# Patient Record
Sex: Female | Born: 1960
Health system: Southern US, Community
[De-identification: ages and names within clinical notes are randomized; demographics above are authoritative.]

## PROBLEM LIST (undated history)

## (undated) DIAGNOSIS — Z9889 Other specified postprocedural states: Secondary | ICD-10-CM

## (undated) DIAGNOSIS — I341 Nonrheumatic mitral (valve) prolapse: Secondary | ICD-10-CM

## (undated) DIAGNOSIS — R112 Nausea with vomiting, unspecified: Secondary | ICD-10-CM

## (undated) DIAGNOSIS — E739 Lactose intolerance, unspecified: Secondary | ICD-10-CM

## (undated) DIAGNOSIS — J309 Allergic rhinitis, unspecified: Secondary | ICD-10-CM

## (undated) DIAGNOSIS — M869 Osteomyelitis, unspecified: Secondary | ICD-10-CM

## (undated) DIAGNOSIS — E7800 Pure hypercholesterolemia, unspecified: Secondary | ICD-10-CM

## (undated) DIAGNOSIS — K219 Gastro-esophageal reflux disease without esophagitis: Secondary | ICD-10-CM

## (undated) DIAGNOSIS — K5900 Constipation, unspecified: Secondary | ICD-10-CM

## (undated) DIAGNOSIS — Z789 Other specified health status: Secondary | ICD-10-CM

## (undated) DIAGNOSIS — R1310 Dysphagia, unspecified: Secondary | ICD-10-CM

## (undated) DIAGNOSIS — D219 Benign neoplasm of connective and other soft tissue, unspecified: Secondary | ICD-10-CM

## (undated) DIAGNOSIS — R079 Chest pain, unspecified: Secondary | ICD-10-CM

## (undated) DIAGNOSIS — T4145XA Adverse effect of unspecified anesthetic, initial encounter: Secondary | ICD-10-CM

## (undated) DIAGNOSIS — R0602 Shortness of breath: Secondary | ICD-10-CM

## (undated) DIAGNOSIS — M7989 Other specified soft tissue disorders: Secondary | ICD-10-CM

## (undated) DIAGNOSIS — T8859XA Other complications of anesthesia, initial encounter: Secondary | ICD-10-CM

## (undated) DIAGNOSIS — M519 Unspecified thoracic, thoracolumbar and lumbosacral intervertebral disc disorder: Secondary | ICD-10-CM

## (undated) DIAGNOSIS — E559 Vitamin D deficiency, unspecified: Secondary | ICD-10-CM

## (undated) DIAGNOSIS — R011 Cardiac murmur, unspecified: Secondary | ICD-10-CM

## (undated) DIAGNOSIS — Z91018 Allergy to other foods: Secondary | ICD-10-CM

## (undated) DIAGNOSIS — G40909 Epilepsy, unspecified, not intractable, without status epilepticus: Secondary | ICD-10-CM

## (undated) DIAGNOSIS — G4700 Insomnia, unspecified: Secondary | ICD-10-CM

## (undated) DIAGNOSIS — D649 Anemia, unspecified: Secondary | ICD-10-CM

## (undated) HISTORY — PX: COLONOSCOPY: SHX174

## (undated) HISTORY — DX: Epilepsy, unspecified, not intractable, without status epilepticus: G40.909

## (undated) HISTORY — DX: Unspecified thoracic, thoracolumbar and lumbosacral intervertebral disc disorder: M51.9

## (undated) HISTORY — PX: DILATION AND CURETTAGE OF UTERUS: SHX78

## (undated) HISTORY — DX: Other specified soft tissue disorders: M79.89

## (undated) HISTORY — DX: Dysphagia, unspecified: R13.10

## (undated) HISTORY — DX: Lactose intolerance, unspecified: E73.9

## (undated) HISTORY — PX: ABDOMINAL HYSTERECTOMY: SHX81

## (undated) HISTORY — DX: Allergy to other foods: Z91.018

## (undated) HISTORY — DX: Constipation, unspecified: K59.00

## (undated) HISTORY — DX: Insomnia, unspecified: G47.00

## (undated) HISTORY — DX: Pure hypercholesterolemia, unspecified: E78.00

## (undated) HISTORY — DX: Chest pain, unspecified: R07.9

## (undated) HISTORY — DX: Anemia, unspecified: D64.9

## (undated) HISTORY — DX: Shortness of breath: R06.02

## (undated) HISTORY — DX: Vitamin D deficiency, unspecified: E55.9

## (undated) HISTORY — PX: HEMORRHOID SURGERY: SHX153

---
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
## 1998-04-11 ENCOUNTER — Ambulatory Visit (HOSPITAL_COMMUNITY): Admission: RE | Admit: 1998-04-11 | Discharge: 1998-04-11 | Payer: Self-pay | Admitting: Gastroenterology

## 1998-05-02 ENCOUNTER — Ambulatory Visit (HOSPITAL_COMMUNITY): Admission: RE | Admit: 1998-05-02 | Discharge: 1998-05-02 | Payer: Self-pay | Admitting: Gastroenterology

## 1998-06-14 ENCOUNTER — Ambulatory Visit (HOSPITAL_COMMUNITY): Admission: RE | Admit: 1998-06-14 | Discharge: 1998-06-15 | Payer: Self-pay | Admitting: General Surgery

## 1998-08-02 ENCOUNTER — Inpatient Hospital Stay (HOSPITAL_COMMUNITY): Admission: EM | Admit: 1998-08-02 | Discharge: 1998-08-04 | Payer: Self-pay | Admitting: Emergency Medicine

## 1998-08-04 ENCOUNTER — Encounter: Payer: Self-pay | Admitting: Emergency Medicine

## 1999-09-06 ENCOUNTER — Other Ambulatory Visit: Admission: RE | Admit: 1999-09-06 | Discharge: 1999-09-06 | Payer: Self-pay | Admitting: *Deleted

## 1999-11-23 ENCOUNTER — Ambulatory Visit (HOSPITAL_COMMUNITY): Admission: RE | Admit: 1999-11-23 | Discharge: 1999-11-23 | Payer: Self-pay | Admitting: *Deleted

## 2000-03-31 ENCOUNTER — Encounter: Payer: Self-pay | Admitting: Neurosurgery

## 2000-03-31 ENCOUNTER — Ambulatory Visit (HOSPITAL_COMMUNITY): Admission: RE | Admit: 2000-03-31 | Discharge: 2000-03-31 | Payer: Self-pay | Admitting: Neurosurgery

## 2000-04-09 ENCOUNTER — Encounter: Admission: RE | Admit: 2000-04-09 | Discharge: 2000-05-30 | Payer: Self-pay | Admitting: Neurosurgery

## 2002-12-16 ENCOUNTER — Encounter: Payer: Self-pay | Admitting: Gastroenterology

## 2002-12-16 ENCOUNTER — Ambulatory Visit (HOSPITAL_COMMUNITY): Admission: RE | Admit: 2002-12-16 | Discharge: 2002-12-16 | Payer: Self-pay | Admitting: Gastroenterology

## 2002-12-18 ENCOUNTER — Ambulatory Visit (HOSPITAL_COMMUNITY): Admission: RE | Admit: 2002-12-18 | Discharge: 2002-12-18 | Payer: Self-pay | Admitting: Gastroenterology

## 2002-12-18 ENCOUNTER — Encounter: Payer: Self-pay | Admitting: Gastroenterology

## 2003-02-24 ENCOUNTER — Ambulatory Visit (HOSPITAL_COMMUNITY): Admission: RE | Admit: 2003-02-24 | Discharge: 2003-02-24 | Payer: Self-pay | Admitting: Family Medicine

## 2003-02-24 ENCOUNTER — Encounter: Payer: Self-pay | Admitting: Family Medicine

## 2003-09-08 ENCOUNTER — Encounter: Admission: RE | Admit: 2003-09-08 | Discharge: 2003-09-08 | Payer: Self-pay | Admitting: General Surgery

## 2004-10-20 ENCOUNTER — Encounter: Admission: RE | Admit: 2004-10-20 | Discharge: 2004-10-20 | Payer: Self-pay | Admitting: General Surgery

## 2005-11-07 ENCOUNTER — Encounter: Admission: RE | Admit: 2005-11-07 | Discharge: 2005-11-07 | Payer: Self-pay | Admitting: General Surgery

## 2005-11-29 ENCOUNTER — Encounter: Admission: RE | Admit: 2005-11-29 | Discharge: 2005-11-29 | Payer: Self-pay | Admitting: General Surgery

## 2005-12-05 ENCOUNTER — Emergency Department (HOSPITAL_COMMUNITY): Admission: EM | Admit: 2005-12-05 | Discharge: 2005-12-05 | Payer: Self-pay | Admitting: Family Medicine

## 2006-02-13 ENCOUNTER — Encounter: Payer: Self-pay | Admitting: Emergency Medicine

## 2006-05-28 ENCOUNTER — Emergency Department (HOSPITAL_COMMUNITY): Admission: AD | Admit: 2006-05-28 | Discharge: 2006-05-28 | Payer: Self-pay | Admitting: Family Medicine

## 2006-12-04 ENCOUNTER — Encounter: Admission: RE | Admit: 2006-12-04 | Discharge: 2006-12-04 | Payer: Self-pay | Admitting: General Surgery

## 2008-01-08 ENCOUNTER — Encounter: Admission: RE | Admit: 2008-01-08 | Discharge: 2008-01-08 | Payer: Self-pay | Admitting: General Surgery

## 2008-03-05 ENCOUNTER — Encounter: Admission: RE | Admit: 2008-03-05 | Discharge: 2008-03-05 | Payer: Self-pay | Admitting: Obstetrics & Gynecology

## 2009-03-15 ENCOUNTER — Encounter: Admission: RE | Admit: 2009-03-15 | Discharge: 2009-03-15 | Payer: Self-pay | Admitting: Obstetrics & Gynecology

## 2010-11-05 ENCOUNTER — Encounter: Payer: Self-pay | Admitting: Obstetrics & Gynecology

## 2011-07-06 ENCOUNTER — Ambulatory Visit: Payer: Self-pay | Admitting: Internal Medicine

## 2011-08-10 ENCOUNTER — Other Ambulatory Visit (HOSPITAL_COMMUNITY): Payer: Self-pay | Admitting: Sports Medicine

## 2011-08-10 DIAGNOSIS — M79671 Pain in right foot: Secondary | ICD-10-CM

## 2011-08-17 ENCOUNTER — Ambulatory Visit (HOSPITAL_COMMUNITY)
Admission: RE | Admit: 2011-08-17 | Discharge: 2011-08-17 | Disposition: A | Discharge status: Home / Self Care | Payer: 59 | Source: Ambulatory Visit | Attending: Sports Medicine | Admitting: Sports Medicine

## 2011-08-17 DIAGNOSIS — M79609 Pain in unspecified limb: Secondary | ICD-10-CM | POA: Insufficient documentation

## 2011-08-17 DIAGNOSIS — M7989 Other specified soft tissue disorders: Secondary | ICD-10-CM | POA: Insufficient documentation

## 2011-08-17 DIAGNOSIS — M79671 Pain in right foot: Secondary | ICD-10-CM

## 2011-09-18 ENCOUNTER — Other Ambulatory Visit (HOSPITAL_COMMUNITY): Payer: Self-pay | Admitting: Orthopedic Surgery

## 2011-09-18 DIAGNOSIS — M25579 Pain in unspecified ankle and joints of unspecified foot: Secondary | ICD-10-CM

## 2011-09-20 ENCOUNTER — Other Ambulatory Visit (HOSPITAL_COMMUNITY): Payer: 59

## 2011-09-24 ENCOUNTER — Ambulatory Visit (HOSPITAL_COMMUNITY)
Admission: RE | Admit: 2011-09-24 | Discharge: 2011-09-24 | Disposition: A | Discharge status: Home / Self Care | Payer: 59 | Source: Ambulatory Visit | Attending: Orthopedic Surgery | Admitting: Orthopedic Surgery

## 2011-09-24 DIAGNOSIS — M25476 Effusion, unspecified foot: Secondary | ICD-10-CM | POA: Insufficient documentation

## 2011-09-24 DIAGNOSIS — M25473 Effusion, unspecified ankle: Secondary | ICD-10-CM | POA: Insufficient documentation

## 2011-09-24 DIAGNOSIS — M25579 Pain in unspecified ankle and joints of unspecified foot: Secondary | ICD-10-CM | POA: Insufficient documentation

## 2011-10-30 ENCOUNTER — Other Ambulatory Visit: Payer: Self-pay | Admitting: Physician Assistant

## 2011-10-30 ENCOUNTER — Encounter (HOSPITAL_BASED_OUTPATIENT_CLINIC_OR_DEPARTMENT_OTHER): Payer: Self-pay | Admitting: *Deleted

## 2011-10-30 NOTE — Progress Notes (Signed)
Used to work or-now telemetry No labs needed

## 2011-10-31 ENCOUNTER — Encounter (HOSPITAL_BASED_OUTPATIENT_CLINIC_OR_DEPARTMENT_OTHER): Payer: Self-pay | Admitting: Anesthesiology

## 2011-10-31 ENCOUNTER — Encounter (HOSPITAL_BASED_OUTPATIENT_CLINIC_OR_DEPARTMENT_OTHER): Payer: Self-pay | Admitting: *Deleted

## 2011-10-31 ENCOUNTER — Ambulatory Visit (HOSPITAL_BASED_OUTPATIENT_CLINIC_OR_DEPARTMENT_OTHER)
Admission: RE | Admit: 2011-10-31 | Discharge: 2011-10-31 | Disposition: A | Discharge status: Home / Self Care | Payer: 59 | Source: Ambulatory Visit | Attending: Orthopedic Surgery | Admitting: Orthopedic Surgery

## 2011-10-31 ENCOUNTER — Encounter (HOSPITAL_BASED_OUTPATIENT_CLINIC_OR_DEPARTMENT_OTHER)
Admission: RE | Disposition: A | Discharge status: Home / Self Care | Payer: Self-pay | Source: Ambulatory Visit | Attending: Orthopedic Surgery

## 2011-10-31 ENCOUNTER — Ambulatory Visit (HOSPITAL_BASED_OUTPATIENT_CLINIC_OR_DEPARTMENT_OTHER): Payer: 59 | Admitting: Anesthesiology

## 2011-10-31 DIAGNOSIS — M25571 Pain in right ankle and joints of right foot: Secondary | ICD-10-CM

## 2011-10-31 DIAGNOSIS — M25879 Other specified joint disorders, unspecified ankle and foot: Secondary | ICD-10-CM | POA: Insufficient documentation

## 2011-10-31 HISTORY — PX: ANKLE ARTHROSCOPY: SHX545

## 2011-10-31 HISTORY — DX: Other specified health status: Z78.9

## 2011-10-31 HISTORY — DX: Other complications of anesthesia, initial encounter: T88.59XA

## 2011-10-31 HISTORY — DX: Nausea with vomiting, unspecified: Z98.890

## 2011-10-31 HISTORY — DX: Nausea with vomiting, unspecified: R11.2

## 2011-10-31 HISTORY — DX: Adverse effect of unspecified anesthetic, initial encounter: T41.45XA

## 2011-10-31 LAB — POCT HEMOGLOBIN-HEMACUE: Hemoglobin: 11.9 g/dL — ABNORMAL LOW (ref 12.0–15.0)

## 2011-10-31 SURGERY — ARTHROSCOPY, ANKLE
Anesthesia: General | Site: Ankle | Laterality: Right | Wound class: Clean

## 2011-10-31 MED ORDER — CEFAZOLIN SODIUM 1-5 GM-% IV SOLN
1.0000 g | INTRAVENOUS | Status: AC
  Administered 2011-10-31: 1 g via INTRAVENOUS

## 2011-10-31 MED ORDER — LIDOCAINE HCL 1.5 % IJ SOLN
INTRAMUSCULAR | Status: DC | PRN
  Administered 2011-10-31: 25 mL via INTRADERMAL

## 2011-10-31 MED ORDER — ONDANSETRON HCL 4 MG/2ML IJ SOLN
INTRAMUSCULAR | Status: DC | PRN
  Administered 2011-10-31: 4 mg via INTRAVENOUS

## 2011-10-31 MED ORDER — ASPIRIN EC 325 MG PO TBEC: 325.0000 mg | DELAYED_RELEASE_TABLET | Freq: Two times a day (BID) | ORAL | Status: AC

## 2011-10-31 MED ORDER — ROPIVACAINE HCL 5 MG/ML IJ SOLN
INTRAMUSCULAR | Status: DC | PRN
  Administered 2011-10-31: 25 mL via EPIDURAL

## 2011-10-31 MED ORDER — MIDAZOLAM HCL 2 MG/2ML IJ SOLN
0.5000 mg | INTRAMUSCULAR | Status: DC | PRN
  Administered 2011-10-31: 2 mg via INTRAVENOUS

## 2011-10-31 MED ORDER — PROPOFOL 10 MG/ML IV EMUL
INTRAVENOUS | Status: DC | PRN
  Administered 2011-10-31: 200 mg via INTRAVENOUS

## 2011-10-31 MED ORDER — MIDAZOLAM HCL 5 MG/5ML IJ SOLN
INTRAMUSCULAR | Status: DC | PRN
  Administered 2011-10-31: 2 mg via INTRAVENOUS

## 2011-10-31 MED ORDER — METOCLOPRAMIDE HCL 5 MG/ML IJ SOLN: 10.0000 mg | Freq: Once | INTRAMUSCULAR | Status: DC | PRN

## 2011-10-31 MED ORDER — FENTANYL CITRATE 0.05 MG/ML IJ SOLN
INTRAMUSCULAR | Status: DC | PRN
  Administered 2011-10-31: 50 ug via INTRAVENOUS

## 2011-10-31 MED ORDER — DROPERIDOL 2.5 MG/ML IJ SOLN
INTRAMUSCULAR | Status: DC | PRN
  Administered 2011-10-31: 0.625 mg via INTRAVENOUS

## 2011-10-31 MED ORDER — SODIUM CHLORIDE 0.9 % IV SOLN: INTRAVENOUS | Status: DC

## 2011-10-31 MED ORDER — FENTANYL CITRATE 0.05 MG/ML IJ SOLN: 25.0000 ug | INTRAMUSCULAR | Status: DC | PRN

## 2011-10-31 MED ORDER — DEXAMETHASONE SODIUM PHOSPHATE 4 MG/ML IJ SOLN
INTRAMUSCULAR | Status: DC | PRN
  Administered 2011-10-31: 10 mg via INTRAVENOUS

## 2011-10-31 MED ORDER — MORPHINE SULFATE 2 MG/ML IJ SOLN: 0.0500 mg/kg | INTRAMUSCULAR | Status: DC | PRN

## 2011-10-31 MED ORDER — CHLORHEXIDINE GLUCONATE 4 % EX LIQD: 60.0000 mL | Freq: Once | CUTANEOUS | Status: DC

## 2011-10-31 MED ORDER — SODIUM CHLORIDE 0.9 % IR SOLN
Status: DC | PRN
  Administered 2011-10-31: 3000 mL

## 2011-10-31 MED ORDER — LIDOCAINE HCL (CARDIAC) 20 MG/ML IV SOLN
INTRAVENOUS | Status: DC | PRN
  Administered 2011-10-31: 40 mg via INTRAVENOUS

## 2011-10-31 MED ORDER — HYDROMORPHONE HCL 2 MG PO TABS: 2.0000 mg | ORAL_TABLET | ORAL | Status: AC | PRN

## 2011-10-31 MED ORDER — FAMOTIDINE 20 MG PO TABS
20.0000 mg | ORAL_TABLET | Freq: Once | ORAL | Status: AC
  Administered 2011-10-31: 20 mg via ORAL

## 2011-10-31 MED ORDER — LACTATED RINGERS IV SOLN
INTRAVENOUS | Status: DC
  Administered 2011-10-31 (×2): via INTRAVENOUS

## 2011-10-31 MED ORDER — VITAMIN C 500 MG PO TABS: 500.0000 mg | ORAL_TABLET | Freq: Every day | ORAL | Status: AC

## 2011-10-31 MED ORDER — METHOCARBAMOL 500 MG PO TABS: 500.0000 mg | ORAL_TABLET | Freq: Three times a day (TID) | ORAL | Status: AC

## 2011-10-31 MED ORDER — FENTANYL CITRATE 0.05 MG/ML IJ SOLN
50.0000 ug | INTRAMUSCULAR | Status: DC | PRN
  Administered 2011-10-31: 100 ug via INTRAVENOUS

## 2011-10-31 MED ORDER — LIDOCAINE HCL 1 % IJ SOLN
INTRAMUSCULAR | Status: DC | PRN
  Administered 2011-10-31: 2 mL via INTRADERMAL

## 2011-10-31 SURGICAL SUPPLY — 63 items
BANDAGE ACE 4 STERILE (GAUZE/BANDAGES/DRESSINGS) ×2 IMPLANT
BANDAGE ELASTIC 4 VELCRO ST LF (GAUZE/BANDAGES/DRESSINGS) IMPLANT
BANDAGE ELASTIC 6 VELCRO ST LF (GAUZE/BANDAGES/DRESSINGS) ×2 IMPLANT
BANDAGE GAUZE ELAST BULKY 4 IN (GAUZE/BANDAGES/DRESSINGS) IMPLANT
BLADE CUDA 2.0 (BLADE) IMPLANT
BLADE CUDA SHAVER 3.5 (BLADE) IMPLANT
BLADE SURG 15 STRL LF DISP TIS (BLADE) IMPLANT
BLADE SURG 15 STRL SS (BLADE)
BRUSH SCRUB EZ PLAIN DRY (MISCELLANEOUS) ×2 IMPLANT
BUR 3.5 LG SPHERICAL (BURR) IMPLANT
BUR CUDA 2.9 (BURR) IMPLANT
BUR GATOR 2.9 (BURR) IMPLANT
BUR OVAL 4.0 (BURR) IMPLANT
BUR SPHERICAL 2.9 (BURR) IMPLANT
BURR 3.5 LG SPHERICAL (BURR)
CANISTER OMNI JUG 16 LITER (MISCELLANEOUS) IMPLANT
CANISTER SUCTION 2500CC (MISCELLANEOUS) ×2 IMPLANT
CLOTH BEACON ORANGE TIMEOUT ST (SAFETY) ×2 IMPLANT
CUFF TOURNIQUET SINGLE 34IN LL (TOURNIQUET CUFF) IMPLANT
DRAPE ARTHROSCOPY W/POUCH 114 (DRAPES) ×2 IMPLANT
DRAPE OEC MINIVIEW 54X84 (DRAPES) IMPLANT
DRAPE SURG 17X23 STRL (DRAPES) ×2 IMPLANT
DRSG XEROFORM 1X8 (GAUZE/BANDAGES/DRESSINGS) ×2 IMPLANT
DURA STEPPER LG (CAST SUPPLIES) IMPLANT
DURA STEPPER MED (CAST SUPPLIES) ×2 IMPLANT
DURA STEPPER SML (CAST SUPPLIES) IMPLANT
ELECT REM PT RETURN 9FT ADLT (ELECTROSURGICAL) ×2
ELECTRODE REM PT RTRN 9FT ADLT (ELECTROSURGICAL) ×1 IMPLANT
GAUZE XEROFORM 1X8 LF (GAUZE/BANDAGES/DRESSINGS) ×2 IMPLANT
GLOVE BIO SURGEON STRL SZ 6.5 (GLOVE) ×2 IMPLANT
GLOVE BIO SURGEON STRL SZ7.5 (GLOVE) ×2 IMPLANT
GLOVE BIO SURGEON STRL SZ8 (GLOVE) ×2 IMPLANT
GLOVE BIOGEL PI IND STRL 8 (GLOVE) ×2 IMPLANT
GLOVE BIOGEL PI INDICATOR 8 (GLOVE) ×2
GLOVE INDICATOR 7.0 STRL GRN (GLOVE) ×2 IMPLANT
GLOVE SURG SS PI 8.0 STRL IVOR (GLOVE) ×2 IMPLANT
GOWN BRE IMP PREV XXLGXLNG (GOWN DISPOSABLE) ×2 IMPLANT
GOWN PREVENTION PLUS XLARGE (GOWN DISPOSABLE) ×4 IMPLANT
NS IRRIG 1000ML POUR BTL (IV SOLUTION) IMPLANT
PACK ARTHROSCOPY DSU (CUSTOM PROCEDURE TRAY) ×2 IMPLANT
PACK BASIN DAY SURGERY FS (CUSTOM PROCEDURE TRAY) ×2 IMPLANT
PADDING CAST ABS 4INX4YD NS (CAST SUPPLIES) ×1
PADDING CAST ABS COTTON 4X4 ST (CAST SUPPLIES) ×1 IMPLANT
PADDING WEBRIL 4 STERILE (GAUZE/BANDAGES/DRESSINGS) ×2 IMPLANT
PADDING WEBRIL 6 STERILE (GAUZE/BANDAGES/DRESSINGS) ×2 IMPLANT
PENCIL BUTTON HOLSTER BLD 10FT (ELECTRODE) IMPLANT
SPONGE GAUZE 4X4 12PLY (GAUZE/BANDAGES/DRESSINGS) ×2 IMPLANT
STOCKINETTE 6  STRL (DRAPES) ×1
STOCKINETTE 6 STRL (DRAPES) ×1 IMPLANT
STRAP ANKLE FOOT DISTRACTOR (ORTHOPEDIC SUPPLIES) IMPLANT
SUCTION FRAZIER TIP 10 FR DISP (SUCTIONS) IMPLANT
SUT ETHILON 4 0 PS 2 18 (SUTURE) ×2 IMPLANT
SUT VIC AB 3-0 PS1 18 (SUTURE)
SUT VIC AB 3-0 PS1 18XBRD (SUTURE) IMPLANT
SYR BULB 3OZ (MISCELLANEOUS) IMPLANT
TOWEL OR 17X24 6PK STRL BLUE (TOWEL DISPOSABLE) ×2 IMPLANT
TOWEL OR NON WOVEN STRL DISP B (DISPOSABLE) ×2 IMPLANT
TUBE CONNECTING 20X1/4 (TUBING) ×4 IMPLANT
TUBING ARTHROSCOPY IRRIG 16FT (MISCELLANEOUS) ×2 IMPLANT
UNDERPAD 30X30 INCONTINENT (UNDERPADS AND DIAPERS) ×2 IMPLANT
WAND 1.5 MICROBLATOR (SURGICAL WAND) IMPLANT
WAND SHORT BEVEL W/CORD (SURGICAL WAND) ×2 IMPLANT
WATER STERILE IRR 1000ML POUR (IV SOLUTION) ×2 IMPLANT

## 2011-10-31 NOTE — Brief Op Note (Signed)
10/31/2011  2:39 PM  PATIENT:  Jillian Andrade  51 y.o. female  PRE-OPERATIVE DIAGNOSIS:  RIGHT ANTERIOR LATERAL ANKLE IMPINGEMENT  POST-OPERATIVE DIAGNOSIS:  RIGHT ANTERIOR LATERAL ANKLEIMPINGEMENT  PROCEDURE:  Procedure(s): ANKLE ARTHROSCOPY  SURGEON:  Surgeon(s): Sherri Rad, MD  PHYSICIAN ASSISTANT: Rexene Edison, PAC  ASSISTANTS: Above   ANESTHESIA:   general  EBL:  Total I/O In: 500 [I.V.:500] Out: -   BLOOD ADMINISTERED:none  DRAINS: none   LOCAL MEDICATIONS USED:  NONE  SPECIMEN:  No Specimen  DISPOSITION OF SPECIMEN:  N/A  COUNTS:  YES  TOURNIQUET:  * Missing tourniquet times found for documented tourniquets in log:  [id number] *  DICTATION: .Other Dictation: Dictation Number [id number]  PLAN OF CARE: Discharge to home after PACU  PATIENT DISPOSITION:  PACU - hemodynamically stable.   Delay start of Pharmacological VTE agent (>24hrs) due to surgical blood loss or risk of bleeding:  {YES/NO/NOT APPLICABLE:20182

## 2011-10-31 NOTE — Anesthesia Postprocedure Evaluation (Signed)
  Anesthesia Post-op Note  Patient: Jillian Andrade  Procedure(s) Performed:  ANKLE ARTHROSCOPY - RIGHT ANKLE ARTHROSCOPY WITH EXTENSIVE DEBRIDEMENT  Patient Location: PACU  Anesthesia Type: GA combined with regional for post-op pain  Level of Consciousness: awake, alert  and oriented  Airway and Oxygen Therapy: Patient Spontanous Breathing  Post-op Pain: none  Post-op Assessment: Post-op Vital signs reviewed, Patient's Cardiovascular Status Stable, Respiratory Function Stable, Patent Airway, No signs of Nausea or vomiting and Pain level controlled  Post-op Vital Signs: Reviewed and stable  Complications: No apparent anesthesia complications

## 2011-10-31 NOTE — H&P (Signed)
  H&P documentation: Placed to be scanned history and physical exam in chart.  -History and Physical Reviewed  -Patient has been re-examined  -No change in the plan of care  Jillian Andrade A  

## 2011-10-31 NOTE — Anesthesia Preprocedure Evaluation (Signed)
Anesthesia Evaluation  Patient identified by MRN, date of birth, ID band Patient awake    Reviewed: Allergy & Precautions, H&P , NPO status , Patient's Chart, lab work & pertinent test results, reviewed documented beta blocker date and time   History of Anesthesia Complications (+) PONV  Airway Mallampati: II TM Distance: >3 FB Neck ROM: full    Dental   Pulmonary neg pulmonary ROS,          Cardiovascular neg cardio ROS     Neuro/Psych Negative Neurological ROS  Negative Psych ROS   GI/Hepatic negative GI ROS, Neg liver ROS,   Endo/Other  Negative Endocrine ROS  Renal/GU negative Renal ROS  Genitourinary negative   Musculoskeletal   Abdominal   Peds  Hematology negative hematology ROS (+)   Anesthesia Other Findings See surgeon's H&P   Reproductive/Obstetrics negative OB ROS                           Anesthesia Physical Anesthesia Plan  ASA: I  Anesthesia Plan: General   Post-op Pain Management: MAC Combined w/ Regional for Post-op pain   Induction: Intravenous  Airway Management Planned: LMA  Additional Equipment:   Intra-op Plan:   Post-operative Plan: Extubation in OR  Informed Consent: I have reviewed the patients History and Physical, chart, labs and discussed the procedure including the risks, benefits and alternatives for the proposed anesthesia with the patient or authorized representative who has indicated his/her understanding and acceptance.     Plan Discussed with: CRNA and Surgeon  Anesthesia Plan Comments:         Anesthesia Quick Evaluation

## 2011-10-31 NOTE — Progress Notes (Signed)
Assisted Dr. Frederick with right, ultrasound guided, popliteal/saphenous block. Side rails up, monitors on throughout procedure. See vital signs in flow sheet. Tolerated Procedure well. 

## 2011-10-31 NOTE — Transfer of Care (Signed)
Immediate Anesthesia Transfer of Care Note  Patient: Jillian Andrade  Procedure(s) Performed:  ANKLE ARTHROSCOPY - RIGHT ANKLE ARTHROSCOPY WITH EXTENSIVE DEBRIDEMENT  Patient Location: PACU  Anesthesia Type: GA combined with regional for post-op pain  Level of Consciousness: sedated  Airway & Oxygen Therapy: Patient Spontanous Breathing and Patient connected to face mask oxygen  Post-op Assessment: Report given to PACU RN and Post -op Vital signs reviewed and stable  Post vital signs: Reviewed and stable Filed Vitals:   10/31/11 1300  BP: 111/55  Pulse: 68  Temp:   Resp: 13    Complications: No apparent anesthesia complications

## 2011-10-31 NOTE — Anesthesia Procedure Notes (Addendum)
Anesthesia Regional Block:  Popliteal block  Pre-Anesthetic Checklist: ,, timeout performed, Correct Patient, Correct Site, Correct Laterality, Correct Procedure, Correct Position, site marked, Risks and benefits discussed,  Surgical consent,  Pre-op evaluation,  At surgeon's request and post-op pain management  Laterality: Right  Prep: chloraprep       Needles:   Needle Type: Other   (Arrow Echogenic)   Needle Length: 9cm  Needle Gauge: 21    Additional Needles:  Procedures: ultrasound guided Popliteal block Narrative:  Start time: 10/31/2011 12:49 PM End time: 10/31/2011 12:57 PM Injection made incrementally with aspirations every 5 mL.  Performed by: Personally  Anesthesiologist: Aldona Lento, MD  Additional Notes: Ultrasound guidance used to: id relevant anatomy, confirm needle position, local anesthetic spread, avoidance of vascular puncture. Picture saved. No complications. Block performed personally by Janetta Hora. Gelene Mink, MD  .    Popliteal block Procedure Name: LMA Insertion Date/Time: 10/31/2011 2:00 PM Performed by: Jearld Shines Pre-anesthesia Checklist: Patient identified, Emergency Drugs available, Suction available and Patient being monitored Patient Re-evaluated:Patient Re-evaluated prior to inductionOxygen Delivery Method: Circle System Utilized Preoxygenation: Pre-oxygenation with 100% oxygen Intubation Type: IV induction Ventilation: Mask ventilation without difficulty LMA: LMA with gastric port inserted LMA Size: 4.0 Number of attempts: 1 Airway Equipment and Method: bite block Placement Confirmation: positive ETCO2 and breath sounds checked- equal and bilateral Tube secured with: Tape Dental Injury: Teeth and Oropharynx as per pre-operative assessment

## 2011-11-01 ENCOUNTER — Encounter (HOSPITAL_BASED_OUTPATIENT_CLINIC_OR_DEPARTMENT_OTHER): Payer: Self-pay | Admitting: Orthopedic Surgery

## 2011-11-01 NOTE — Op Note (Signed)
NAME:  Jillian Andrade, Jillian Andrade                  ACCOUNT NO.:  MEDICAL RECORD NO.:  [id number]  LOCATION:                                 FACILITY:  PHYSICIAN:  Leonides Grills, M.D.     DATE OF BIRTH:  1961-07-19  DATE OF PROCEDURE:  10/31/2011 DATE OF DISCHARGE:                              OPERATIVE REPORT   PREOPERATIVE DIAGNOSIS:  Right anterior and anterolateral ankle impingement.  POSTOPERATIVE DIAGNOSIS:  Right anterior and anterolateral ankle impingement.  OPERATION:  Right ankle arthroscopy with extensive debridement.  ANESTHESIA:  General.  SURGEON:  Leonides Grills, M.D.  ASSISTANT:  Richardean Canal, P.A.  ESTIMATED BLOOD LOSS:  Minimal.  TOURNIQUET TIME:  None.  COMPLICATIONS:  None.  DISPOSITION:  Stable to PR.  First assistant was used to aid in positioning, manipulation of the ankle while arthroscopy was being performed, closure and dressing the application.  INDICATION:  This is a 51 year old female who has had persistent anterior and anterolateral ankle pain that was interfering with her life despite conservative management.  She is followed by physical exam and MRI to have pain over the accessory tib-fib ligament.  It was thought that she had an ankle impingement.  She was consented for the above procedure.  All risks, infection, nerve or vessel injury, persistent pain, worse pain, prolonged recovery, stiffness, arthritis, wound healing problems, DVT, PE were all explained.  Questions were encouraged and answered.  OPERATION:  The patient was brought to the operating room, placed in supine position.  After adequate general anesthesia was administered as well as Ancef 1 g IV piggyback.  Bumps placed on the right ipsilateral hip slightly internally rotating the right lower extremity.  All bony prominences were well padded.  Right lower extremity was prepped and draped in sterile manner.  Anatomical landmarks included anterior tibialis tendon, peroneus tertius  and superficial peroneal nerve could not be seen.  The spinal needle was then placed just medial to the anterior tibialis tendon.  A 20 mL of normal saline was instilled in the ankle.  A nick-and-spread technique was then utilized to create the anteromedial portal, medial to the anterior tibialis tendon.  Blunt-tip trocar with cannula followed by camera was then placed in the ankle. Under direct visualization, anterolateral portal was created with a spinal needle followed by nick-and-spread technique lateral to the peroneus tertius tendon.  There was a large amount of synovitis in the anterolateral aspect of the ankle.  There was a robust accessory tib-fib ligament with wear on its under surface and synovitis surrounding it. With dorsiflexion of the ankle, this impinged to the ligament as well. Then with this radiofrequency bevel and extensive debridement was performed laterally to the ligament excision, but also synovitis in the area that extended into the anterolateral gutter as well.  There were no obvious osteochondral lesions.  Pictures were obtained throughout the procedure.  Camera was then placed anterolaterally visualizing anteromedially.  Again, there was some synovitis anteromedially that extended into the anteromedial gutter.  This was also debrided with a radiofrequency bevel as well.  There were again no obvious osteochondral lesions.  The ankle was ranged, there was no impingement.  Pictures were  obtained throughout the procedure.  Camera was removed.  Wound was closed with 4-0 nylon stitch.  Sterile dressing was applied.  CAM walker boot was applied.  The patient was stable to PR.     Leonides Grills, M.D.     PB/MEDQ  D:  10/31/2011  T:  11/01/2011  Job:  [id number]

## 2011-11-16 ENCOUNTER — Ambulatory Visit: Payer: 59 | Attending: Orthopedic Surgery | Admitting: Physical Therapy

## 2011-11-16 DIAGNOSIS — IMO0001 Reserved for inherently not codable concepts without codable children: Secondary | ICD-10-CM | POA: Insufficient documentation

## 2011-11-16 DIAGNOSIS — R262 Difficulty in walking, not elsewhere classified: Secondary | ICD-10-CM | POA: Insufficient documentation

## 2011-11-16 DIAGNOSIS — M25579 Pain in unspecified ankle and joints of unspecified foot: Secondary | ICD-10-CM | POA: Insufficient documentation

## 2011-11-20 ENCOUNTER — Ambulatory Visit: Payer: 59 | Admitting: Physical Therapy

## 2011-11-21 ENCOUNTER — Ambulatory Visit: Payer: 59 | Admitting: Physical Therapy

## 2011-11-26 ENCOUNTER — Ambulatory Visit: Payer: 59 | Admitting: Physical Therapy

## 2011-11-29 ENCOUNTER — Ambulatory Visit: Payer: 59 | Admitting: Physical Therapy

## 2011-12-03 ENCOUNTER — Ambulatory Visit: Payer: 59 | Admitting: Physical Therapy

## 2011-12-07 ENCOUNTER — Encounter: Payer: 59 | Admitting: Physical Therapy

## 2011-12-10 ENCOUNTER — Encounter: Payer: 59 | Admitting: Physical Therapy

## 2011-12-14 ENCOUNTER — Encounter: Payer: 59 | Admitting: Physical Therapy

## 2012-01-23 ENCOUNTER — Other Ambulatory Visit: Payer: Self-pay | Admitting: Obstetrics & Gynecology

## 2012-01-23 DIAGNOSIS — Z1231 Encounter for screening mammogram for malignant neoplasm of breast: Secondary | ICD-10-CM

## 2012-02-04 ENCOUNTER — Ambulatory Visit: Payer: 59

## 2012-02-11 ENCOUNTER — Ambulatory Visit
Admission: RE | Admit: 2012-02-11 | Discharge: 2012-02-11 | Disposition: A | Discharge status: Home / Self Care | Payer: 59 | Source: Ambulatory Visit | Attending: Obstetrics & Gynecology | Admitting: Obstetrics & Gynecology

## 2012-02-11 DIAGNOSIS — Z1231 Encounter for screening mammogram for malignant neoplasm of breast: Secondary | ICD-10-CM

## 2012-08-20 ENCOUNTER — Emergency Department (HOSPITAL_COMMUNITY)
Admission: EM | Admit: 2012-08-20 | Discharge: 2012-08-20 | Disposition: A | Discharge status: Home / Self Care | Payer: 59 | Source: Home / Self Care

## 2012-08-20 ENCOUNTER — Encounter (HOSPITAL_COMMUNITY): Payer: Self-pay

## 2012-08-20 DIAGNOSIS — J069 Acute upper respiratory infection, unspecified: Secondary | ICD-10-CM

## 2012-08-20 MED ORDER — GUAIFENESIN-CODEINE 100-10 MG/5ML PO SYRP: ORAL_SOLUTION | ORAL | Status: DC

## 2012-08-20 NOTE — ED Provider Notes (Signed)
Medical screening examination/treatment/procedure(s) were performed by non-physician practitioner and as supervising physician I was immediately available for consultation/collaboration.  Raynald Blend, MD 08/20/12 1310

## 2012-08-20 NOTE — ED Notes (Signed)
C/o cough (productive-yellow in color), chest sore from coughing, chills and body aches, started approx 1 week

## 2012-08-20 NOTE — ED Provider Notes (Signed)
History     CSN: [id number]  Arrival date & time 08/20/12  0949   None     Chief Complaint  Patient presents with  . URI    (Consider location/radiation/quality/duration/timing/severity/associated sxs/prior treatment) HPI Comments: 51 year old female who presents with URI symptoms. She complains of cough, PND, nasal stuffiness, sore throat, tightness in the chest and sometimes shortness of breath. Denies earache or GI symptoms.   Past Medical History  Diagnosis Date  . Complication of anesthesia     n/v  . PONV (postoperative nausea and vomiting)   . No pertinent past medical history     Past Surgical History  Procedure Date  . Abdominal hysterectomy   . Dilation and curettage of uterus   . Hemorrhoid surgery   . Colonoscopy   . Ankle arthroscopy 10/31/2011    Procedure: ANKLE ARTHROSCOPY;  Surgeon: Sherri Rad, MD;  Location: [facility];  Service: Orthopedics;  Laterality: Right;  RIGHT ANKLE ARTHROSCOPY WITH EXTENSIVE DEBRIDEMENT    No family history on file.  History  Substance Use Topics  . Smoking status: Never Smoker   . Smokeless tobacco: Not on file  . Alcohol Use: No    OB History    Grav Para Term Preterm Abortions TAB SAB Ect Mult Living                  Review of Systems  Constitutional: Negative for fever, chills, activity change, appetite change and fatigue.  HENT: Positive for congestion, sore throat, rhinorrhea, voice change and postnasal drip. Negative for ear pain, facial swelling, trouble swallowing, neck pain, neck stiffness and ear discharge.   Eyes: Negative.   Respiratory: Negative.   Cardiovascular: Negative.   Gastrointestinal: Negative.   Musculoskeletal: Negative.   Skin: Negative for pallor and rash.  Neurological: Negative.     Allergies  Oxycodone and Wygesic  Home Medications   Current Outpatient Rx  Name  Route  Sig  Dispense  Refill  . IBUPROFEN 200 MG PO TABS   Oral   Take 200 mg by mouth every  6 (six) hours as needed.         Marland Kitchen VITAMIN D (CHOLECALCIFEROL) PO   Oral   Take by mouth daily.         . GUAIFENESIN-CODEINE 100-10 MG/5ML PO SYRP      5 to 10 ml po q 4 hours prn cough   120 mL   0   . VITAMIN C 500 MG PO TABS   Oral   Take 1 tablet (500 mg total) by mouth daily.   90 tablet   0     BP 142/89  Pulse 65  Temp 99 F (37.2 C) (Oral)  Resp 16  SpO2 97%  Physical Exam  Constitutional: She is oriented to person, place, and time. She appears well-developed and well-nourished. No distress.  HENT:  Right Ear: External ear normal.  Left Ear: External ear normal.  Mouth/Throat: Oropharynx is clear and moist. No oropharyngeal exudate.  Eyes: Conjunctivae normal and EOM are normal.  Neck: Normal range of motion. Neck supple.  Cardiovascular: Normal rate and regular rhythm.   Pulmonary/Chest: Effort normal and breath sounds normal. No respiratory distress. She has no wheezes. She has no rales.  Musculoskeletal: Normal range of motion. She exhibits no edema.  Lymphadenopathy:    She has no cervical adenopathy.  Neurological: She is alert and oriented to person, place, and time.  Skin: Skin is warm and dry.  No rash noted.  Psychiatric: She has a normal mood and affect.    ED Course  Procedures (including critical care time)  Labs Reviewed - No data to display No results found.   1. URI (upper respiratory infection)       MDM  Continue the NyQuil as directed when necessary Cheratussin 1-2 teaspoons every 4 hours when necessary cough Drink plenty of fluids stay well hydrated  Vitamin C, chicken soup.         Hayden Rasmussen, NP 08/20/12 1304

## 2013-08-12 ENCOUNTER — Other Ambulatory Visit: Payer: Self-pay

## 2013-08-12 DIAGNOSIS — Z1231 Encounter for screening mammogram for malignant neoplasm of breast: Secondary | ICD-10-CM

## 2013-09-18 ENCOUNTER — Ambulatory Visit
Admission: RE | Admit: 2013-09-18 | Discharge: 2013-09-18 | Disposition: A | Discharge status: Home / Self Care | Payer: 59 | Source: Ambulatory Visit

## 2013-09-18 DIAGNOSIS — Z1231 Encounter for screening mammogram for malignant neoplasm of breast: Secondary | ICD-10-CM

## 2013-12-22 ENCOUNTER — Telehealth: Payer: Self-pay | Admitting: Interventional Cardiology

## 2013-12-22 NOTE — Telephone Encounter (Signed)
returned call to pt dentist.adv her that I was unable to  locate pt in [facility].she sts that pt told her that she saw Dr.Smith a long time ago.no info in epic.cindy verbalized understanding and will f/u with pt

## 2013-12-22 NOTE — Telephone Encounter (Signed)
New message    Office calling what condition does patient have that would interfere with her dental work.  Does patient need to have pre-med ?

## 2014-02-08 ENCOUNTER — Encounter (HOSPITAL_COMMUNITY): Payer: Self-pay | Admitting: Pharmacy Technician

## 2014-02-18 ENCOUNTER — Encounter (HOSPITAL_COMMUNITY): Payer: Self-pay | Admitting: *Deleted

## 2014-03-01 ENCOUNTER — Other Ambulatory Visit: Payer: Self-pay | Admitting: Gastroenterology

## 2014-03-02 ENCOUNTER — Encounter (HOSPITAL_COMMUNITY): Payer: 59 | Admitting: Anesthesiology

## 2014-03-02 ENCOUNTER — Ambulatory Visit (HOSPITAL_COMMUNITY)
Admission: RE | Admit: 2014-03-02 | Discharge: 2014-03-02 | Disposition: A | Discharge status: Home / Self Care | Payer: 59 | Source: Ambulatory Visit | Attending: Gastroenterology | Admitting: Gastroenterology

## 2014-03-02 ENCOUNTER — Encounter (HOSPITAL_COMMUNITY): Payer: Self-pay

## 2014-03-02 ENCOUNTER — Ambulatory Visit (HOSPITAL_COMMUNITY): Payer: 59 | Admitting: Anesthesiology

## 2014-03-02 ENCOUNTER — Encounter (HOSPITAL_COMMUNITY)
Admission: RE | Disposition: A | Discharge status: Home / Self Care | Payer: Self-pay | Source: Ambulatory Visit | Attending: Gastroenterology

## 2014-03-02 DIAGNOSIS — Z91038 Other insect allergy status: Secondary | ICD-10-CM | POA: Insufficient documentation

## 2014-03-02 DIAGNOSIS — Z1211 Encounter for screening for malignant neoplasm of colon: Secondary | ICD-10-CM | POA: Insufficient documentation

## 2014-03-02 DIAGNOSIS — Z91018 Allergy to other foods: Secondary | ICD-10-CM | POA: Insufficient documentation

## 2014-03-02 DIAGNOSIS — Z91012 Allergy to eggs: Secondary | ICD-10-CM | POA: Insufficient documentation

## 2014-03-02 DIAGNOSIS — Z885 Allergy status to narcotic agent status: Secondary | ICD-10-CM | POA: Insufficient documentation

## 2014-03-02 HISTORY — DX: Benign neoplasm of connective and other soft tissue, unspecified: D21.9

## 2014-03-02 HISTORY — PX: COLONOSCOPY: SHX5424

## 2014-03-02 HISTORY — DX: Nonrheumatic mitral (valve) prolapse: I34.1

## 2014-03-02 SURGERY — COLONOSCOPY
Anesthesia: Monitor Anesthesia Care

## 2014-03-02 MED ORDER — KETAMINE HCL 10 MG/ML IJ SOLN
INTRAMUSCULAR | Status: DC | PRN
  Administered 2014-03-02: 25 mg via INTRAVENOUS

## 2014-03-02 MED ORDER — MIDAZOLAM HCL 2 MG/2ML IJ SOLN
INTRAMUSCULAR | Status: AC
  Filled 2014-03-02: qty 2

## 2014-03-02 MED ORDER — LACTATED RINGERS IV SOLN
INTRAVENOUS | Status: DC
  Administered 2014-03-02: 08:00:00 via INTRAVENOUS

## 2014-03-02 MED ORDER — KETAMINE HCL 10 MG/ML IJ SOLN
INTRAMUSCULAR | Status: AC
  Filled 2014-03-02: qty 1

## 2014-03-02 MED ORDER — SODIUM CHLORIDE 0.9 % IV SOLN: INTRAVENOUS | Status: DC

## 2014-03-02 MED ORDER — PROPOFOL 10 MG/ML IV BOLUS
INTRAVENOUS | Status: AC
  Filled 2014-03-02: qty 20

## 2014-03-02 MED ORDER — FENTANYL CITRATE 0.05 MG/ML IJ SOLN
INTRAMUSCULAR | Status: AC
  Filled 2014-03-02: qty 2

## 2014-03-02 MED ORDER — MIDAZOLAM HCL 5 MG/5ML IJ SOLN
INTRAMUSCULAR | Status: DC | PRN
  Administered 2014-03-02 (×2): 1 mg via INTRAVENOUS

## 2014-03-02 MED ORDER — ONDANSETRON HCL 4 MG/2ML IJ SOLN
INTRAMUSCULAR | Status: DC | PRN
Start: 2014-03-02 — End: 2014-03-02
  Administered 2014-03-02: 4 mg via INTRAVENOUS

## 2014-03-02 MED ORDER — PROPOFOL INFUSION 10 MG/ML OPTIME
INTRAVENOUS | Status: DC | PRN
  Administered 2014-03-02: 140 ug/kg/min via INTRAVENOUS

## 2014-03-02 MED ORDER — ONDANSETRON HCL 4 MG/2ML IJ SOLN
INTRAMUSCULAR | Status: AC
  Filled 2014-03-02: qty 2

## 2014-03-02 MED ORDER — FENTANYL CITRATE 0.05 MG/ML IJ SOLN
INTRAMUSCULAR | Status: DC | PRN
  Administered 2014-03-02 (×2): 50 ug via INTRAVENOUS

## 2014-03-02 NOTE — Op Note (Signed)
[facility] [place], [id number]   COLONOSCOPY PROCEDURE REPORT  PATIENT: Jillian Andrade, Jillian Andrade  MR#: [id number] BIRTHDATE: 1961/01/04 , 52  yrs. old GENDER: Female ENDOSCOPIST: Ronald Lobo, MD REFERRED BY:   Willey Blade, M.D. PROCEDURE DATE:  03/02/2014 PROCEDURE:     colonoscopy with biopsy ASA CLASS: INDICATIONS:  standard risk screening in a 53 year old female. Approximately 18 years ago, and attempted colonoscopy for evaluation of rectal bleeding was unsuccessful; the exam was incomplete due to to colonic anatomy  MEDICATIONS:   MAC, per anesthesia, including propofol, ketamine, fentanyl, and Versed  DESCRIPTION OF PROCEDURE: the patient came as an outpatient to the [facility] long endoscopy unit and provided written consent. Time out was performed and she received MAC anesthesia without any clinical instability.  The Pentax ultraslim colonoscope was used for this procedure and was advanced without significant difficulty to the terminal ileum, using brief external abdominal compression to control looping.  The terminal ileum looked normal.  The colon had a fairly good prep. There was minimal particulate material present, but quite a bit of residual "pea soup" liquid stool as well as some stool film coating the mucosa. However, by a combination of irrigation and suction, it is felt that all areas of the colon were well seen.  In the proximal ascending colon, there was a roughly 1.5 cm patch of slightly verrucous mucosa, possibly representing an early flat polyp, so biopsies were obtained from that area, largely excising the abnormal tissue.  The colon was otherwise normal. No other polyps were seen, nor did I encounter any masses, colitis, vascular abnormalities, or diverticulosis.  Retroflexion in the rectum and reinspection of the rectum were normal.  The patient tolerated the procedure  well     COMPLICATIONS: None  ENDOSCOPIC IMPRESSION:  1. Possible flat polyp in proximal ascending colon, biopsied as described above  2. Otherwise normal screening colonoscopy in a "standard risk" individual without worrisome symptoms  RECOMMENDATIONS:  1. Await pathology on polyp. If adenomatous in character, consider followup colonoscopy in 3-5 years, taking into account that not all tissue of this flat lesion was necessarily excised. If the polyp tissue is not adenomatous in character, the patient can go 10 years until her next exam   _______________________________ eSigned:  Ronald Lobo, MD 03/02/2014 9:14 AM     PATIENT NAME:  Jillian Andrade, Jillian Andrade MR#: [id number]

## 2014-03-02 NOTE — H&P (Signed)
Jillian Andrade is an 53 y.o. female.   Chief Complaint: Need for colon cancer screening HPI: This is a "standard risk" individual who presents for colon cancer screening at age 10. About 20 years ago, an attempt at colonoscopy was unsuccessful due to an angulation of the colon which prevented passage of the scope. She has no worrisome lower GI tract symptoms at this time  Past Medical History  Diagnosis Date  . Complication of anesthesia     n/v  . PONV (postoperative nausea and vomiting)   . No pertinent past medical history   . MVP (mitral valve prolapse) mild, no cardiologist  . Fibroids in ovaries     pain in left side    Past Surgical History  Procedure Laterality Date  . Abdominal hysterectomy    . Dilation and curettage of uterus    . Hemorrhoid surgery    . Colonoscopy    . Ankle arthroscopy  10/31/2011    Procedure: ANKLE ARTHROSCOPY;  Surgeon: Jillian Rhein, MD;  Location: [place];  Service: Orthopedics;  Laterality: Right;  RIGHT ANKLE ARTHROSCOPY WITH EXTENSIVE DEBRIDEMENT    History reviewed. No pertinent family history. Social History:  reports that she has never smoked. She has never used smokeless tobacco. She reports that she does not drink alcohol or use illicit drugs.  Allergies:  Allergies  Allergen Reactions  . Bee Venom Anaphylaxis  . Eggs Or Egg-Derived Products Nausea And Vomiting  . Orange Juice [Orange Oil] Hives and Nausea And Vomiting  . Oxycodone Hives  . Wygesic [Propoxyphene N-Acetaminophen] Nausea And Vomiting    Medications Prior to Admission  Medication Sig Dispense Refill  . ibuprofen (ADVIL,MOTRIN) 200 MG tablet Take 800 mg by mouth every 6 (six) hours as needed for mild pain.        No results found for this or any previous visit (from the past 48 hour(s)). No results found.  ROS negative  Blood pressure 119/68, pulse 64, temperature 98.7 F (37.1 C), temperature source Oral, resp. rate 14, height 5\' 5"  (1.651  m), weight 77.111 kg (170 lb), SpO2 100.00%. Physical Exam a moderately overweight but healthy-appearing African American female who does not come across as being anxious or depressed. She is anicteric and without pallor. Chest is clear. Heart is without murmurs or arrhythmias. Abdomen is slightly adipose, but nontender. Oropharynx is benign  Assessment/Plan Medically appropriate for colonoscopy.  Plan: Proceed to colonoscopy under propofol sedation, using the ultraslim scope which will hopefully allow [place] to navigate her colon all the way to the cecum. If not, consider barium enema bowel prep.  Jillian Andrade 03/02/2014, 8:00 AM

## 2014-03-02 NOTE — Transfer of Care (Signed)
Immediate Anesthesia Transfer of Care Note  Patient: Jillian Andrade  Procedure(s) Performed: Procedure(s) with comments: COLONOSCOPY (N/A) - ultra slim scope  Patient Location: PACU  Anesthesia Type:MAC  Level of Consciousness: awake  Airway & Oxygen Therapy: Patient Spontanous Breathing and Patient connected to face mask oxygen  Post-op Assessment: Report given to PACU RN and Post -op Vital signs reviewed and stable  Post vital signs: Reviewed and stable  Complications: No apparent anesthesia complications

## 2014-03-02 NOTE — Anesthesia Preprocedure Evaluation (Signed)
Anesthesia Evaluation  Patient identified by MRN, date of birth, ID band Patient awake    Reviewed: Allergy & Precautions, H&P , NPO status , Patient's Chart, lab work & pertinent test results  Airway Mallampati: II TM Distance: >3 FB Neck ROM: Full    Dental no notable dental hx.    Pulmonary neg pulmonary ROS,  breath sounds clear to auscultation  Pulmonary exam normal       Cardiovascular negative cardio ROS  Rhythm:Regular Rate:Normal     Neuro/Psych negative neurological ROS  negative psych ROS   GI/Hepatic negative GI ROS, Neg liver ROS,   Endo/Other  negative endocrine ROS  Renal/GU negative Renal ROS  negative genitourinary   Musculoskeletal negative musculoskeletal ROS (+)   Abdominal   Peds negative pediatric ROS (+)  Hematology negative hematology ROS (+)   Anesthesia Other Findings   Reproductive/Obstetrics negative OB ROS                           Anesthesia Physical Anesthesia Plan  ASA: I  Anesthesia Plan: MAC   Post-op Pain Management:    Induction: Intravenous  Airway Management Planned: Nasal Cannula  Additional Equipment:   Intra-op Plan:   Post-operative Plan:   Informed Consent: I have reviewed the patients History and Physical, chart, labs and discussed the procedure including the risks, benefits and alternatives for the proposed anesthesia with the patient or authorized representative who has indicated his/her understanding and acceptance.     Plan Discussed with: CRNA and Surgeon  Anesthesia Plan Comments:         Anesthesia Quick Evaluation

## 2014-03-02 NOTE — Anesthesia Postprocedure Evaluation (Signed)
  Anesthesia Post-op Note  Patient: Jillian Andrade  Procedure(s) Performed: Procedure(s) (LRB): COLONOSCOPY (N/A)  Patient Location: PACU  Anesthesia Type: MAC  Level of Consciousness: awake and alert   Airway and Oxygen Therapy: Patient Spontanous Breathing  Post-op Pain: mild  Post-op Assessment: Post-op Vital signs reviewed, Patient's Cardiovascular Status Stable, Respiratory Function Stable, Patent Airway and No signs of Nausea or vomiting  Last Vitals:  Filed Vitals:   03/02/14 0902  BP: 158/104  Pulse: 50  Temp:   Resp: 15    Post-op Vital Signs: stable   Complications: No apparent anesthesia complications

## 2014-03-02 NOTE — Addendum Note (Signed)
Addendum created 03/02/14 1201 by Sharlette Dense, CRNA   Modules edited: Charges VN

## 2014-03-03 ENCOUNTER — Encounter (HOSPITAL_COMMUNITY): Payer: Self-pay | Admitting: Gastroenterology

## 2014-11-09 ENCOUNTER — Other Ambulatory Visit: Payer: Self-pay

## 2014-11-09 DIAGNOSIS — Z1231 Encounter for screening mammogram for malignant neoplasm of breast: Secondary | ICD-10-CM

## 2014-11-16 ENCOUNTER — Ambulatory Visit: Payer: 59

## 2014-11-29 ENCOUNTER — Ambulatory Visit: Payer: 59

## 2014-12-21 ENCOUNTER — Ambulatory Visit
Admission: RE | Admit: 2014-12-21 | Discharge: 2014-12-21 | Disposition: A | Discharge status: Home / Self Care | Payer: 59 | Source: Ambulatory Visit

## 2014-12-21 DIAGNOSIS — Z1231 Encounter for screening mammogram for malignant neoplasm of breast: Secondary | ICD-10-CM

## 2015-01-03 ENCOUNTER — Other Ambulatory Visit: Payer: Self-pay | Admitting: Obstetrics & Gynecology

## 2015-01-04 LAB — CYTOLOGY - PAP

## 2015-01-14 ENCOUNTER — Encounter (HOSPITAL_COMMUNITY): Payer: Self-pay | Admitting: Emergency Medicine

## 2015-01-14 ENCOUNTER — Emergency Department (HOSPITAL_COMMUNITY)
Admission: EM | Admit: 2015-01-14 | Discharge: 2015-01-14 | Disposition: A | Discharge status: Home / Self Care | Payer: 59 | Source: Home / Self Care | Attending: Family Medicine | Admitting: Family Medicine

## 2015-01-14 DIAGNOSIS — J302 Other seasonal allergic rhinitis: Secondary | ICD-10-CM | POA: Diagnosis not present

## 2015-01-14 DIAGNOSIS — R059 Cough, unspecified: Secondary | ICD-10-CM

## 2015-01-14 DIAGNOSIS — R05 Cough: Secondary | ICD-10-CM

## 2015-01-14 MED ORDER — GUAIFENESIN-CODEINE 100-10 MG/5ML PO SOLN: 5.0000 mL | Freq: Every evening | ORAL | Status: DC | PRN

## 2015-01-14 MED ORDER — IPRATROPIUM BROMIDE 0.06 % NA SOLN: 2.0000 | Freq: Four times a day (QID) | NASAL | Status: DC

## 2015-01-14 MED ORDER — PREDNISONE 10 MG PO TABS: 30.0000 mg | ORAL_TABLET | Freq: Every day | ORAL | Status: DC

## 2015-01-14 MED ORDER — FLUTICASONE PROPIONATE 50 MCG/ACT NA SUSP: 2.0000 | Freq: Every day | NASAL | Status: DC

## 2015-01-14 NOTE — ED Provider Notes (Signed)
Jillian Andrade is a 54 y.o. female who presents to Urgent Care today for cough. Patient has a two-week history of cold cough chest tightness and she what area is runny nose sneezing. No fevers chills nausea vomiting or diarrhea. She has tried Claritin which helped a little. She feels well otherwise. No chest pains or palpitations or abdominal pain.   Past Medical History  Diagnosis Date  . Complication of anesthesia     n/v  . PONV (postoperative nausea and vomiting)   . No pertinent past medical history   . MVP (mitral valve prolapse) mild, no cardiologist  . Fibroids in ovaries     pain in left side   Past Surgical History  Procedure Laterality Date  . Abdominal hysterectomy    . Dilation and curettage of uterus    . Hemorrhoid surgery    . Colonoscopy    . Ankle arthroscopy  10/31/2011    Procedure: ANKLE ARTHROSCOPY;  Surgeon: Colin Rhein, MD;  Location: [facility];  Service: Orthopedics;  Laterality: Right;  RIGHT ANKLE ARTHROSCOPY WITH EXTENSIVE DEBRIDEMENT  . Colonoscopy N/A 03/02/2014    Procedure: COLONOSCOPY;  Surgeon: Cleotis Nipper, MD;  Location: [facility];  Service: Endoscopy;  Laterality: N/A;  ultra slim scope   History  Substance Use Topics  . Smoking status: Never Smoker   . Smokeless tobacco: Never Used  . Alcohol Use: No   ROS as above Medications: No current facility-administered medications for this encounter.   Current Outpatient Prescriptions  Medication Sig Dispense Refill  . fluticasone (FLONASE) 50 MCG/ACT nasal spray Place 2 sprays into both nostrils daily. 16 g 2  . guaiFENesin-codeine 100-10 MG/5ML syrup Take 5 mLs by mouth at bedtime as needed for cough. 120 mL 0  . ibuprofen (ADVIL,MOTRIN) 200 MG tablet Take 800 mg by mouth every 6 (six) hours as needed for mild pain.    Marland Kitchen ipratropium (ATROVENT) 0.06 % nasal spray Place 2 sprays into both nostrils 4 (four) times daily. 15 mL 1  . predniSONE (DELTASONE) 10 MG tablet Take 3  tablets (30 mg total) by mouth daily. 15 tablet 0   Allergies  Allergen Reactions  . Bee Venom Anaphylaxis  . Eggs Or Egg-Derived Products Nausea And Vomiting  . Orange Juice [Orange Oil] Hives and Nausea And Vomiting  . Oxycodone Hives  . Wygesic [Propoxyphene N-Acetaminophen] Nausea And Vomiting     Exam:  BP 138/90 mmHg  Pulse 71  Temp(Src) 98.1 F (36.7 C) (Oral)  Resp 18  SpO2 97% Gen: Well NAD HEENT: EOMI,  MMM inflamed nasal turbinates with clear discharge. Posterior pharynx with cobblestoning. Normal tympanic membranes bilaterally. Lungs: Normal work of breathing. CTABL Heart: RRR no MRG Abd: NABS, Soft. Nondistended, Nontender Exts: Brisk capillary refill, warm and well perfused.   No results found for this or any previous visit (from the past 24 hour(s)). No results found.  Assessment and Plan: 54 y.o. female with allergic rhinitis and pharyngitis with probable allergic sinusitis. Treat with prednisone and Atrovent nasal spray and codeine cough syrup.  Discussed warning signs or symptoms. Please see discharge instructions. Patient expresses understanding.     Gregor Hams, MD 01/14/15 1800

## 2015-01-14 NOTE — ED Notes (Signed)
C/o cold sx onset 2 weeks Sx include cough, chest tightness, nose bleeds, HA Denies fevers, chills Alert, no signs of acute distress.

## 2015-01-14 NOTE — Discharge Instructions (Signed)
Thank you for coming in today. Call or go to the emergency room if you get worse, have trouble breathing, have chest pains, or palpitations.    Allergies Allergies may happen from anything your body is sensitive to. This may be food, medicines, pollens, chemicals, and nearly anything around you in everyday life that produces allergens. An allergen is anything that causes an allergy producing substance. Heredity is often a factor in causing these problems. This means you may have some of the same allergies as your parents. Food allergies happen in all age groups. Food allergies are some of the most severe and life threatening. Some common food allergies are cow's milk, seafood, eggs, nuts, wheat, and soybeans. SYMPTOMS   Swelling around the mouth.  An itchy red rash or hives.  Vomiting or diarrhea.  Difficulty breathing. SEVERE ALLERGIC REACTIONS ARE LIFE-THREATENING. This reaction is called anaphylaxis. It can cause the mouth and throat to swell and cause difficulty with breathing and swallowing. In severe reactions only a trace amount of food (for example, peanut oil in a salad) may cause death within seconds. Seasonal allergies occur in all age groups. These are seasonal because they usually occur during the same season every year. They may be a reaction to molds, grass pollens, or tree pollens. Other causes of problems are house dust mite allergens, pet dander, and mold spores. The symptoms often consist of nasal congestion, a runny itchy nose associated with sneezing, and tearing itchy eyes. There is often an associated itching of the mouth and ears. The problems happen when you come in contact with pollens and other allergens. Allergens are the particles in the air that the body reacts to with an allergic reaction. This causes you to release allergic antibodies. Through a chain of events, these eventually cause you to release histamine into the blood stream. Although it is meant to be  protective to the body, it is this release that causes your discomfort. This is why you were given anti-histamines to feel better. If you are unable to pinpoint the offending allergen, it may be determined by skin or blood testing. Allergies cannot be cured but can be controlled with medicine. Hay fever is a collection of all or some of the seasonal allergy problems. It may often be treated with simple over-the-counter medicine such as diphenhydramine. Take medicine as directed. Do not drink alcohol or drive while taking this medicine. Check with your caregiver or package insert for child dosages. If these medicines are not effective, there are many new medicines your caregiver can prescribe. Stronger medicine such as nasal spray, eye drops, and corticosteroids may be used if the first things you try do not work well. Other treatments such as immunotherapy or desensitizing injections can be used if all else fails. Follow up with your caregiver if problems continue. These seasonal allergies are usually not life threatening. They are generally more of a nuisance that can often be handled using medicine. HOME CARE INSTRUCTIONS   If unsure what causes a reaction, keep a diary of foods eaten and symptoms that follow. Avoid foods that cause reactions.  If hives or rash are present:  Take medicine as directed.  You may use an over-the-counter antihistamine (diphenhydramine) for hives and itching as needed.  Apply cold compresses (cloths) to the skin or take baths in cool water. Avoid hot baths or showers. Heat will make a rash and itching worse.  If you are severely allergic:  Following a treatment for a severe reaction, hospitalization is  often required for closer follow-up.  Wear a medic-alert bracelet or necklace stating the allergy.  You and your family must learn how to give adrenaline or use an anaphylaxis kit.  If you have had a severe reaction, always carry your anaphylaxis kit or EpiPen  with you. Use this medicine as directed by your caregiver if a severe reaction is occurring. Failure to do so could have a fatal outcome. SEEK MEDICAL CARE IF:  You suspect a food allergy. Symptoms generally happen within 30 minutes of eating a food.  Your symptoms have not gone away within 2 days or are getting worse.  You develop new symptoms.  You want to retest yourself or your child with a food or drink you think causes an allergic reaction. Never do this if an anaphylactic reaction to that food or drink has happened before. Only do this under the care of a caregiver. SEEK IMMEDIATE MEDICAL CARE IF:   You have difficulty breathing, are wheezing, or have a tight feeling in your chest or throat.  You have a swollen mouth, or you have hives, swelling, or itching all over your body.  You have had a severe reaction that has responded to your anaphylaxis kit or an EpiPen. These reactions may return when the medicine has worn off. These reactions should be considered life threatening. MAKE SURE YOU:   Understand these instructions.  Will watch your condition.  Will get help right away if you are not doing well or get worse. Document Released: 12/25/2002 Document Revised: 01/26/2013 Document Reviewed: 05/31/2008 [facility] Patient Information 2015 [place], [place]. This information is not intended to replace advice given to you by your health care provider. Make sure you discuss any questions you have with your health care provider.

## 2015-04-09 ENCOUNTER — Emergency Department (HOSPITAL_COMMUNITY): Payer: 59

## 2015-04-09 ENCOUNTER — Encounter (HOSPITAL_COMMUNITY): Payer: Self-pay | Admitting: *Deleted

## 2015-04-09 ENCOUNTER — Emergency Department (HOSPITAL_COMMUNITY)
Admission: EM | Admit: 2015-04-09 | Discharge: 2015-04-09 | Disposition: A | Discharge status: Home / Self Care | Payer: 59 | Attending: Emergency Medicine | Admitting: Emergency Medicine

## 2015-04-09 DIAGNOSIS — Z86018 Personal history of other benign neoplasm: Secondary | ICD-10-CM | POA: Insufficient documentation

## 2015-04-09 DIAGNOSIS — Z8679 Personal history of other diseases of the circulatory system: Secondary | ICD-10-CM | POA: Diagnosis not present

## 2015-04-09 DIAGNOSIS — R0602 Shortness of breath: Secondary | ICD-10-CM | POA: Insufficient documentation

## 2015-04-09 DIAGNOSIS — R079 Chest pain, unspecified: Secondary | ICD-10-CM | POA: Diagnosis not present

## 2015-04-09 LAB — BASIC METABOLIC PANEL
ANION GAP: 9 (ref 5–15)
BUN: 13 mg/dL (ref 6–20)
CHLORIDE: 105 mmol/L (ref 101–111)
CO2: 24 mmol/L (ref 22–32)
Calcium: 9.6 mg/dL (ref 8.9–10.3)
Creatinine, Ser: 1 mg/dL (ref 0.44–1.00)
GFR calc Af Amer: >60 mL/min (ref >60)
GLUCOSE: 102 mg/dL — AB (ref 65–99)
Potassium: 3.8 mmol/L (ref 3.5–5.1)
Sodium: 138 mmol/L (ref 135–145)

## 2015-04-09 LAB — D-DIMER, QUANTITATIVE (NOT AT ARMC)

## 2015-04-09 LAB — CBC
HCT: 38.7 % (ref 36.0–46.0)
Hemoglobin: 13 g/dL (ref 12.0–15.0)
MCH: 29.5 pg (ref 26.0–34.0)
MCHC: 33.6 g/dL (ref 30.0–36.0)
MCV: 88 fL (ref 78.0–100.0)
Platelets: 153 10*3/uL (ref 150–400)
RBC: 4.4 MIL/uL (ref 3.87–5.11)
RDW: 12.9 % (ref 11.5–15.5)
WBC: 9.8 10*3/uL (ref 4.0–10.5)

## 2015-04-09 LAB — BRAIN NATRIURETIC PEPTIDE: B Natriuretic Peptide: 27.4 pg/mL (ref 0.0–100.0)

## 2015-04-09 LAB — I-STAT TROPONIN, ED
TROPONIN I, POC: 0 ng/mL (ref 0.00–0.08)
Troponin i, poc: 0 ng/mL (ref 0.00–0.08)

## 2015-04-09 MED ORDER — GI COCKTAIL ~~LOC~~
30.0000 mL | Freq: Once | ORAL | Status: AC
  Administered 2015-04-09: 30 mL via ORAL
  Filled 2015-04-09: qty 30

## 2015-04-09 MED ORDER — ASPIRIN 325 MG PO TABS
325.0000 mg | ORAL_TABLET | Freq: Once | ORAL | Status: AC
Start: 2015-04-09 — End: 2015-04-09
  Administered 2015-04-09: 325 mg via ORAL
  Filled 2015-04-09: qty 1

## 2015-04-09 NOTE — Discharge Instructions (Signed)
Follow-up with your primary care physician early next week.  Chest Pain (Nonspecific) It is often hard to give a specific diagnosis for the cause of chest pain. There is always a chance that your pain could be related to something serious, such as a heart attack or a blood clot in the lungs. You need to follow up with your health care provider for further evaluation. CAUSES   Heartburn.  Pneumonia or bronchitis.  Anxiety or stress.  Inflammation around your heart (pericarditis) or lung (pleuritis or pleurisy).  A blood clot in the lung.  A collapsed lung (pneumothorax). It can develop suddenly on its own (spontaneous pneumothorax) or from trauma to the chest.  Shingles infection (herpes zoster virus). The chest wall is composed of bones, muscles, and cartilage. Any of these can be the source of the pain.  The bones can be bruised by injury.  The muscles or cartilage can be strained by coughing or overwork.  The cartilage can be affected by inflammation and become sore (costochondritis). DIAGNOSIS  Lab tests or other studies may be needed to find the cause of your pain. Your health care provider may have you take a test called an ambulatory electrocardiogram (ECG). An ECG records your heartbeat patterns over a 24-hour period. You may also have other tests, such as:  Transthoracic echocardiogram (TTE). During echocardiography, sound waves are used to evaluate how blood flows through your heart.  Transesophageal echocardiogram (TEE).  Cardiac monitoring. This allows your health care provider to monitor your heart rate and rhythm in real time.  Holter monitor. This is a portable device that records your heartbeat and can help diagnose heart arrhythmias. It allows your health care provider to track your heart activity for several days, if needed.  Stress tests by exercise or by giving medicine that makes the heart beat faster. TREATMENT   Treatment depends on what may be causing  your chest pain. Treatment may include:  Acid blockers for heartburn.  Anti-inflammatory medicine.  Pain medicine for inflammatory conditions.  Antibiotics if an infection is present.  You may be advised to change lifestyle habits. This includes stopping smoking and avoiding alcohol, caffeine, and chocolate.  You may be advised to keep your head raised (elevated) when sleeping. This reduces the chance of acid going backward from your stomach into your esophagus. Most of the time, nonspecific chest pain will improve within 2-3 days with rest and mild pain medicine.  HOME CARE INSTRUCTIONS   If antibiotics were prescribed, take them as directed. Finish them even if you start to feel better.  For the next few days, avoid physical activities that bring on chest pain. Continue physical activities as directed.  Do not use any tobacco products, including cigarettes, chewing tobacco, or electronic cigarettes.  Avoid drinking alcohol.  Only take medicine as directed by your health care provider.  Follow your health care provider's suggestions for further testing if your chest pain does not go away.  Keep any follow-up appointments you made. If you do not go to an appointment, you could develop lasting (chronic) problems with pain. If there is any problem keeping an appointment, call to reschedule. SEEK MEDICAL CARE IF:   Your chest pain does not go away, even after treatment.  You have a rash with blisters on your chest.  You have a fever. SEEK IMMEDIATE MEDICAL CARE IF:   You have increased chest pain or pain that spreads to your arm, neck, jaw, back, or abdomen.  You have shortness of  breath.  You have an increasing cough, or you cough up blood.  You have severe back or abdominal pain.  You feel nauseous or vomit.  You have severe weakness.  You faint.  You have chills. This is an emergency. Do not wait to see if the pain will go away. Get medical help at once. Call your  local emergency services (911 in [place] Do not drive yourself to the hospital. MAKE SURE YOU:   Understand these instructions.  Will watch your condition.  Will get help right away if you are not doing well or get worse. Document Released: 07/11/2005 Document Revised: 10/06/2013 Document Reviewed: 05/06/2008 [facility] Patient Information 2015 [place], [place]. This information is not intended to replace advice given to you by your health care provider. Make sure you discuss any questions you have with your health care provider.

## 2015-04-09 NOTE — ED Notes (Signed)
Pt reports mid chest pains x 3 days with sob. ekg done at triage, airway intact.

## 2015-04-09 NOTE — ED Provider Notes (Signed)
CSN: [id number]     Arrival date & time 04/09/15  1807 History   First MD Initiated Contact with Patient 04/09/15 1816     Chief Complaint  Patient presents with  . Chest Pain     (Consider location/radiation/quality/duration/timing/severity/associated sxs/prior Treatment) HPI Comments: 54 year old female complaining of chest pain 3 days. Pain has been gradually worsening, initially an intermittent, mild pressure, over the past 2 days, becoming more frequent. States she has a constant, dull pressure in the center of her chest towards her left breast, with occasional episodes of sharp, stabbing pain. States she can either be walking or sitting when the sharp pain occurs and lasts a few minutes, subsiding on its own. Yesterday, she felt a little clammy. Today, she was feeling short of breath when she was walking and when she has the sharp pain. Sharp pain occasionally worsens with deep inspiration. History of chest pain back in the 90s, when she had pneumonia, otherwise has not had any issues. Nonsmoker. Drove back from [place] 5 days ago. No history of blood clots. Denies family history of early heart disease. Denies extremity numbness or weakness, headache, dizziness, fever, chills, cough, nausea or vomiting.  Patient is a 54 y.o. female presenting with chest pain. The history is provided by the patient.  Chest Pain Associated symptoms: shortness of breath     Past Medical History  Diagnosis Date  . Complication of anesthesia     n/v  . PONV (postoperative nausea and vomiting)   . No pertinent past medical history   . MVP (mitral valve prolapse) mild, no cardiologist  . Fibroids in ovaries     pain in left side   Past Surgical History  Procedure Laterality Date  . Abdominal hysterectomy    . Dilation and curettage of uterus    . Hemorrhoid surgery    . Colonoscopy    . Ankle arthroscopy  10/31/2011    Procedure: ANKLE ARTHROSCOPY;  Surgeon: Colin Rhein, MD;  Location: [facility];  Service: Orthopedics;  Laterality: Right;  RIGHT ANKLE ARTHROSCOPY WITH EXTENSIVE DEBRIDEMENT  . Colonoscopy N/A 03/02/2014    Procedure: COLONOSCOPY;  Surgeon: Cleotis Nipper, MD;  Location: [facility];  Service: Endoscopy;  Laterality: N/A;  ultra slim scope   History reviewed. No pertinent family history. History  Substance Use Topics  . Smoking status: Never Smoker   . Smokeless tobacco: Never Used  . Alcohol Use: No   OB History    No data available     Review of Systems  Respiratory: Positive for shortness of breath.   Cardiovascular: Positive for chest pain.  All other systems reviewed and are negative.     Allergies  Bee venom; Eggs or egg-derived products; Orange juice; Oxycodone; and Wygesic  Home Medications   Prior to Admission medications   Medication Sig Start Date End Date Taking? Authorizing Provider  ibuprofen (ADVIL,MOTRIN) 200 MG tablet Take 800 mg by mouth every 6 (six) hours as needed for mild pain.   Yes Historical Provider, MD  Multiple Vitamins-Minerals (ALIVE WOMENS ENERGY) TABS Take 1 tablet by mouth daily.   Yes Historical Provider, MD  fluticasone (FLONASE) 50 MCG/ACT nasal spray Place 2 sprays into both nostrils daily. Patient not taking: Reported on 04/09/2015 01/14/15   Gregor Hams, MD  guaiFENesin-codeine 100-10 MG/5ML syrup Take 5 mLs by mouth at bedtime as needed for cough. Patient not taking: Reported on 04/09/2015 01/14/15   Gregor Hams, MD  ipratropium (ATROVENT)  0.06 % nasal spray Place 2 sprays into both nostrils 4 (four) times daily. Patient not taking: Reported on 04/09/2015 01/14/15   Gregor Hams, MD  predniSONE (DELTASONE) 10 MG tablet Take 3 tablets (30 mg total) by mouth daily. Patient not taking: Reported on 04/09/2015 01/14/15   Gregor Hams, MD   BP 113/65 mmHg  Pulse 55  Temp(Src) 98.8 F (37.1 C) (Oral)  Resp 15  Ht 5\' 4"  (1.626 m)  Wt 180 lb (81.647 kg)  BMI 30.88 kg/m2  SpO2 98% Physical Exam   Constitutional: She is oriented to person, place, and time. She appears well-developed and well-nourished. No distress.  HENT:  Head: Normocephalic and atraumatic.  Mouth/Throat: Oropharynx is clear and moist.  Eyes: Conjunctivae and EOM are normal. Pupils are equal, round, and reactive to light.  Neck: Normal range of motion. Neck supple. No JVD present.  Cardiovascular: Normal rate, regular rhythm, normal heart sounds and intact distal pulses.   No extremity edema.  Pulmonary/Chest: Effort normal and breath sounds normal. No respiratory distress. She exhibits no tenderness.  Chest pain worsened with deep inspiration.  Abdominal: Soft. Bowel sounds are normal. There is no tenderness.  Musculoskeletal: Normal range of motion. She exhibits no edema.  Neurological: She is alert and oriented to person, place, and time. She has normal strength. No sensory deficit.  Speech fluent, goal oriented. Moves extremities without ataxia. Equal grip strength bilateral.  Skin: Skin is warm and dry. She is not diaphoretic.  Psychiatric: She has a normal mood and affect. Her behavior is normal.  Nursing note and vitals reviewed.   ED Course  Procedures (including critical care time) Labs Review Labs Reviewed  BASIC METABOLIC PANEL - Abnormal; Notable for the following:    Glucose, Bld 102 (*)    All other components within normal limits  CBC  BRAIN NATRIURETIC PEPTIDE  D-DIMER, QUANTITATIVE (NOT AT [facility])  I-STAT TROPOININ, ED  Randolm Idol, ED    Imaging Review Dg Chest 2 View  04/09/2015   CLINICAL DATA:  Central left-sided chest pain for 3 days with history of mitral valve prolapse  EXAM: CHEST  2 VIEW  COMPARISON:  None.  FINDINGS: The heart size and mediastinal contours are within normal limits. Both lungs are clear. The visualized skeletal structures are unremarkable.  IMPRESSION: No active cardiopulmonary disease.   Electronically Signed   By: Skipper Cliche M.D.   On: 04/09/2015  18:47     EKG Interpretation None      MDM   Final diagnoses:  Chest pain, unspecified chest pain type   Nontoxic appearing, NAD. AF VSS. Chest pain improved after GI cocktail and aspirin. Doubt cardiac. Low risk. Heart score 2. Workup negative. Doubt PE. D-dimer within normal limits. Delta troponin within normal limits. Patient stable for discharge. Advised follow-up with PCP within 2 days. Return precautions given. Patient states understanding of treatment care plan and is agreeable.  Discussed with attending Dr. [name] who agrees with plan of care.   Carman Ching, PA-C 04/09/15 2223  Pamella Pert, MD 04/10/15 [phone]

## 2015-04-11 ENCOUNTER — Telehealth: Payer: Self-pay | Admitting: Internal Medicine

## 2015-04-11 NOTE — Telephone Encounter (Signed)
Ok with me 

## 2015-04-11 NOTE — Telephone Encounter (Signed)
Patient states spouse is a patient of Dr. Gwynn Burly.  She is requesting Dr. Jenny Reichmann to take her on as a patient.  Please advise.

## 2015-04-12 ENCOUNTER — Ambulatory Visit (INDEPENDENT_AMBULATORY_CARE_PROVIDER_SITE_OTHER): Payer: 59 | Admitting: Internal Medicine

## 2015-04-12 ENCOUNTER — Encounter: Payer: Self-pay | Admitting: Internal Medicine

## 2015-04-12 VITALS — BP 122/82 | HR 69 | Temp 99.1°F | Ht 65.0 in | Wt 187.0 lb

## 2015-04-12 DIAGNOSIS — Z0001 Encounter for general adult medical examination with abnormal findings: Secondary | ICD-10-CM | POA: Insufficient documentation

## 2015-04-12 DIAGNOSIS — Z23 Encounter for immunization: Secondary | ICD-10-CM | POA: Diagnosis not present

## 2015-04-12 DIAGNOSIS — Z0000 Encounter for general adult medical examination without abnormal findings: Secondary | ICD-10-CM | POA: Insufficient documentation

## 2015-04-12 DIAGNOSIS — R042 Hemoptysis: Secondary | ICD-10-CM | POA: Insufficient documentation

## 2015-04-12 DIAGNOSIS — R079 Chest pain, unspecified: Secondary | ICD-10-CM | POA: Diagnosis not present

## 2015-04-12 DIAGNOSIS — D219 Benign neoplasm of connective and other soft tissue, unspecified: Secondary | ICD-10-CM | POA: Insufficient documentation

## 2015-04-12 NOTE — Telephone Encounter (Signed)
Got patient scheduled

## 2015-04-12 NOTE — Progress Notes (Signed)
Subjective:    Patient ID: Jillian Andrade, female    DOB: 1961/03/14, 54 y.o.   MRN: [id number]  HPI  Here for wellness and f/u;  Overall doing ok;  Pt denies Chest pain currently or worsening SOB, DOE, wheezing, orthopnea, PND, worsening LE edema, palpitations, dizziness or syncope, but has had some intermittent anterior CP's sharp and dull, mild but more severe and freq, not assoc with other symptoms.  Seen in ED June 25 with normal d-dimer, trop neg, ecg without acute , still having some CP since then similar, but also an episode of cough with small volume hemoptysis/BRB.  Some cough since then including the am, but no blood. No fever, No wheezing, except with walking her dog in evenings.  CXR in ED neg for acute..   Pt denies neurological change such as new headache, facial or extremity weakness.  Pt denies polydipsia, polyuria, or low sugar symptoms. Pt states overall good compliance with treatment and medications, good tolerability, and has been trying to follow appropriate diet.  Pt denies worsening depressive symptoms, suicidal ideation or panic. No fever, night sweats, wt loss, loss of appetite, or other constitutional symptoms.  Pt states good ability with ADL's, has low fall risk, home safety reviewed and adequate, no other significant changes in hearing or vision, and only occasionally active with exercise. Past Medical History  Diagnosis Date  . Complication of anesthesia     n/v  . PONV (postoperative nausea and vomiting)   . No pertinent past medical history   . MVP (mitral valve prolapse) mild, no cardiologist  . Fibroids in ovaries     pain in left side   Past Surgical History  Procedure Laterality Date  . Abdominal hysterectomy    . Dilation and curettage of uterus    . Hemorrhoid surgery    . Colonoscopy    . Ankle arthroscopy  10/31/2011    Procedure: ANKLE ARTHROSCOPY;  Surgeon: Colin Rhein, MD;  Location: [place];  Service: Orthopedics;  Laterality:  Right;  RIGHT ANKLE ARTHROSCOPY WITH EXTENSIVE DEBRIDEMENT  . Colonoscopy N/A 03/02/2014    Procedure: COLONOSCOPY;  Surgeon: Cleotis Nipper, MD;  Location: [facility];  Service: Endoscopy;  Laterality: N/A;  ultra slim scope    reports that she has never smoked. She has never used smokeless tobacco. She reports that she does not drink alcohol or use illicit drugs. family history includes Glaucoma in her mother; Hypertension in her father; Stroke in her father. Allergies  Allergen Reactions  . Bee Venom Anaphylaxis  . Eggs Or Egg-Derived Products Nausea And Vomiting  . Orange Juice [Orange Oil] Hives and Nausea And Vomiting  . Oxycodone Hives  . Wygesic [Propoxyphene N-Acetaminophen] Nausea And Vomiting   Current Outpatient Prescriptions on File Prior to Visit  Medication Sig Dispense Refill  . fluticasone (FLONASE) 50 MCG/ACT nasal spray Place 2 sprays into both nostrils daily. 16 g 2  . Multiple Vitamins-Minerals (ALIVE WOMENS ENERGY) TABS Take 1 tablet by mouth daily.    Marland Kitchen guaiFENesin-codeine 100-10 MG/5ML syrup Take 5 mLs by mouth at bedtime as needed for cough. (Patient not taking: Reported on 04/09/2015) 120 mL 0  . ibuprofen (ADVIL,MOTRIN) 200 MG tablet Take 800 mg by mouth every 6 (six) hours as needed for mild pain.    Marland Kitchen ipratropium (ATROVENT) 0.06 % nasal spray Place 2 sprays into both nostrils 4 (four) times daily. (Patient not taking: Reported on 04/09/2015) 15 mL 1  . predniSONE (DELTASONE)  10 MG tablet Take 3 tablets (30 mg total) by mouth daily. (Patient not taking: Reported on 04/09/2015) 15 tablet 0   No current facility-administered medications on file prior to visit.    Review of Systems Constitutional: Negative for increased diaphoresis, other activity, appetite or siginficant weight change other than noted HENT: Negative for worsening hearing loss, ear pain, facial swelling, mouth sores and neck stiffness.   Eyes: Negative for other worsening pain, redness or visual  disturbance.  Respiratory: Negative for shortness of breath and wheezing  Cardiovascular: Negative for chest pain and palpitations.  Gastrointestinal: Negative for diarrhea, blood in stool, abdominal distention or other pain Genitourinary: Negative for hematuria, flank pain or change in urine volume.  Musculoskeletal: Negative for myalgias or other joint complaints.  Skin: Negative for color change and wound or drainage.  Neurological: Negative for syncope and numbness. other than noted Hematological: Negative for adenopathy. or other swelling Psychiatric/Behavioral: Negative for hallucinations, SI, self-injury, decreased concentration or other worsening agitation.      Objective:   Physical Exam        Assessment & Plan:

## 2015-04-12 NOTE — Assessment & Plan Note (Addendum)
Afeb, unclear etiology, cxr neg for acute jun25, never smoked, works as Network engineer at [employer] but TB skin testing neg nov 2015 per pt; today for CT chest and refer pulmonary - ? Need bronch

## 2015-04-12 NOTE — Assessment & Plan Note (Signed)
Overall doing well, age appropriate education and counseling updated, referrals for preventative services and immunizations addressed, dietary and smoking counseling addressed, most recent labs reviewed.  I have personally reviewed and have noted:  1) the patient's medical and social history 2) The pt's use of alcohol, tobacco, and illicit drugs 3) The patient's current medications and supplements 4) Functional ability including ADL's, fall risk, home safety risk, hearing and visual impairment 5) Diet and physical activities 6) Evidence for depression or mood disorder 7) The patient's height, weight, and BMI have been recorded in the chart  I have made referrals, and provided counseling and education based on review of the above Reveiwed recent labs from ER, pt requests further labs for now Lab Results  Component Value Date   WBC 9.8 04/09/2015   HGB 13.0 04/09/2015   HCT 38.7 04/09/2015   PLT 153 04/09/2015   GLUCOSE 102* 04/09/2015   NA 138 04/09/2015   K 3.8 04/09/2015   CL 105 04/09/2015   CREATININE 1.00 04/09/2015   BUN 13 04/09/2015   CO2 24 04/09/2015

## 2015-04-12 NOTE — Addendum Note (Signed)
Addended by: Lyman Bishop on: 04/12/2015 04:37 PM   Modules accepted: Orders

## 2015-04-12 NOTE — Patient Instructions (Addendum)
You had the tetanus shot today (Tdap)  Please continue all other medications as before, and refills have been done if requested.  Please have the pharmacy call with any other refills you may need.  Please continue your efforts at being more active, low cholesterol diet, and weight control.  You are otherwise up to date with prevention measures today.  Please keep your appointments with your specialists as you may have planned  You will be contacted regarding the referral for: CT chest, and pulmonary referral, as well as stress testing  Please return in 6 months, or sooner if needed

## 2015-04-12 NOTE — Assessment & Plan Note (Signed)
Etiology unclear, possible related to process leading to the hemoptysis? But cant r/o cardiac given age though has little in the way of other risk factors, for stress testing

## 2015-04-14 ENCOUNTER — Telehealth: Payer: Self-pay | Admitting: Internal Medicine

## 2015-04-14 ENCOUNTER — Encounter: Payer: Self-pay | Admitting: Internal Medicine

## 2015-04-14 DIAGNOSIS — R042 Hemoptysis: Secondary | ICD-10-CM

## 2015-04-14 NOTE — Telephone Encounter (Signed)
-----   Message from Osvaldo Shipper, [place] sent at 04/14/2015  3:08 PM EDT ----- Dr. Jenny Reichmann would you like [place] to order this CT W contrast?   Thanks Erline Levine

## 2015-04-20 ENCOUNTER — Ambulatory Visit (HOSPITAL_COMMUNITY)
Admission: RE | Admit: 2015-04-20 | Discharge: 2015-04-20 | Disposition: A | Discharge status: Home / Self Care | Payer: 59 | Source: Ambulatory Visit | Attending: Internal Medicine | Admitting: Internal Medicine

## 2015-04-20 DIAGNOSIS — R079 Chest pain, unspecified: Secondary | ICD-10-CM | POA: Insufficient documentation

## 2015-04-20 DIAGNOSIS — R042 Hemoptysis: Secondary | ICD-10-CM | POA: Insufficient documentation

## 2015-04-20 MED ORDER — IOHEXOL 300 MG/ML  SOLN
80.0000 mL | Freq: Once | INTRAMUSCULAR | Status: AC | PRN
  Administered 2015-04-20: 100 mL via INTRAVENOUS

## 2015-04-22 ENCOUNTER — Other Ambulatory Visit: Payer: 59

## 2015-04-25 ENCOUNTER — Telehealth (HOSPITAL_COMMUNITY): Payer: Self-pay | Admitting: *Deleted

## 2015-04-25 NOTE — Telephone Encounter (Signed)
Left message on voicemail per DPR in reference to upcoming appointment scheduled on 04/27/15 at 1145 with detailed instructions given per Myocardial Perfusion Study Information Sheet for the test. LM to arrive 15 minutes early, and that it is imperative to arrive on time for appointment to keep from having the test rescheduled.Phone number given for call back for any questions. Pietrina Jagodzinski, [place]

## 2015-04-27 ENCOUNTER — Ambulatory Visit (HOSPITAL_COMMUNITY): Payer: 59 | Attending: Cardiology

## 2015-04-27 DIAGNOSIS — R079 Chest pain, unspecified: Secondary | ICD-10-CM | POA: Insufficient documentation

## 2015-04-27 LAB — MYOCARDIAL PERFUSION IMAGING
CHL CUP NUCLEAR SRS: 1
CHL CUP RESTING HR STRESS: 56 {beats}/min
CSEPED: 7 min
CSEPEDS: 31 s
CSEPEW: 9.3 METS
LV dias vol: 74 mL
LVSYSVOL: 25 mL
MPHR: 169 {beats}/min
NUC STRESS TID: 0.97
Peak HR: 153 {beats}/min
Percent HR: 91 %
RATE: 0.41
RPE: 18
SDS: 2
SSS: 3

## 2015-04-27 MED ORDER — TECHNETIUM TC 99M SESTAMIBI GENERIC - CARDIOLITE
10.1000 | Freq: Once | INTRAVENOUS | Status: AC | PRN
  Administered 2015-04-27: 10.1 via INTRAVENOUS

## 2015-04-27 MED ORDER — TECHNETIUM TC 99M SESTAMIBI GENERIC - CARDIOLITE
31.7000 | Freq: Once | INTRAVENOUS | Status: AC | PRN
  Administered 2015-04-27: 31.7 via INTRAVENOUS

## 2015-05-25 ENCOUNTER — Institutional Professional Consult (permissible substitution): Payer: 59 | Admitting: Emergency Medicine

## 2015-06-21 ENCOUNTER — Ambulatory Visit (INDEPENDENT_AMBULATORY_CARE_PROVIDER_SITE_OTHER): Payer: 59 | Admitting: Emergency Medicine

## 2015-06-21 ENCOUNTER — Encounter: Payer: Self-pay | Admitting: Emergency Medicine

## 2015-06-21 VITALS — BP 118/78 | HR 63 | Ht 64.0 in | Wt 184.4 lb

## 2015-06-21 DIAGNOSIS — R079 Chest pain, unspecified: Secondary | ICD-10-CM

## 2015-06-21 DIAGNOSIS — R042 Hemoptysis: Secondary | ICD-10-CM

## 2015-06-21 DIAGNOSIS — R05 Cough: Secondary | ICD-10-CM

## 2015-06-21 DIAGNOSIS — R059 Cough, unspecified: Secondary | ICD-10-CM | POA: Insufficient documentation

## 2015-06-21 MED ORDER — PANTOPRAZOLE SODIUM 40 MG PO TBEC: 40.0000 mg | DELAYED_RELEASE_TABLET | Freq: Every day | ORAL | Status: DC

## 2015-06-21 NOTE — Patient Instructions (Addendum)
Please start pantoprazole 40mg  daily until next visit. Take ths medication is her one hour before or 1 hour after eating We will perform full pulmonary function testing Try to avoid throat clearing Raise the head of your bed slightly to help with nighttime reflux symptoms.  Do not eat after 8:00 PM Follow with Dr Lamonte Sakai next available with full  PFT

## 2015-06-21 NOTE — Assessment & Plan Note (Signed)
Suspected this is related to GERD. She has had a CT scan, an ECG, troponin that are all reassuring. If the pain continues then I will refer her for further evaluation by cardiology, Dr. Tamala Julian

## 2015-06-21 NOTE — Assessment & Plan Note (Signed)
Small amount of hemoptysis seen in July, none since. Suspect that this is benign and relates to her frequent and exacerbated cough. I will concentrate on treating the cough but if hemoptysis recurs that she needs bronchoscopy.

## 2015-06-21 NOTE — Progress Notes (Signed)
Subjective:    Patient ID: Jillian Andrade, female    DOB: 01-03-1961, 54 y.o.   MRN: [id number]  HPI 54 year old woman, never smoker, with a history of mitral valve prolapse and uterine fibroids. She is referred for evaluation by Dr. Jenny Reichmann for cough and chest discomfort. I have reviewed all of her chart records including ED notes and Dr. Gwynn Burly notes. She had periods of anterior dull chest pain that prompted evaluation in the emergency department June 25. She had a reassuring ECG, troponin, d-dimer, CXR. Since that evaluation she has continued to have intermittent chest discomfort associated with cough. On at least one occasion there was a small amount of hemoptysis. She has seen none since. The cough appears to be worse in the mornings, productive of some clear to yellow.  Can happen at night and wake her from sleep, appears to be worst at night.  A CT scan of the chest was performed on 04/20/15 that I have personally reviewed. It shows normal airways, normal parenchyma, no abnormalities.  She was treated with Pred taper in June.   Now she is still having some dull pain in her chest, can happen at any time including exertion. Still coughs, especially at night, wakes her. Can sometimes have a reflux sour taste. No wheezing. Occasional nasal congestion, rhinitis. Temporarily on fluticasone, atrovent nasal sprays.       Review of Systems  Constitutional: Negative for fever, chills and unexpected weight change.  HENT: Negative for congestion, dental problem, ear pain, nosebleeds, postnasal drip, rhinorrhea, sinus pressure, sneezing, sore throat, trouble swallowing and voice change.   Eyes: Negative for visual disturbance.  Respiratory: Positive for cough and shortness of breath. Negative for choking.   Cardiovascular: Positive for chest pain. Negative for leg swelling.  Gastrointestinal: Positive for diarrhea. Negative for vomiting and abdominal pain.       Reflux sx  Genitourinary: Negative for  difficulty urinating.  Musculoskeletal: Negative for arthralgias.  Skin: Negative for rash.  Neurological: Negative for tremors, syncope and headaches.  Hematological: Does not bruise/bleed easily.    Past Medical History  Diagnosis Date  . Complication of anesthesia     n/v  . PONV (postoperative nausea and vomiting)   . No pertinent past medical history   . MVP (mitral valve prolapse) mild, no cardiologist  . Fibroids in ovaries     pain in left side     Family History  Problem Relation Age of Onset  . Stroke Father   . Hypertension Father   . Glaucoma Mother      Social History   Social History  . Marital Status: Married    Spouse Name: N/A  . Number of Children: N/A  . Years of Education: N/A   Occupational History  . Not on file.   Social History Main Topics  . Smoking status: Passive Smoke Exposure - Never Smoker  . Smokeless tobacco: Never Used  . Alcohol Use: No  . Drug Use: No  . Sexual Activity: Not on file   Other Topics Concern  . Not on file   Social History Narrative     Allergies  Allergen Reactions  . Bee Venom Anaphylaxis  . Eggs Or Egg-Derived Products Nausea And Vomiting  . Orange Juice [Orange Oil] Hives and Nausea And Vomiting  . Oxycodone Hives  . Wygesic [Propoxyphene N-Acetaminophen] Nausea And Vomiting     Outpatient Prescriptions Prior to Visit  Medication Sig Dispense Refill  . Multiple Vitamins-Minerals (ALIVE WOMENS ENERGY)  TABS Take 1 tablet by mouth daily.    . fluticasone (FLONASE) 50 MCG/ACT nasal spray Place 2 sprays into both nostrils daily. (Patient not taking: Reported on 06/21/2015) 16 g 2  . guaiFENesin-codeine 100-10 MG/5ML syrup Take 5 mLs by mouth at bedtime as needed for cough. (Patient not taking: Reported on 04/09/2015) 120 mL 0  . ibuprofen (ADVIL,MOTRIN) 200 MG tablet Take 800 mg by mouth every 6 (six) hours as needed for mild pain.    Marland Kitchen ipratropium (ATROVENT) 0.06 % nasal spray Place 2 sprays into both  nostrils 4 (four) times daily. (Patient not taking: Reported on 04/09/2015) 15 mL 1  . predniSONE (DELTASONE) 10 MG tablet Take 3 tablets (30 mg total) by mouth daily. (Patient not taking: Reported on 04/09/2015) 15 tablet 0   No facility-administered medications prior to visit.        Objective:   Physical Exam Filed Vitals:   06/21/15 1417  BP: 118/78  Pulse: 63  Height: 5\' 4"  (1.626 m)  Weight: 184 lb 6.4 oz (83.643 kg)  SpO2: 97%   Gen: Pleasant, well-nourished, in no distress,  normal affect  ENT: No lesions,  mouth clear,  oropharynx clear, no postnasal drip  Neck: No JVD, no TMG, no carotid bruits  Lungs: No use of accessory muscles, no dullness to percussion, clear without rales or rhonchi  Cardiovascular: RRR, heart sounds normal, no murmur or gallops, no peripheral edema  Musculoskeletal: No deformities, no cyanosis or clubbing  Neuro: alert, non focal  Skin: Warm, no lesions or rashes    04/20/15 --  EXAM: CT CHEST WITH CONTRAST  TECHNIQUE: Multidetector CT imaging of the chest was performed during intravenous contrast administration.  CONTRAST: 180mL OMNIPAQUE IOHEXOL 300 MG/ML SOLN  COMPARISON: No priors.  FINDINGS: Mediastinum/Lymph Nodes: Heart size is normal. There is no significant pericardial fluid, thickening or pericardial calcification. Bovine type thoracic aortic arch (normal anatomical variant) incidentally noted. No pathologically enlarged mediastinal or hilar lymph nodes. Esophagus is unremarkable in appearance. No axillary lymphadenopathy.  Lungs/Pleura: No acute consolidative airspace disease. No pleural effusions. No suspicious appearing pulmonary nodules or masses. Trachea and main airways appear widely patent.  Upper Abdomen: Unremarkable.  Musculoskeletal/Soft Tissues: There are no acute displaced fractures or aggressive appearing lytic or blastic lesions noted in the visualized portions of the  skeleton.  IMPRESSION: 1. No acute cardiopulmonary disease identified.     Assessment & Plan:  Cough Suspect that the etiology is related to GERD based on the description of her symptoms and the timing of her cough. I would like to try to treat her empirically with PPI. We may need to add empiric allergy regimen at some point as well. I will perform full PFT to rule out obstructive lung disease. Depending on her response to PPI and her results we may decide to pursue bronchoscopy and / or a referral to cardiology to better evaluate her chest discomfort  Hemoptysis Small amount of hemoptysis seen in July, none since. Suspect that this is benign and relates to her frequent and exacerbated cough. I will concentrate on treating the cough but if hemoptysis recurs that she needs bronchoscopy.   Chest pain Suspected this is related to GERD. She has had a CT scan, an ECG, troponin that are all reassuring. If the pain continues then I will refer her for further evaluation by cardiology, Dr. Tamala Julian

## 2015-06-21 NOTE — Assessment & Plan Note (Signed)
Suspect that the etiology is related to GERD based on the description of her symptoms and the timing of her cough. I would like to try to treat her empirically with PPI. We may need to add empiric allergy regimen at some point as well. I will perform full PFT to rule out obstructive lung disease. Depending on her response to PPI and her results we may decide to pursue bronchoscopy and / or a referral to cardiology to better evaluate her chest discomfort

## 2015-08-02 ENCOUNTER — Encounter: Payer: Self-pay | Admitting: Emergency Medicine

## 2015-08-02 ENCOUNTER — Ambulatory Visit (INDEPENDENT_AMBULATORY_CARE_PROVIDER_SITE_OTHER): Payer: 59 | Admitting: Emergency Medicine

## 2015-08-02 VITALS — BP 106/70 | HR 57 | Ht 64.0 in | Wt 182.0 lb

## 2015-08-02 DIAGNOSIS — R059 Cough, unspecified: Secondary | ICD-10-CM

## 2015-08-02 DIAGNOSIS — R05 Cough: Secondary | ICD-10-CM

## 2015-08-02 LAB — PULMONARY FUNCTION TEST
DL/VA % pred: 114 %
DL/VA: 5.48 ml/min/mmHg/L
DLCO unc % pred: 84 %
DLCO unc: 20.55 ml/min/mmHg
FEF 25-75 Post: 4 L/sec
FEF 25-75 Pre: 2.75 L/sec
FEF2575-%CHANGE-POST: 45 %
FEF2575-%PRED-POST: 168 %
FEF2575-%PRED-PRE: 115 %
FEV1-%Change-Post: 9 %
FEV1-%PRED-POST: 109 %
FEV1-%PRED-PRE: 100 %
FEV1-PRE: 2.28 L
FEV1-Post: 2.5 L
FEV1FVC-%CHANGE-POST: 6 %
FEV1FVC-%Pred-Pre: 106 %
FEV6-%Change-Post: 2 %
FEV6-%PRED-POST: 98 %
FEV6-%Pred-Pre: 95 %
FEV6-PRE: 2.66 L
FEV6-Post: 2.73 L
FEV6FVC-%PRED-PRE: 103 %
FEV6FVC-%Pred-Post: 103 %
FVC-%Change-Post: 2 %
FVC-%Pred-Post: 95 %
FVC-%Pred-Pre: 93 %
FVC-Post: 2.73 L
FVC-Pre: 2.66 L
POST FEV1/FVC RATIO: 91 %
Post FEV6/FVC ratio: 100 %
Pre FEV1/FVC ratio: 86 %
Pre FEV6/FVC Ratio: 100 %
RV % PRED: 74 %
RV: 1.36 L
TLC % PRED: 81 %
TLC: 4.11 L

## 2015-08-02 MED ORDER — PANTOPRAZOLE SODIUM 40 MG PO TBEC: 40.0000 mg | DELAYED_RELEASE_TABLET | Freq: Every day | ORAL | Status: DC

## 2015-08-02 MED ORDER — LORATADINE 10 MG PO TABS: 10.0000 mg | ORAL_TABLET | Freq: Every day | ORAL | Status: DC

## 2015-08-02 NOTE — Patient Instructions (Addendum)
Please restart Protonix 40mg  daily.  Please start loratadine 10mg  daily during the Fall and Spring months.  Your PFT's look normal - no evidence for asthma.  Follow with Dr Lamonte Sakai if your cough or breathing change in any way.

## 2015-08-02 NOTE — Progress Notes (Signed)
Subjective:    Patient ID: Jillian Andrade, female    DOB: 1961/05/07, 54 y.o.   MRN: [id number]  Cough Pertinent negatives include no chest pain, chills, ear pain, fever, headaches, postnasal drip, rash, rhinorrhea, sore throat or shortness of breath.   54 year old woman, never smoker, with a history of mitral valve prolapse and uterine fibroids. She is referred for evaluation by Dr. Jenny Reichmann for cough and chest discomfort. I have reviewed all of her chart records including ED notes and Dr. Gwynn Burly notes. She had periods of anterior dull chest pain that prompted evaluation in the emergency department June 25. She had a reassuring ECG, troponin, d-dimer, CXR. Since that evaluation she has continued to have intermittent chest discomfort associated with cough. On at least one occasion there was a small amount of hemoptysis. She has seen none since. The cough appears to be worse in the mornings, productive of some clear to yellow.  Can happen at night and wake her from sleep, appears to be worst at night.  A CT scan of the chest was performed on 04/20/15 that I have personally reviewed. It shows normal airways, normal parenchyma, no abnormalities.  She was treated with Pred taper in June.   Now she is still having some dull pain in her chest, can happen at any time including exertion. Still coughs, especially at night, wakes her. Can sometimes have a reflux sour taste. No wheezing. Occasional nasal congestion, rhinitis. Temporarily on fluticasone, atrovent nasal sprays.   ROV 08/02/15 -- follow-up visit for cough and chest discomfort. Based on description we decided to treat her empirically for GERD at her visit a month ago. She underwent pulmonary function testing today that I personally reviewed these show normal airflows without a significant bronchodilator response normal TLC with a decreased RV consistent with mild restriction and a normal diffusion capacity.  She states that her cough did get better on the  protonix - she stopped it last week. Her cough has increased some since finishing it.      Review of Systems  Constitutional: Negative for fever, chills and unexpected weight change.  HENT: Negative for congestion, dental problem, ear pain, nosebleeds, postnasal drip, rhinorrhea, sinus pressure, sneezing, sore throat, trouble swallowing and voice change.   Eyes: Negative for visual disturbance.  Respiratory: Positive for cough. Negative for choking and shortness of breath.   Cardiovascular: Negative for chest pain and leg swelling.  Gastrointestinal: Negative for vomiting, abdominal pain and diarrhea.  Genitourinary: Negative for difficulty urinating.  Musculoskeletal: Negative for arthralgias.  Skin: Negative for rash.  Neurological: Negative for tremors, syncope and headaches.  Hematological: Does not bruise/bleed easily.    Past Medical History  Diagnosis Date  . Complication of anesthesia     n/v  . PONV (postoperative nausea and vomiting)   . No pertinent past medical history   . MVP (mitral valve prolapse) mild, no cardiologist  . Fibroids in ovaries     pain in left side     Family History  Problem Relation Age of Onset  . Stroke Father   . Hypertension Father   . Glaucoma Mother      Social History   Social History  . Marital Status: Married    Spouse Name: N/A  . Number of Children: N/A  . Years of Education: N/A   Occupational History  . Not on file.   Social History Main Topics  . Smoking status: Passive Smoke Exposure - Never Smoker  . Smokeless tobacco:  Never Used  . Alcohol Use: No  . Drug Use: No  . Sexual Activity: Not on file   Other Topics Concern  . Not on file   Social History Narrative     Allergies  Allergen Reactions  . Bee Venom Anaphylaxis  . Eggs Or Egg-Derived Products Nausea And Vomiting  . Orange Juice [Orange Oil] Hives and Nausea And Vomiting  . Oxycodone Hives  . Wygesic [Propoxyphene N-Acetaminophen] Nausea And Vomiting      Outpatient Prescriptions Prior to Visit  Medication Sig Dispense Refill  . Multiple Vitamins-Minerals (ALIVE WOMENS ENERGY) TABS Take 1 tablet by mouth daily.    . fluticasone (FLONASE) 50 MCG/ACT nasal spray Place 2 sprays into both nostrils daily. (Patient not taking: Reported on 06/21/2015) 16 g 2  . guaiFENesin-codeine 100-10 MG/5ML syrup Take 5 mLs by mouth at bedtime as needed for cough. (Patient not taking: Reported on 04/09/2015) 120 mL 0  . ibuprofen (ADVIL,MOTRIN) 200 MG tablet Take 800 mg by mouth every 6 (six) hours as needed for mild pain.    Marland Kitchen ipratropium (ATROVENT) 0.06 % nasal spray Place 2 sprays into both nostrils 4 (four) times daily. (Patient not taking: Reported on 04/09/2015) 15 mL 1  . pantoprazole (PROTONIX) 40 MG tablet Take 1 tablet (40 mg total) by mouth daily. 30 tablet 5  . predniSONE (DELTASONE) 10 MG tablet Take 3 tablets (30 mg total) by mouth daily. (Patient not taking: Reported on 04/09/2015) 15 tablet 0   No facility-administered medications prior to visit.        Objective:   Physical Exam Filed Vitals:   08/02/15 1354 08/02/15 1357  BP:  106/70  Pulse:  57  Height: 5\' 4"  (1.626 m)   Weight: 182 lb (82.555 kg)   SpO2:  97%   Gen: Pleasant, well-nourished, in no distress,  normal affect  ENT: No lesions,  mouth clear,  oropharynx clear, no postnasal drip  Neck: No JVD, no TMG, no carotid bruits  Lungs: No use of accessory muscles, no dullness to percussion, clear without rales or rhonchi  Cardiovascular: RRR, heart sounds normal, no murmur or gallops, no peripheral edema  Musculoskeletal: No deformities, no cyanosis or clubbing  Neuro: alert, non focal  Skin: Warm, no lesions or rashes    04/20/15 --  EXAM: CT CHEST WITH CONTRAST  TECHNIQUE: Multidetector CT imaging of the chest was performed during intravenous contrast administration.  CONTRAST: 128mL OMNIPAQUE IOHEXOL 300 MG/ML SOLN  COMPARISON: No  priors.  FINDINGS: Mediastinum/Lymph Nodes: Heart size is normal. There is no significant pericardial fluid, thickening or pericardial calcification. Bovine type thoracic aortic arch (normal anatomical variant) incidentally noted. No pathologically enlarged mediastinal or hilar lymph nodes. Esophagus is unremarkable in appearance. No axillary lymphadenopathy.  Lungs/Pleura: No acute consolidative airspace disease. No pleural effusions. No suspicious appearing pulmonary nodules or masses. Trachea and main airways appear widely patent.  Upper Abdomen: Unremarkable.  Musculoskeletal/Soft Tissues: There are no acute displaced fractures or aggressive appearing lytic or blastic lesions noted in the visualized portions of the skeleton.  IMPRESSION: 1. No acute cardiopulmonary disease identified.     Assessment & Plan:  Cough Her pulmonary function testing today is reassuring without any evidence of obstruction. Her cough did get better when we treated GERD. I believe she needs to be back on Protonix 40 mg daily and will probably need to take this medication indefinitely. We will also start her on loratadine 10 mg daily during the fall and spring months. She  will follow with me as needed

## 2015-08-02 NOTE — Assessment & Plan Note (Signed)
Her pulmonary function testing today is reassuring without any evidence of obstruction. Her cough did get better when we treated GERD. I believe she needs to be back on Protonix 40 mg daily and will probably need to take this medication indefinitely. We will also start her on loratadine 10 mg daily during the fall and spring months. She will follow with me as needed

## 2015-08-02 NOTE — Progress Notes (Signed)
PFT done today. 

## 2015-11-28 MED FILL — PANTOPRAZOLE SOD DR 40 MG T: 40 | 30 days supply | Qty: 30 | Fill #1

## 2016-01-10 MED FILL — PANTOPRAZOLE SOD DR 40 MG T: 40 | 30 days supply | Qty: 30 | Fill #2

## 2016-01-30 ENCOUNTER — Other Ambulatory Visit: Payer: Self-pay

## 2016-01-30 DIAGNOSIS — Z1231 Encounter for screening mammogram for malignant neoplasm of breast: Secondary | ICD-10-CM

## 2016-02-15 ENCOUNTER — Ambulatory Visit
Admission: RE | Admit: 2016-02-15 | Discharge: 2016-02-15 | Disposition: A | Discharge status: Home / Self Care | Payer: 59 | Source: Ambulatory Visit

## 2016-02-15 DIAGNOSIS — Z1231 Encounter for screening mammogram for malignant neoplasm of breast: Secondary | ICD-10-CM | POA: Diagnosis not present

## 2016-02-15 MED FILL — PANTOPRAZOLE SOD DR 40 MG T: 40 | 90 days supply | Qty: 90 | Fill #3

## 2016-02-28 DIAGNOSIS — Z6833 Body mass index (BMI) 33.0-33.9, adult: Secondary | ICD-10-CM | POA: Diagnosis not present

## 2016-02-28 DIAGNOSIS — N951 Menopausal and female climacteric states: Secondary | ICD-10-CM | POA: Diagnosis not present

## 2016-02-28 DIAGNOSIS — Z01419 Encounter for gynecological examination (general) (routine) without abnormal findings: Secondary | ICD-10-CM | POA: Diagnosis not present

## 2016-04-18 ENCOUNTER — Encounter: Payer: 59 | Admitting: Internal Medicine

## 2016-05-01 ENCOUNTER — Encounter: Payer: Self-pay | Admitting: Internal Medicine

## 2016-05-01 ENCOUNTER — Other Ambulatory Visit (INDEPENDENT_AMBULATORY_CARE_PROVIDER_SITE_OTHER): Payer: 59

## 2016-05-01 ENCOUNTER — Ambulatory Visit (INDEPENDENT_AMBULATORY_CARE_PROVIDER_SITE_OTHER): Payer: 59 | Admitting: Internal Medicine

## 2016-05-01 VITALS — BP 130/70 | HR 109 | Temp 98.6°F | Resp 20 | Wt 192.0 lb

## 2016-05-01 DIAGNOSIS — R21 Rash and other nonspecific skin eruption: Secondary | ICD-10-CM | POA: Diagnosis not present

## 2016-05-01 DIAGNOSIS — R6889 Other general symptoms and signs: Secondary | ICD-10-CM

## 2016-05-01 DIAGNOSIS — Z1159 Encounter for screening for other viral diseases: Secondary | ICD-10-CM

## 2016-05-01 DIAGNOSIS — Z0001 Encounter for general adult medical examination with abnormal findings: Secondary | ICD-10-CM

## 2016-05-01 DIAGNOSIS — M545 Low back pain, unspecified: Secondary | ICD-10-CM | POA: Insufficient documentation

## 2016-05-01 DIAGNOSIS — M5442 Lumbago with sciatica, left side: Secondary | ICD-10-CM | POA: Diagnosis not present

## 2016-05-01 DIAGNOSIS — G4700 Insomnia, unspecified: Secondary | ICD-10-CM

## 2016-05-01 DIAGNOSIS — M519 Unspecified thoracic, thoracolumbar and lumbosacral intervertebral disc disorder: Secondary | ICD-10-CM

## 2016-05-01 HISTORY — DX: Insomnia, unspecified: G47.00

## 2016-05-01 HISTORY — DX: Unspecified thoracic, thoracolumbar and lumbosacral intervertebral disc disorder: M51.9

## 2016-05-01 LAB — URINALYSIS, ROUTINE W REFLEX MICROSCOPIC
BILIRUBIN URINE: NEGATIVE
HGB URINE DIPSTICK: NEGATIVE
KETONES UR: NEGATIVE
LEUKOCYTES UA: NEGATIVE
NITRITE: NEGATIVE
PH: 6.5 (ref 5.0–8.0)
RBC / HPF: NONE SEEN (ref 0–?)
Specific Gravity, Urine: 1.015 (ref 1.000–1.030)
TOTAL PROTEIN, URINE-UPE24: NEGATIVE
Urine Glucose: NEGATIVE
Urobilinogen, UA: 1 (ref 0.0–1.0)

## 2016-05-01 LAB — HEPATIC FUNCTION PANEL
ALT: 16 U/L (ref 0–35)
AST: 18 U/L (ref 0–37)
Albumin: 4.4 g/dL (ref 3.5–5.2)
Alkaline Phosphatase: 64 U/L (ref 39–117)
BILIRUBIN DIRECT: 0.1 mg/dL (ref 0.0–0.3)
BILIRUBIN TOTAL: 0.4 mg/dL (ref 0.2–1.2)
Total Protein: 7.4 g/dL (ref 6.0–8.3)

## 2016-05-01 LAB — LIPID PANEL
CHOL/HDL RATIO: 4
Cholesterol: 210 mg/dL — ABNORMAL HIGH (ref 0–200)
HDL: 49.6 mg/dL (ref >39.00)
LDL CALC: 143 mg/dL — AB (ref 0–99)
NONHDL: 160.71
Triglycerides: 87 mg/dL (ref 0.0–149.0)
VLDL: 17.4 mg/dL (ref 0.0–40.0)

## 2016-05-01 LAB — BASIC METABOLIC PANEL
BUN: 13 mg/dL (ref 6–23)
CHLORIDE: 104 meq/L (ref 96–112)
CO2: 30 mEq/L (ref 19–32)
Calcium: 10 mg/dL (ref 8.4–10.5)
Creatinine, Ser: 0.91 mg/dL (ref 0.40–1.20)
GFR: 83.21 mL/min (ref >60.00)
GLUCOSE: 91 mg/dL (ref 70–99)
POTASSIUM: 4.6 meq/L (ref 3.5–5.1)
Sodium: 138 mEq/L (ref 135–145)

## 2016-05-01 LAB — TSH: TSH: 0.53 u[IU]/mL (ref 0.35–4.50)

## 2016-05-01 MED ORDER — TRAZODONE HCL 50 MG PO TABS: 25.0000 mg | ORAL_TABLET | Freq: Every evening | ORAL | Status: DC | PRN

## 2016-05-01 MED ORDER — CYCLOBENZAPRINE HCL 5 MG PO TABS: 5.0000 mg | ORAL_TABLET | Freq: Three times a day (TID) | ORAL | Status: DC | PRN

## 2016-05-01 MED ORDER — TRIAMCINOLONE ACETONIDE 0.1 % EX CREA: 1.0000 "application " | TOPICAL_CREAM | Freq: Two times a day (BID) | CUTANEOUS | Status: DC

## 2016-05-01 MED FILL — traZODone HCL 50 MG TABS: 50 | 90 days supply | Qty: 90 | Fill #0

## 2016-05-01 MED FILL — CYCLOBENZAPRINE 5 MG TABLET: 5 | 10 days supply | Qty: 30 | Fill #0

## 2016-05-01 MED FILL — TRIAMCINOLONE 0.1% CREAM: 0.1 | 15 days supply | Qty: 30 | Fill #0

## 2016-05-01 NOTE — Patient Instructions (Signed)
Please take all new medication as prescribed - the cream for the rash, the trazodone for sleep, and the flexeril (muscle relaxer) for the lower back if needed  Please continue all other medications as before, and refills have been done if requested.  Please have the pharmacy call with any other refills you may need.  Please continue your efforts at being more active, low cholesterol diet, and weight control.  You are otherwise up to date with prevention measures today.  Please keep your appointments with your specialists as you may have planned  Please go to the LAB in the Basement (turn left off the elevator) for the tests to be done today  You will be contacted by phone if any changes need to be made immediately.  Otherwise, you will receive a letter about your results with an explanation, but please check with MyChart first.  Please remember to sign up for MyChart if you have not done so, as this will be important to you in the future with finding out test results, communicating by private email, and scheduling acute appointments online when needed.  Please return in 1 year for your yearly visit, or sooner if needed, with Lab testing done 3-5 days before

## 2016-05-01 NOTE — Assessment & Plan Note (Signed)

## 2016-05-01 NOTE — Progress Notes (Signed)
Subjective:    Patient ID: Jillian Andrade, female    DOB: 1960/12/04, 55 y.o.   MRN: [id number]  HPI  Here for wellness and f/u;  Overall doing ok;  Pt denies Chest pain, worsening SOB, DOE, wheezing, orthopnea, PND, worsening LE edema, palpitations, dizziness or syncope.  Pt denies neurological change such as new headache, facial or extremity weakness.  Pt denies polydipsia, polyuria, or low sugar symptoms. Pt states overall good compliance with treatment and medications, good tolerability, and has been trying to follow appropriate diet.  Pt denies worsening depressive symptoms, suicidal ideation or panic.Pt states good ability with ADL's, has low fall risk, home safety reviewed and adequate, no other significant changes in hearing or vision, and only occasionally active with exercise.  Has marked difficulty with sleep ongoing for > 1 yr, using unisom otc but often does not help, even with using lavender sprays and candles.  Has tried 2 rx med that seemed to make her wired (robaxin and [place]).  Pt denies fever, wt loss, night sweats, loss of appetite, or other constitutional symptoms  Denies worsening depressive symptoms, suicidal ideation, or panic; has ongoing anxiety, not increased recently except for stress related to mother with dementia.  Pt with 3 days onset left LBP without change in severity, bowel or bladder change, fever, wt loss,  worsening LE pain/numbness/weakness, gait change or falls. Has hx of lumbar disc dz and sciatica but none this time  ALso with 2-3 wks onset itchy scaly rash to right neck base, cant stop scatching. Past Medical History  Diagnosis Date  . Complication of anesthesia     n/v  . PONV (postoperative nausea and vomiting)   . No pertinent past medical history   . MVP (mitral valve prolapse) mild, no cardiologist  . Fibroids in ovaries     pain in left side  . Lumbar disc disease 05/01/2016   Past Surgical History  Procedure Laterality Date  . Abdominal  hysterectomy    . Dilation and curettage of uterus    . Hemorrhoid surgery    . Colonoscopy    . Ankle arthroscopy  10/31/2011    Procedure: ANKLE ARTHROSCOPY;  Surgeon: Colin Rhein, MD;  Location: [facility];  Service: Orthopedics;  Laterality: Right;  RIGHT ANKLE ARTHROSCOPY WITH EXTENSIVE DEBRIDEMENT  . Colonoscopy N/A 03/02/2014    Procedure: COLONOSCOPY;  Surgeon: Cleotis Nipper, MD;  Location: [facility];  Service: Endoscopy;  Laterality: N/A;  ultra slim scope    reports that she has been passively smoking.  She has never used smokeless tobacco. She reports that she does not drink alcohol or use illicit drugs. family history includes Glaucoma in her mother; Hypertension in her father; Stroke in her father. Allergies  Allergen Reactions  . Bee Venom Anaphylaxis  . Eggs Or Egg-Derived Products Nausea And Vomiting  . Orange Juice [Orange Oil] Hives and Nausea And Vomiting  . Oxycodone Hives  . Wygesic [Propoxyphene N-Acetaminophen] Nausea And Vomiting   Current Outpatient Prescriptions on File Prior to Visit  Medication Sig Dispense Refill  . loratadine (CLARITIN) 10 MG tablet Take 1 tablet (10 mg total) by mouth daily. 90 tablet 3  . Multiple Vitamins-Minerals (ALIVE WOMENS ENERGY) TABS Take 1 tablet by mouth daily.     No current facility-administered medications on file prior to visit.   Review of Systems Constitutional: Negative for increased diaphoresis, or other activity, appetite or siginficant weight change other than noted HENT: Negative for worsening hearing  loss, ear pain, facial swelling, mouth sores and neck stiffness.   Eyes: Negative for other worsening pain, redness or visual disturbance.  Respiratory: Negative for choking or stridor Cardiovascular: Negative for other chest pain and palpitations.  Gastrointestinal: Negative for worsening diarrhea, blood in stool, or abdominal distention Genitourinary: Negative for hematuria, flank pain or  change in urine volume.  Musculoskeletal: Negative for myalgias or other joint complaints.  Skin: Negative for other color change and wound or drainage.  Neurological: Negative for syncope and numbness. other than noted Hematological: Negative for adenopathy. or other swelling Psychiatric/Behavioral: Negative for hallucinations, SI, self-injury, decreased concentration or other worsening agitation.      Objective:   Physical Exam BP 130/70 mmHg  Pulse 109  Temp(Src) 98.6 F (37 C) (Oral)  Resp 20  Wt 192 lb (87.091 kg)  SpO2 96% VS noted,  Constitutional: Pt is oriented to person, place, and time. Appears well-developed and well-nourished, in no significant distress Head: Normocephalic and atraumatic  Eyes: Conjunctivae and EOM are normal. Pupils are equal, round, and reactive to light Right Ear: External ear normal.  Left Ear: External ear normal Nose: Nose normal.  Mouth/Throat: Oropharynx is clear and moist  Neck: Normal range of motion. Neck supple. No JVD present. No tracheal deviation present or significant neck LA or mass Cardiovascular: Normal rate, regular rhythm, normal heart sounds and intact distal pulses.   Pulmonary/Chest: Effort normal and breath sounds without rales or wheezing  Abdominal: Soft. Bowel sounds are normal. NT. No HSM  Musculoskeletal: Normal range of motion. Exhibits no edema Lymphadenopathy: Has no cervical adenopathy.  Neurological: Pt is alert and oriented to person, place, and time. Pt has normal reflexes. No cranial nerve deficit. Motor grossly intact Skin: Skin is warm and dry. + eczematous rash to 3 cm area right lateral base of neck, no new ulcers + left lumbar paravertebral muscular tender/spasm Psychiatric:  Has normal mood and affect. Behavior is normal.   Stress Test April 26, 2016 Study Highlights     The left ventricular ejection fraction is hyperdynamic (>65%).  Nuclear stress EF: 66%.  There was no ST segment deviation noted  during stress.  The study is normal.  This is a low risk study.  Threre is no ischemia identified.        Assessment & Plan:

## 2016-05-01 NOTE — Progress Notes (Signed)
Pre visit review using our clinic review tool, if applicable. No additional management support is needed unless otherwise documented below in the visit note. 

## 2016-05-01 NOTE — Assessment & Plan Note (Signed)
Chronic persistent, for trazodone qhs prn,  to f/u any worsening symptoms or concerns

## 2016-05-01 NOTE — Assessment & Plan Note (Signed)
Scaly eczema type rash right lateral base of neck - for triam cr prn

## 2016-05-01 NOTE — Assessment & Plan Note (Signed)
Mild, c/w msk strain, for flexeril prn,  to f/u any worsening symptoms or concerns j

## 2016-05-02 LAB — CBC WITH DIFFERENTIAL/PLATELET
Basophils Absolute: 0 10*3/uL (ref 0.0–0.1)
Basophils Relative: 0.3 % (ref 0.0–3.0)
EOS ABS: 0.1 10*3/uL (ref 0.0–0.7)
Eosinophils Relative: 1.8 % (ref 0.0–5.0)
HCT: 40.6 % (ref 36.0–46.0)
Hemoglobin: 13.6 g/dL (ref 12.0–15.0)
Lymphocytes Relative: 24.6 % (ref 12.0–46.0)
Lymphs Abs: 1.8 10*3/uL (ref 0.7–4.0)
MCHC: 33.4 g/dL (ref 30.0–36.0)
MCV: 88.1 fl (ref 78.0–100.0)
MONO ABS: 0.4 10*3/uL (ref 0.1–1.0)
Monocytes Relative: 5.5 % (ref 3.0–12.0)
NEUTROS PCT: 67.8 % (ref 43.0–77.0)
Neutro Abs: 5.1 10*3/uL (ref 1.4–7.7)
Platelets: 149 10*3/uL — ABNORMAL LOW (ref 150.0–400.0)
RBC: 4.61 Mil/uL (ref 3.87–5.11)
RDW: 13.8 % (ref 11.5–15.5)
WBC: 7.5 10*3/uL (ref 4.0–10.5)

## 2016-05-02 LAB — HEPATITIS C ANTIBODY: HCV Ab: NEGATIVE

## 2016-05-24 ENCOUNTER — Telehealth: Payer: Self-pay

## 2016-05-24 NOTE — Telephone Encounter (Signed)
This has been faxed.

## 2016-05-24 NOTE — Telephone Encounter (Signed)
Done hardcopy to Corinne  

## 2016-05-24 NOTE — Telephone Encounter (Signed)
Called and LVM for patient that her letter is here for pick up!

## 2016-05-24 NOTE — Telephone Encounter (Signed)
Patient is requesting a note saying the doctor said it would be good for her to have back massager. If she has note from her doctor she can use her benny card to pay for it. Fax number is [phone]

## 2016-06-11 ENCOUNTER — Encounter: Payer: Self-pay | Admitting: Skilled Nursing Facility1

## 2016-06-11 ENCOUNTER — Encounter: Payer: 59 | Attending: Internal Medicine | Admitting: Skilled Nursing Facility1

## 2016-06-11 DIAGNOSIS — Z713 Dietary counseling and surveillance: Secondary | ICD-10-CM | POA: Insufficient documentation

## 2016-06-11 DIAGNOSIS — E669 Obesity, unspecified: Secondary | ICD-10-CM
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
## 2016-06-21 MED FILL — AMOXICILLIN 500 MG CAPSULE: 500 | 3 days supply | Qty: 12 | Fill #0

## 2016-07-10 ENCOUNTER — Ambulatory Visit (INDEPENDENT_AMBULATORY_CARE_PROVIDER_SITE_OTHER): Payer: 59 | Admitting: Internal Medicine

## 2016-07-10 DIAGNOSIS — M542 Cervicalgia: Secondary | ICD-10-CM | POA: Diagnosis not present

## 2016-07-10 MED ORDER — CYCLOBENZAPRINE HCL 5 MG PO TABS: 5.0000 mg | ORAL_TABLET | Freq: Three times a day (TID) | ORAL | 2 refills | Status: DC | PRN

## 2016-07-10 MED ORDER — TRAMADOL HCL 50 MG PO TABS: 50.0000 mg | ORAL_TABLET | Freq: Four times a day (QID) | ORAL | 0 refills | Status: DC | PRN

## 2016-07-10 MED ORDER — PREDNISONE 10 MG PO TABS: ORAL_TABLET | ORAL | 0 refills | Status: DC

## 2016-07-10 MED FILL — predniSONE 10 MG TABS: 10 | 9 days supply | Qty: 18 | Fill #0

## 2016-07-10 MED FILL — CYCLOBENZAPRINE 5 MG TABLET: 5 | 10 days supply | Qty: 30 | Fill #0

## 2016-07-10 NOTE — Assessment & Plan Note (Signed)
Pain c/w likely underlying cervical djd/ddd, no neuro changes, cant possible occipital neuralgia component, for tramadol prn, predpac asd, and consider MRI and/or NS referral if not improved

## 2016-07-10 NOTE — Progress Notes (Signed)
Subjective:    Patient ID: Jillian Andrade, female    DOB: 1961-04-27, 55 y.o.   MRN: [id number]  HPI    Here with c/o HA's recent onset x 2 wks, pt had a massage sept 11, then noteiced over next few days upper bilat neck pain, squeezing, pressure like, sharp and dull with tingling radiating to the bilat occipital areas, as well as bilat lower lateral neck as well, but not assoc with radicular symptoms.  Seemed worse in last weeks with weather change, now nearly daily worsening, sometimes assoc with dizziness. Last sat night was severe, since some improved.  Pt denies new neurological symptoms such as new facial or extremity weakness or numbness Has also had spasms in the back and incresaed stress at work, lack of sleep as well.  Denies worsening depressive symptoms, suicidal ideation, or panic Past Medical History:  Diagnosis Date  . Complication of anesthesia    n/v  . Fibroids in ovaries    pain in left side  . Insomnia 05/01/2016  . Lumbar disc disease 05/01/2016  . MVP (mitral valve prolapse) mild, no cardiologist  . No pertinent past medical history   . PONV (postoperative nausea and vomiting)    Past Surgical History:  Procedure Laterality Date  . ABDOMINAL HYSTERECTOMY    . ANKLE ARTHROSCOPY  10/31/2011   Procedure: ANKLE ARTHROSCOPY;  Surgeon: Colin Rhein, MD;  Location: [facility];  Service: Orthopedics;  Laterality: Right;  RIGHT ANKLE ARTHROSCOPY WITH EXTENSIVE DEBRIDEMENT  . COLONOSCOPY    . COLONOSCOPY N/A 03/02/2014   Procedure: COLONOSCOPY;  Surgeon: Cleotis Nipper, MD;  Location: [facility];  Service: Endoscopy;  Laterality: N/A;  ultra slim scope  . DILATION AND CURETTAGE OF UTERUS    . HEMORRHOID SURGERY      reports that she is a non-smoker but has been exposed to tobacco smoke. She has never used smokeless tobacco. She reports that she does not drink alcohol or use drugs. family history includes Glaucoma in her mother; Hypertension in her father;  Stroke in her father. Allergies  Allergen Reactions  . Bee Venom Anaphylaxis  . Eggs Or Egg-Derived Products Nausea And Vomiting  . Orange Juice [Orange Oil] Hives and Nausea And Vomiting  . Oxycodone Hives  . Wygesic [Propoxyphene N-Acetaminophen] Nausea And Vomiting   Current Outpatient Prescriptions on File Prior to Visit  Medication Sig Dispense Refill  . Multiple Vitamins-Minerals (ALIVE WOMENS ENERGY) TABS Take 1 tablet by mouth daily.    . traZODone (DESYREL) 50 MG tablet Take 0.5-1 tablets (25-50 mg total) by mouth at bedtime as needed for sleep. 90 tablet 1  . triamcinolone cream (KENALOG) 0.1 % Apply 1 application topically 2 (two) times daily. 30 g 0   No current facility-administered medications on file prior to visit.     Review of Systems  Constitutional: Negative for unusual diaphoresis or night sweats HENT: Negative for ear swelling or discharge Eyes: Negative for worsening visual haziness  Respiratory: Negative for choking and stridor.   Gastrointestinal: Negative for distension or worsening eructation Genitourinary: Negative for retention or change in urine volume.  Musculoskeletal: Negative for other MSK pain or swelling Skin: Negative for color change and worsening wound Neurological: Negative for tremors and numbness other than noted  Psychiatric/Behavioral: Negative for decreased concentration or agitation other than above       Objective:   Physical Exam BP 130/74   Pulse 67   Temp 98.6 F (37 C) (Oral)  Resp 20   Wt 190 lb 4 oz (86.3 kg)   SpO2 97%   BMI 32.66 kg/m  VS noted,  Constitutional: Pt appears in no apparent distress HENT: Head: NCAT.  Right Ear: External ear normal.  Left Ear: External ear normal.  Eyes: . Pupils are equal, round, and reactive to light. Conjunctivae and EOM are normal Neck: Normal range of motion. Neck supple.but with bilat upper cervical tenderness without rash, swelling Spine; o/w nontender in midline    Cardiovascular: Normal rate and regular rhythm.   Pulmonary/Chest: Effort normal and breath sounds without rales or wheezing.  Neurological: Pt is alert. Not confused , motor grossly intact Skin: Skin is warm. No rash, no LE edema Psychiatric: Pt behavior is normal. No agitation.     Assessment & Plan:

## 2016-07-10 NOTE — Progress Notes (Signed)
Pre visit review using our clinic review tool, if applicable. No additional management support is needed unless otherwise documented below in the visit note. 

## 2016-07-10 NOTE — Patient Instructions (Signed)
Please take all new medication as prescribed - the pain medication, as well as the prednisone  Please continue all other medications as before, including the flexeril  Please have the pharmacy call with any other refills you may need.  Please keep your appointments with your specialists as you may have planned

## 2016-07-12 MED FILL — traMADol HCL 50 MG TABS: 50 | 15 days supply | Qty: 60 | Fill #0

## 2016-07-13 ENCOUNTER — Emergency Department (HOSPITAL_COMMUNITY): Payer: 59

## 2016-07-13 ENCOUNTER — Emergency Department (HOSPITAL_COMMUNITY)
Admission: EM | Admit: 2016-07-13 | Discharge: 2016-07-13 | Disposition: A | Discharge status: Home / Self Care | Payer: 59 | Attending: Emergency Medicine | Admitting: Emergency Medicine

## 2016-07-13 ENCOUNTER — Encounter (HOSPITAL_COMMUNITY): Payer: Self-pay | Admitting: Emergency Medicine

## 2016-07-13 DIAGNOSIS — Z7722 Contact with and (suspected) exposure to environmental tobacco smoke (acute) (chronic): Secondary | ICD-10-CM | POA: Insufficient documentation

## 2016-07-13 DIAGNOSIS — R51 Headache: Secondary | ICD-10-CM | POA: Insufficient documentation

## 2016-07-13 DIAGNOSIS — M542 Cervicalgia: Secondary | ICD-10-CM | POA: Insufficient documentation

## 2016-07-13 DIAGNOSIS — R519 Headache, unspecified: Secondary | ICD-10-CM

## 2016-07-13 DIAGNOSIS — R0602 Shortness of breath: Secondary | ICD-10-CM | POA: Insufficient documentation

## 2016-07-13 LAB — URINALYSIS, ROUTINE W REFLEX MICROSCOPIC
Bilirubin Urine: NEGATIVE
GLUCOSE, UA: NEGATIVE mg/dL
Hgb urine dipstick: NEGATIVE
Ketones, ur: NEGATIVE mg/dL
LEUKOCYTES UA: NEGATIVE
NITRITE: NEGATIVE
PH: 5.5 (ref 5.0–8.0)
Protein, ur: NEGATIVE mg/dL
SPECIFIC GRAVITY, URINE: 1.04 — AB (ref 1.005–1.030)

## 2016-07-13 LAB — CBC WITH DIFFERENTIAL/PLATELET
Basophils Absolute: 0 10*3/uL (ref 0.0–0.1)
Basophils Relative: 0 %
EOS ABS: 0.1 10*3/uL (ref 0.0–0.7)
EOS PCT: 1 %
HCT: 42.9 % (ref 36.0–46.0)
Hemoglobin: 14.1 g/dL (ref 12.0–15.0)
LYMPHS ABS: 3.2 10*3/uL (ref 0.7–4.0)
LYMPHS PCT: 24 %
MCH: 29.6 pg (ref 26.0–34.0)
MCHC: 32.9 g/dL (ref 30.0–36.0)
MCV: 90.1 fL (ref 78.0–100.0)
MONOS PCT: 6 %
Monocytes Absolute: 0.8 10*3/uL (ref 0.1–1.0)
NEUTROS PCT: 69 %
Neutro Abs: 9.1 10*3/uL — ABNORMAL HIGH (ref 1.7–7.7)
PLATELETS: 165 10*3/uL (ref 150–400)
RBC: 4.76 MIL/uL (ref 3.87–5.11)
RDW: 12.9 % (ref 11.5–15.5)
WBC: 13.2 10*3/uL — AB (ref 4.0–10.5)

## 2016-07-13 LAB — I-STAT TROPONIN, ED
TROPONIN I, POC: 0 ng/mL (ref 0.00–0.08)
TROPONIN I, POC: 0.01 ng/mL (ref 0.00–0.08)

## 2016-07-13 LAB — BASIC METABOLIC PANEL
Anion gap: 10 (ref 5–15)
BUN: 16 mg/dL (ref 6–20)
CHLORIDE: 103 mmol/L (ref 101–111)
CO2: 27 mmol/L (ref 22–32)
CREATININE: 1.16 mg/dL — AB (ref 0.44–1.00)
Calcium: 10.1 mg/dL (ref 8.9–10.3)
GFR calc Af Amer: >60 mL/min (ref >60)
GFR calc non Af Amer: 53 mL/min — ABNORMAL LOW (ref >60)
GLUCOSE: 110 mg/dL — AB (ref 65–99)
POTASSIUM: 3.3 mmol/L — AB (ref 3.5–5.1)
SODIUM: 140 mmol/L (ref 135–145)

## 2016-07-13 LAB — D-DIMER, QUANTITATIVE: D-Dimer, Quant: 0.27 ug/mL-FEU (ref 0.00–0.50)

## 2016-07-13 MED ORDER — PROCHLORPERAZINE EDISYLATE 5 MG/ML IJ SOLN
10.0000 mg | Freq: Once | INTRAMUSCULAR | Status: AC
  Administered 2016-07-13: 10 mg via INTRAVENOUS
  Filled 2016-07-13: qty 2

## 2016-07-13 MED ORDER — IOPAMIDOL (ISOVUE-370) INJECTION 76%
INTRAVENOUS | Status: AC
  Administered 2016-07-13: 50 mL
  Filled 2016-07-13: qty 50

## 2016-07-13 MED ORDER — SODIUM CHLORIDE 0.9 % IV BOLUS (SEPSIS)
1000.0000 mL | Freq: Once | INTRAVENOUS | Status: AC
  Administered 2016-07-13: 1000 mL via INTRAVENOUS

## 2016-07-13 MED ORDER — POTASSIUM CHLORIDE CRYS ER 20 MEQ PO TBCR
20.0000 meq | EXTENDED_RELEASE_TABLET | Freq: Once | ORAL | Status: AC
  Administered 2016-07-13: 20 meq via ORAL
  Filled 2016-07-13: qty 1

## 2016-07-13 MED ORDER — DIPHENHYDRAMINE HCL 50 MG/ML IJ SOLN
25.0000 mg | Freq: Once | INTRAMUSCULAR | Status: AC
  Administered 2016-07-13: 25 mg via INTRAVENOUS
  Filled 2016-07-13: qty 1

## 2016-07-13 NOTE — ED Notes (Signed)
Pt in CT at this time.

## 2016-07-13 NOTE — ED Triage Notes (Signed)
Pt st's she has had a headache off and on for several days.  St's today while at work she had a headache and when walking back to to desk she developed shortness of breath.  Pt denies shortness of breath at this time but continues to have headache

## 2016-07-13 NOTE — ED Provider Notes (Signed)
[facility] DEPT Provider Note   CSN: [id number] Arrival date & time: 07/13/16  1552     History   Chief Complaint No chief complaint on file. Headache, neck pain, shortness of breath  HPI Jillian Andrade is a 55 y.o. female with a past medical history significant for mitral valve prolapse and remote history of migraines who presents with neck pain, headache, and shortness of breath. 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 She reports that last week, she was having some low back pain which is due to reported bulging disks prompting her to get a massage. Patient reports she had a 90 minute massage worthiness used worked on her neck as well. Patient said that the next day after the massage, she began having severe neck pain radiating into her head. She described the pain as a 12 out of 10 at its worst and 6 out of 10 currently. She denies photophobia or phonophobia. She denies any neurologic deficits including no numbness, tingling, weakness, or coordination problems. She went to her PCP where she was started on prednisone, Flexeril, and Ultram rate likely musculoskeletal pain. She also was referred to a neurologist for further workup. She says that her pain unfortunately has continued to worsen. She reports that today, while at work, her pain intensified prompting her to seek evaluation in emergency.   Patient also reports that while returning from a delivery task today, she had approximately 10-15 minutes of severe shortness of breath with wheezing. She has no diagnosis of asthma or other wheezing disorder. She reports the shortness of breath was worsened with her exertion and resolved after rest. She denied chest pain, nausea, vomiting, diaphoresis, abdominal pain. She denies recent fevers, chills, constipation, diarrhea, dysuria. She denies nuchal rigidity but does report pain when turning her head to the left or right.   Headache   This is a new  problem. The current episode started more than 1 week ago. The problem occurs constantly. The problem has been gradually worsening. The headache is associated with nothing. The pain is located in the bilateral, occipital and frontal region. The quality of the pain is described as dull. The pain is at a severity of 6/10. The pain is severe. The pain does not radiate. Associated symptoms include shortness of breath. Pertinent negatives include no fever, no chest pressure, no near-syncope, no palpitations, no syncope, no nausea and no vomiting. The treatment provided no relief.  Shortness of Breath  This is a new problem. The average episode lasts 15 minutes. The problem occurs intermittently.The current episode started 1 to 2 hours ago. The problem has been resolved. Associated symptoms include headaches, neck pain and wheezing. Pertinent negatives include no fever, no rhinorrhea, no cough, no sputum production, no hemoptysis, no chest pain, no syncope, no vomiting, no leg pain and no leg swelling. The problem's precipitants include exercise (pt just walked at work). She has tried nothing for the symptoms. Associated medical issues do not include asthma, COPD, PE, CAD, past MI or DVT.    Past Medical History:  Diagnosis Date  . Complication of anesthesia    n/v  . Fibroids in ovaries    pain in left side  . Insomnia 05/01/2016  . Lumbar disc disease 05/01/2016  . MVP (mitral valve prolapse) mild, no cardiologist  . No pertinent past medical history   . PONV (postoperative nausea and vomiting)     Patient Active Problem List   Diagnosis  Date Noted  . Cervical pain (neck) 07/10/2016  . Lumbar disc disease 05/01/2016  . Lower back pain 05/01/2016  . Insomnia 05/01/2016  . Encounter for well adult exam with abnormal findings 04/12/2015  . Chest pain 04/12/2015  . Hemoptysis 04/12/2015  . Fibroids     Past Surgical History:  Procedure Laterality Date  . ABDOMINAL HYSTERECTOMY    . ANKLE  ARTHROSCOPY  10/31/2011   Procedure: ANKLE ARTHROSCOPY;  Surgeon: Colin Rhein, MD;  Location: [facility];  Service: Orthopedics;  Laterality: Right;  RIGHT ANKLE ARTHROSCOPY WITH EXTENSIVE DEBRIDEMENT  . COLONOSCOPY    . COLONOSCOPY N/A 03/02/2014   Procedure: COLONOSCOPY;  Surgeon: Cleotis Nipper, MD;  Location: [facility];  Service: Endoscopy;  Laterality: N/A;  ultra slim scope  . DILATION AND CURETTAGE OF UTERUS    . HEMORRHOID SURGERY      OB History    No data available       Home Medications    Prior to Admission medications   Medication Sig Start Date End Date Taking? Authorizing Provider  cyclobenzaprine (FLEXERIL) 5 MG tablet Take 1 tablet (5 mg total) by mouth 3 (three) times daily as needed for muscle spasms. 07/10/16   Biagio Borg, MD  Multiple Vitamins-Minerals (ALIVE WOMENS ENERGY) TABS Take 1 tablet by mouth daily.    Historical Provider, MD  predniSONE (DELTASONE) 10 MG tablet 3 tabs by mouth per day for 3 days,2tabs per day for 3 days,1tab per day for 3 days 07/10/16   Biagio Borg, MD  traMADol (ULTRAM) 50 MG tablet Take 1 tablet (50 mg total) by mouth every 6 (six) hours as needed. 07/10/16   Biagio Borg, MD  traZODone (DESYREL) 50 MG tablet Take 0.5-1 tablets (25-50 mg total) by mouth at bedtime as needed for sleep. 05/01/16   Biagio Borg, MD  triamcinolone cream (KENALOG) 0.1 % Apply 1 application topically 2 (two) times daily. 05/01/16   Biagio Borg, MD    Family History Family History  Problem Relation Age of Onset  . Stroke Father   . Hypertension Father   . Glaucoma Mother     Social History Social History  Substance Use Topics  . Smoking status: Passive Smoke Exposure - Never Smoker  . Smokeless tobacco: Never Used  . Alcohol use No     Allergies   Bee venom; Eggs or egg-derived products; Orange juice [orange oil]; Oxycodone; and Wygesic [propoxyphene n-acetaminophen]   Review of Systems Review of Systems  Constitutional:  Negative for chills, diaphoresis, fatigue and fever.  HENT: Negative for congestion and rhinorrhea.   Eyes: Negative for photophobia and visual disturbance.  Respiratory: Positive for shortness of breath and wheezing. Negative for cough, hemoptysis, sputum production, chest tightness and stridor.   Cardiovascular: Negative for chest pain, palpitations, leg swelling, syncope and near-syncope.  Gastrointestinal: Negative for constipation, diarrhea, nausea and vomiting.  Genitourinary: Negative for dysuria, flank pain and frequency.  Musculoskeletal: Positive for neck pain. Negative for back pain and neck stiffness.  Skin: Negative for wound.  Neurological: Positive for headaches. Negative for dizziness, tremors, seizures, syncope, facial asymmetry, weakness, light-headedness and numbness.  Psychiatric/Behavioral: Negative for agitation.  All other systems reviewed and are negative.    Physical Exam Updated Vital Signs BP 137/80   Pulse 68   Temp 98.4 F (36.9 C) (Oral)   Resp 18   SpO2 99%   Physical Exam  Constitutional: She is oriented to person, place, and  time. She appears well-developed and well-nourished. No distress.  HENT:  Head: Normocephalic and atraumatic.  Right Ear: External ear normal.  Left Ear: External ear normal.  Mouth/Throat: Oropharynx is clear and moist. No oropharyngeal exudate.  Eyes: Conjunctivae and EOM are normal. Pupils are equal, round, and reactive to light.  Neck: Normal range of motion. Neck supple.  Pain with lateral rotation of neck. No pain with up or downward movement. No nuchal rigidity.   Cardiovascular: Normal rate and regular rhythm.   No murmur heard. Pulmonary/Chest: Effort normal and breath sounds normal. No stridor. No respiratory distress. She has no wheezes. She has no rales. She exhibits no tenderness.  Abdominal: Soft. There is no tenderness.  Musculoskeletal: She exhibits no edema or tenderness.  Neurological: She is alert and  oriented to person, place, and time. She has normal reflexes. She is not disoriented. She displays no tremor and normal reflexes. No cranial nerve deficit or sensory deficit. She exhibits normal muscle tone. Coordination normal. GCS eye subscore is 4. GCS verbal subscore is 5. GCS motor subscore is 6.  Skin: Skin is warm and dry. No rash noted.  Psychiatric: She has a normal mood and affect.  Nursing note and vitals reviewed.    ED Treatments / Results  Labs (all labs ordered are listed, but only abnormal results are displayed) Labs Reviewed  CBC WITH DIFFERENTIAL/PLATELET - Abnormal; Notable for the following:       Result Value   WBC 13.2 (*)    Neutro Abs 9.1 (*)    All other components within normal limits  BASIC METABOLIC PANEL - Abnormal; Notable for the following:    Potassium 3.3 (*)    Glucose, Bld 110 (*)    Creatinine, Ser 1.16 (*)    GFR calc non Af Amer 53 (*)    All other components within normal limits  URINALYSIS, ROUTINE W REFLEX MICROSCOPIC (NOT AT [facility]) - Abnormal; Notable for the following:    Specific Gravity, Urine 1.040 (*)    All other components within normal limits  D-DIMER, QUANTITATIVE (NOT AT [facility])  I-STAT TROPOININ, ED  I-STAT TROPOININ, ED    EKG  EKG Interpretation  Date/Time:  Friday July 13 2016 16:10:18 EDT Ventricular Rate:  65 PR Interval:    QRS Duration: 94 QT Interval:  409 QTC Calculation: 426 R Axis:   67 Text Interpretation:  Sinus rhythm Consider right atrial enlargement ST elev, probable normal early repol pattern T waves now upward in lead 3.  Confirmed by Sherry Ruffing MD, [place] [phone]) on 07/13/2016 4:37:42 PM       Radiology Ct Angio Head W Or Wo Contrast  Result Date: 07/13/2016 CLINICAL DATA:  Neck pain, headache and shortness of breath. EXAM: CT ANGIOGRAPHY HEAD AND NECK TECHNIQUE: Multidetector CT imaging of the head and neck was performed using the standard protocol during bolus administration of intravenous  contrast. Multiplanar CT image reconstructions and MIPs were obtained to evaluate the vascular anatomy. Carotid stenosis measurements (when applicable) are obtained utilizing NASCET criteria, using the distal internal carotid diameter as the denominator. CONTRAST:  50 mL Isovue 370 IV COMPARISON:  None. FINDINGS: CT HEAD FINDINGS Brain: No mass lesion, intraparenchymal hemorrhage or extra-axial collection. No evidence of acute infarct. No midline shift or hydrocephalus. Brain parenchyma is normal. Ventricles and sulci are normal for patient age. Vascular: No hyperdense vessel or unexpected calcification. Skull: No fracture or destructive lesion. Mastoids and middle ears are clear. Sinuses: Imaged portions are clear. Orbits: No  acute finding. Review of the MIP images confirms the above findings CTA NECK FINDINGS Aortic arch: There is a bovine variant aortic arch branching pattern. The proximal subclavian arteries are normal. The visualized portion of the thoracic aorta is normal. Right carotid system: No stenosis, occlusion or significant burden of atherosclerotic disease. Left carotid system: No stenosis, occlusion or significant burden of atherosclerotic disease. Vertebral arteries: Both vertebral oro artery origins are widely patent. The bilateral V1 and V2 segments are normal. Skeleton: No bony spinal canal stenosis. No lytic or blastic lesions. Multilevel facet hypertrophy and cervical osteophytosis. Other neck: There is prominence of the adenoid tonsils, filling the nasopharynx. The remainder the pharynx and larynx are unremarkable. Thyroid is normal. No cervical adenopathy. The salivary glands are normal. Upper chest: Unremarkable Review of the MIP images confirms the above findings CTA HEAD FINDINGS Anterior circulation: The intracranial internal carotid artery is are normal. The bilateral anterior and middle cerebral arteries are normal. Distal ACA and MCA circulation is symmetric. There are bilateral  diminutive posterior communicating arteries. Posterior circulation: The V3 and V4 segments of the vertebral arteries are normal to the confluence with the basilar artery. The basilar artery is normal. Both posterior cerebral arteries and superior cerebellar arteries are normal. Left anterior and bilateral posterior inferior cerebellar arteries are normal. The right anterior inferior cerebellar artery is not visualized, which is not uncommon. Venous sinuses: As permitted by contrast timing, patent. Anatomic variants: None Delayed phase: No abnormal intracranial enhancement. Review of the MIP images confirms the above findings IMPRESSION: Normal CTA of the head and neck. Electronically Signed   By: Ulyses Jarred M.D.   On: 07/13/2016 18:58   Dg Chest 2 View  Result Date: 07/13/2016 CLINICAL DATA:  55 year old female with headache and neck pain. Initial encounter. EXAM: CHEST  2 VIEW COMPARISON:  Chest CT 04/19/2016 and earlier. FINDINGS: Lung volumes are stable with mild elevation of the right hemidiaphragm. Normal cardiac size and mediastinal contours. Visualized tracheal air column is within normal limits. No pneumothorax, pulmonary edema, pleural effusion or confluent pulmonary opacity. No acute osseous abnormality identified. IMPRESSION: No acute cardiopulmonary abnormality. Electronically Signed   By: Genevie Ann M.D.   On: 07/13/2016 18:07   Ct Angio Neck W Or Wo Contrast  Result Date: 07/13/2016 CLINICAL DATA:  Neck pain, headache and shortness of breath. EXAM: CT ANGIOGRAPHY HEAD AND NECK TECHNIQUE: Multidetector CT imaging of the head and neck was performed using the standard protocol during bolus administration of intravenous contrast. Multiplanar CT image reconstructions and MIPs were obtained to evaluate the vascular anatomy. Carotid stenosis measurements (when applicable) are obtained utilizing NASCET criteria, using the distal internal carotid diameter as the denominator. CONTRAST:  50 mL Isovue 370  IV COMPARISON:  None. FINDINGS: CT HEAD FINDINGS Brain: No mass lesion, intraparenchymal hemorrhage or extra-axial collection. No evidence of acute infarct. No midline shift or hydrocephalus. Brain parenchyma is normal. Ventricles and sulci are normal for patient age. Vascular: No hyperdense vessel or unexpected calcification. Skull: No fracture or destructive lesion. Mastoids and middle ears are clear. Sinuses: Imaged portions are clear. Orbits: No acute finding. Review of the MIP images confirms the above findings CTA NECK FINDINGS Aortic arch: There is a bovine variant aortic arch branching pattern. The proximal subclavian arteries are normal. The visualized portion of the thoracic aorta is normal. Right carotid system: No stenosis, occlusion or significant burden of atherosclerotic disease. Left carotid system: No stenosis, occlusion or significant burden of atherosclerotic disease. Vertebral arteries: Both  vertebral oro artery origins are widely patent. The bilateral V1 and V2 segments are normal. Skeleton: No bony spinal canal stenosis. No lytic or blastic lesions. Multilevel facet hypertrophy and cervical osteophytosis. Other neck: There is prominence of the adenoid tonsils, filling the nasopharynx. The remainder the pharynx and larynx are unremarkable. Thyroid is normal. No cervical adenopathy. The salivary glands are normal. Upper chest: Unremarkable Review of the MIP images confirms the above findings CTA HEAD FINDINGS Anterior circulation: The intracranial internal carotid artery is are normal. The bilateral anterior and middle cerebral arteries are normal. Distal ACA and MCA circulation is symmetric. There are bilateral diminutive posterior communicating arteries. Posterior circulation: The V3 and V4 segments of the vertebral arteries are normal to the confluence with the basilar artery. The basilar artery is normal. Both posterior cerebral arteries and superior cerebellar arteries are normal. Left  anterior and bilateral posterior inferior cerebellar arteries are normal. The right anterior inferior cerebellar artery is not visualized, which is not uncommon. Venous sinuses: As permitted by contrast timing, patent. Anatomic variants: None Delayed phase: No abnormal intracranial enhancement. Review of the MIP images confirms the above findings IMPRESSION: Normal CTA of the head and neck. Electronically Signed   By: Ulyses Jarred M.D.   On: 07/13/2016 18:58    Procedures Procedures (including critical care time)  Medications Ordered in ED Medications  sodium chloride 0.9 % bolus 1,000 mL (0 mLs Intravenous Stopped 07/13/16 2215)  diphenhydrAMINE (BENADRYL) injection 25 mg (25 mg Intravenous Given 07/13/16 1657)  prochlorperazine (COMPAZINE) injection 10 mg (10 mg Intravenous Given 07/13/16 1658)  iopamidol (ISOVUE-370) 76 % injection (50 mLs  Contrast Given 07/13/16 1807)  potassium chloride SA (K-DUR,KLOR-CON) CR tablet 20 mEq (20 mEq Oral Given 07/13/16 2156)     Initial Impression / Assessment and Plan / ED Course  I have reviewed the triage vital signs and the nursing notes.  Pertinent labs & imaging results that were available during my care of the patient were reviewed by me and considered in my medical decision making (see chart for details).  Clinical Course    GIRLIE AUEN is a 55 y.o. female with a past medical history significant for mitral valve prolapse and remote history of migraines who presents with neck pain, headache, and shortness of breath. History and exam seen above.  Given patient's history of migraines, suspect this may be return of migrainous headache. However, given recent neck manipulation with massage, vertebral artery dissection considered and CT scans ordered to evaluate. Patient also had shortness of breath with exertion prompting workup of heart and lungs.   Will be given headache cocktail to try to improve headache during her workup.   No focal  neurologic deficits on initial exam.  CT head and neck showed no evidence of vertebral artery dissection or stroke. Urinalysis showed no evidence of acute infection. D diver negative. Potassium slightly decreased, this was supplemented orally. Troponin negative.  Patient had mild leukocytosis of 13.2. This was felt to be secondary to her steroid use. Her lack nuchal rigidity, neck pain, fever, or chills, do not feel patient has meningitis.  Patient was reassessed after her imaging in the headache cocktail nearly resolved her headache. Patient felt appropriate for discharged with outpatient follow-up plans with her PCP and possibly neurology. Patient had no other questions, concerns, or complaints and the patient was discharged in good condition with significant improvement in presenting headache.   Final Clinical Impressions(s) / ED Diagnoses   Final diagnoses:  Neck pain  Acute nonintractable headache, unspecified headache type    New Prescriptions New Prescriptions   No medications on file    Clinical Impression: 1. Acute nonintractable headache, unspecified headache type   2. Neck pain     Disposition: Discharge  Condition: Good  I have discussed the results, Dx and Tx plan with the pt(& family if present). He/she/they expressed understanding and agree(s) with the plan. Discharge instructions discussed at great length. Strict return precautions discussed and pt &/or family have verbalized understanding of the instructions. No further questions at time of discharge.    Discharge Medication List as of 07/13/2016  9:50 PM      Follow Up: Biagio Borg, MD [place] [address] [phone]  Schedule an appointment as soon as possible for a visit  If symptoms worsen, please return to the nearest ED.     Gwenyth Allegra Nikalas Bramel, MD 07/14/16 0300

## 2016-07-16 DIAGNOSIS — M5412 Radiculopathy, cervical region: Secondary | ICD-10-CM | POA: Diagnosis not present

## 2016-07-16 DIAGNOSIS — M5021 Other cervical disc displacement,  high cervical region: Secondary | ICD-10-CM | POA: Diagnosis not present

## 2016-07-16 DIAGNOSIS — M47812 Spondylosis without myelopathy or radiculopathy, cervical region: Secondary | ICD-10-CM | POA: Diagnosis not present

## 2016-07-16 DIAGNOSIS — M4802 Spinal stenosis, cervical region: Secondary | ICD-10-CM | POA: Diagnosis not present

## 2016-07-16 DIAGNOSIS — M542 Cervicalgia: Secondary | ICD-10-CM | POA: Diagnosis not present

## 2016-07-19 ENCOUNTER — Other Ambulatory Visit (HOSPITAL_COMMUNITY): Payer: Self-pay | Admitting: Neurosurgery

## 2016-07-19 DIAGNOSIS — M5412 Radiculopathy, cervical region: Secondary | ICD-10-CM

## 2016-07-25 ENCOUNTER — Ambulatory Visit (HOSPITAL_COMMUNITY)
Admission: RE | Admit: 2016-07-25 | Discharge: 2016-07-25 | Disposition: A | Discharge status: Home / Self Care | Payer: 59 | Source: Ambulatory Visit | Attending: Neurosurgery | Admitting: Neurosurgery

## 2016-07-25 DIAGNOSIS — M50122 Cervical disc disorder at C5-C6 level with radiculopathy: Secondary | ICD-10-CM | POA: Insufficient documentation

## 2016-07-25 DIAGNOSIS — M4722 Other spondylosis with radiculopathy, cervical region: Secondary | ICD-10-CM | POA: Diagnosis not present

## 2016-07-25 DIAGNOSIS — M542 Cervicalgia: Secondary | ICD-10-CM | POA: Diagnosis not present

## 2016-07-25 DIAGNOSIS — M5412 Radiculopathy, cervical region: Secondary | ICD-10-CM

## 2016-07-25 DIAGNOSIS — M4802 Spinal stenosis, cervical region: Secondary | ICD-10-CM | POA: Diagnosis not present

## 2016-07-25 DIAGNOSIS — M50221 Other cervical disc displacement at C4-C5 level: Secondary | ICD-10-CM | POA: Diagnosis not present

## 2016-07-25 DIAGNOSIS — M5021 Other cervical disc displacement,  high cervical region: Secondary | ICD-10-CM | POA: Diagnosis not present

## 2016-08-06 ENCOUNTER — Ambulatory Visit: Payer: 59 | Attending: Internal Medicine

## 2016-08-06 DIAGNOSIS — M5412 Radiculopathy, cervical region: Secondary | ICD-10-CM | POA: Insufficient documentation

## 2016-08-06 DIAGNOSIS — M542 Cervicalgia: Secondary | ICD-10-CM | POA: Insufficient documentation

## 2016-08-06 DIAGNOSIS — R252 Cramp and spasm: Secondary | ICD-10-CM | POA: Diagnosis not present

## 2016-08-06 DIAGNOSIS — R293 Abnormal posture: Secondary | ICD-10-CM | POA: Insufficient documentation

## 2016-08-06 DIAGNOSIS — M4722 Other spondylosis with radiculopathy, cervical region: Secondary | ICD-10-CM | POA: Diagnosis not present

## 2016-08-06 NOTE — Therapy (Signed)
[place], [place], [address] Phone: [phone]   Fax:  [phone]  Physical Therapy Evaluation  Patient Details  Name: Jillian Andrade MRN: [id number] Date of Birth: March 21, 1961 Referring Provider: Erline Levine, MD   Encounter Date: 08/06/2016      PT End of Session - 08/06/16 1009    Visit Number 1   Number of Visits 12   Date for PT Re-Evaluation 09/17/16   Authorization Type [facility]   PT Start Time 0930   PT Stop Time 1008   PT Time Calculation (min) 38 min   Activity Tolerance Patient tolerated treatment well;No increased pain   Behavior During Therapy WFL for tasks assessed/performed      Past Medical History:  Diagnosis Date  . Complication of anesthesia    n/v  . Fibroids in ovaries    pain in left side  . Insomnia 05/01/2016  . Lumbar disc disease 05/01/2016  . MVP (mitral valve prolapse) mild, no cardiologist  . No pertinent past medical history   . PONV (postoperative nausea and vomiting)     Past Surgical History:  Procedure Laterality Date  . ABDOMINAL HYSTERECTOMY    . ANKLE ARTHROSCOPY  10/31/2011   Procedure: ANKLE ARTHROSCOPY;  Surgeon: Jillian Rhein, MD;  Location: [facility];  Service: Orthopedics;  Laterality: Right;  RIGHT ANKLE ARTHROSCOPY WITH EXTENSIVE DEBRIDEMENT  . COLONOSCOPY    . COLONOSCOPY N/A 03/02/2014   Procedure: COLONOSCOPY;  Surgeon: Jillian Nipper, MD;  [facility];  Service: Endoscopy;  Laterality: N/A;  ultra slim scope  . DILATION AND CURETTAGE OF UTERUS    . HEMORRHOID SURGERY      There were no vitals filed for this visit.       Subjective Assessment - 08/06/16 0940    Subjective She reports onset of pain in lower back  then she began to have neck pain and burning and tingling started a few weeks ago but pain in neck started w monts ago.    Limitations --  Sleeping, she is doing all normal activity sometimes with  pain   How long can you sit comfortably? varies   How long can you stand comfortably? AS needed   How long can you walk comfortably? As needed   Diagnostic tests MRI: Bone spurs Multi level Degenerativ changes and stenosis   Patient Stated Goals To get some releif and not have surgery   Currently in Pain? Yes   Pain Score 5    Pain Location Neck   Pain Orientation Posterior;Left   Pain Descriptors / Indicators Burning;Tingling  needles back of head   Pain Type Chronic pain   Pain Radiating Towards lateral arm to elbows and tingle into RT hand   Pain Onset More than a month ago   Pain Frequency Intermittent   Aggravating Factors  sleep    Pain Relieving Factors massage and ice , occasionally  meds   Multiple Pain Sites No            [facility] PT Assessment - 08/06/16 0001      Assessment   Medical Diagnosis cervical radiculopathy   Referring Provider Jillian Levine, MD    Onset Date/Surgical Date --  a couple months ago   Next MD Visit 3 weeks   Prior Therapy No     Precautions   Precautions None     Restrictions   Weight Bearing Restrictions No  Balance Screen   Has the patient fallen in the past 6 months No   Has the patient had a decrease in activity level because of a fear of falling?  No   Is the patient reluctant to leave their home because of a fear of falling?  No     Cognition   Overall Cognitive Status Within Functional Limits for tasks assessed     Posture/Postural Control   Posture Comments slight incr flex knees an forward lead standing     ROM / Strength   AROM / PROM / Strength AROM;Strength     AROM   AROM Assessment Site Cervical   Cervical Flexion 48   Cervical Extension 50   Cervical - Right Side Bend 45   Cervical - Left Side Bend 30   Cervical - Right Rotation 70   Cervical - Left Rotation 57     Strength   Overall Strength Comments UE WNL     Palpation   Palpation comment MAnual traction did not increase pain. Tender  Lt lower  cervical and scalenes .                            PT Education - 08/06/16 1008    Education provided Yes   Education Details POC , HEP, posture   Person(s) Educated Patient   Methods Explanation;Demonstration;Verbal cues;Handout;Tactile cues   Comprehension Returned demonstration;Verbalized understanding          PT Short Term Goals - 08/06/16 1013      PT SHORT TERM GOAL #1   Title Independent with inital HEP   Time 3   Status New     PT SHORT TERM GOAL #2   Title She will report 30% or more dcreased in arm symptoms   Time 3   Period Weeks   Status New     PT SHORT TERM GOAL #3   Title Increase LT cervical rotation to 60+ degrees     Time 3   Period Weeks   Status New           PT Long Term Goals - 08/06/16 1014      PT LONG TERM GOAL #1   Title She will be independnet with all HEP issued   Time 6   Period Weeks   Status New     PT LONG TERM GOAL #2   Title She will have 70 degrees cervical rotation for improve fuciton. when looking over left shoulder   Time 6   Period Weeks   Status New     PT LONG TERM GOAL #3   Title She will demo understanding of good sitting posture   Time 6   Period Weeks   Status New     PT LONG TERM GOAL #4   Title She will report pain eased 50% or more for home and work tasks   Time 6   Period Weeks   Status New     PT LONG TERM GOAL #5   Title She will report sleep improved 50% or more due to dec pain   Time 6   Period Weeks   Status New               Plan - 08/06/16 1009    Clinical Impression Statement Ms Juliane Andrade preesents for low complexity eval with neck pain and pain extending in to LT upper arm and tingleing intermittant to LT hand  (  on MRI show multi level degenerative changes) , She has normal strength and mild forward head posture . She ahoul do bette rwith PT but may be limited due to significant changes to spine   Rehab Potential Good   PT Frequency 2x / week   PT Duration  6 weeks   PT Treatment/Interventions Cryotherapy;Traction;Ultrasound;Passive range of motion;Patient/family education;Manual techniques;Taping;Therapeutic exercise;Dry needling   PT Next Visit Plan Manual , traction manual or mechanical , modalities   PT Home Exercise Plan postureal exercises   Consulted and Agree with Plan of Care Patient      Patient will benefit from skilled therapeutic intervention in order to improve the following deficits and impairments:  Postural dysfunction, Pain, Decreased range of motion, Increased muscle spasms, Decreased activity tolerance  Visit Diagnosis: Radiculopathy, cervical region - Plan: PT plan of care cert/re-cert  Cramp and spasm - Plan: PT plan of care cert/re-cert  Abnormal posture - Plan: PT plan of care cert/re-cert  Cervicalgia - Plan: PT plan of care cert/re-cert     Problem List Patient Active Problem List   Diagnosis Date Noted  . Cervical pain (neck) 07/10/2016  . Lumbar disc disease 05/01/2016  . Lower back pain 05/01/2016  . Insomnia 05/01/2016  . Encounter for well adult exam with abnormal findings 04/12/2015  . Chest pain 04/12/2015  . Hemoptysis 04/12/2015  . Fibroids     Darrel Hoover PT 08/06/2016, 11:53 AM  [facility] [address] [place], [place], [address] Phone: [phone]   Fax:  [phone]  Name: Jillian Andrade MRN: [id number] Date of Birth: [birth year]

## 2016-08-06 NOTE — Patient Instructions (Signed)
From cabinet info on posture and supporting spine for posture, cervical retraction and scapula retraction with depression many  X/day 1-5 reps 5-10 sec hold asked not to exaggerate

## 2016-08-13 ENCOUNTER — Ambulatory Visit: Payer: 59

## 2016-08-13 DIAGNOSIS — R293 Abnormal posture: Secondary | ICD-10-CM | POA: Diagnosis not present

## 2016-08-13 DIAGNOSIS — M542 Cervicalgia: Secondary | ICD-10-CM

## 2016-08-13 DIAGNOSIS — R252 Cramp and spasm: Secondary | ICD-10-CM

## 2016-08-13 DIAGNOSIS — M4722 Other spondylosis with radiculopathy, cervical region: Secondary | ICD-10-CM | POA: Diagnosis not present

## 2016-08-13 DIAGNOSIS — M5412 Radiculopathy, cervical region: Secondary | ICD-10-CM

## 2016-08-13 NOTE — Therapy (Signed)
[place], [place], [address] Phone: [phone]   Fax:  [phone]  Physical Therapy Treatment  Patient Details  Name: Jillian Andrade MRN: [id number] Date of Birth: [birth year] Referring Provider: Erline Levine, MD   Encounter Date: 08/13/2016      PT End of Session - 08/13/16 1138    Visit Number 2   Number of Visits 12   Date for PT Re-Evaluation 09/17/16   Authorization Type [facility]   PT Start Time 1100   PT Stop Time 1152   PT Time Calculation (min) 52 min   Activity Tolerance Patient tolerated treatment well;No increased pain   Behavior During Therapy WFL for tasks assessed/performed      Past Medical History:  Diagnosis Date  . Complication of anesthesia    n/v  . Fibroids in ovaries    pain in left side  . Insomnia 05/01/2016  . Lumbar disc disease 05/01/2016  . MVP (mitral valve prolapse) mild, no cardiologist  . No pertinent past medical history   . PONV (postoperative nausea and vomiting)     Past Surgical History:  Procedure Laterality Date  . ABDOMINAL HYSTERECTOMY    . ANKLE ARTHROSCOPY  10/31/2011   Procedure: ANKLE ARTHROSCOPY;  Surgeon: Colin Rhein, MD;  Location: [facility];  Service: Orthopedics;  Laterality: Right;  RIGHT ANKLE ARTHROSCOPY WITH EXTENSIVE DEBRIDEMENT  . COLONOSCOPY    . COLONOSCOPY N/A 03/02/2014   Procedure: COLONOSCOPY;  Surgeon: Cleotis Nipper, MD;  Location: [facility];  Service: Endoscopy;  Laterality: N/A;  ultra slim scope  . DILATION AND CURETTAGE OF UTERUS    . HEMORRHOID SURGERY      There were no vitals filed for this visit.      Subjective Assessment - 08/13/16 1109    Subjective She reports pain varies day to day. depends on what I do    Currently in Pain? Yes   Pain Score 4    Pain Location Neck   Pain Orientation Left;Posterior   Pain Descriptors / Indicators Burning;Tingling   Pain Type Chronic pain   Pain  Radiating Towards Heaviness in Lt shoulder, parasthesias on/off LT hand   Pain Onset More than a month ago   Pain Frequency Intermittent   Aggravating Factors  sleep   Pain Relieving Factors Massage , ice   Multiple Pain Sites No                         [facility] Adult PT Treatment/Exercise - 08/13/16 1111      Exercises   Exercises Neck     Neck Exercises: Seated   Postural Training Reviewed scapula and cervical retraction  with minor tactile cues.     Modalities   Modalities Moist Heat;Traction;Ultrasound     Moist Heat Therapy   Number Minutes Moist Heat 12 Minutes   Moist Heat Location Cervical  LT and shoulder     Ultrasound   Ultrasound Location Lt neck   Ultrasound Parameters !00//% 1MHz 1.5 Wcm2   Ultrasound Goals Pain     Manual Therapy   Manual Therapy Joint mobilization;Soft tissue mobilization   Joint Mobilization Gentle PA glides C2 to T1    Soft tissue mobilization STW to LT andterior lateral and psoterior nec and traps. with and without tool                  PT Short Term Goals -  08/13/16 1139      PT SHORT TERM GOAL #1   Title Independent with inital HEP   Status On-going     PT SHORT TERM GOAL #2   Title She will report 30% or more dcreased in arm symptoms   Status On-going     PT SHORT TERM GOAL #3   Title Increase LT cervical rotation to 60+ degrees     Status Unable to assess           PT Long Term Goals - 08/06/16 1014      PT LONG TERM GOAL #1   Title She will be independnet with all HEP issued   Time 6   Period Weeks   Status New     PT LONG TERM GOAL #2   Title She will have 70 degrees cervical rotation for improve fuciton. when looking over left shoulder   Time 6   Period Weeks   Status New     PT LONG TERM GOAL #3   Title She will demo understanding of good sitting posture   Time 6   Period Weeks   Status New     PT LONG TERM GOAL #4   Title She will report pain eased 50% or more for home and  work tasks   Time 6   Period Weeks   Status New     PT LONG TERM GOAL #5   Title She will report sleep improved 50% or more due to dec pain   Time 6   Period Weeks   Status New               Plan - 08/13/16 1138    Clinical Impression Statement She reports no change since eval. She felt session eased pain . Need to add some band /stab exercises for neck next visit   PT Treatment/Interventions Cryotherapy;Traction;Ultrasound;Passive range of motion;Patient/family education;Manual techniques;Taping;Therapeutic exercise;Dry needling   PT Next Visit Plan Manual , traction manual or mechanical , modalities  band exercises for home   PT Home Exercise Plan postural exercises   Consulted and Agree with Plan of Care Patient      Patient will benefit from skilled therapeutic intervention in order to improve the following deficits and impairments:  Postural dysfunction, Pain, Decreased range of motion, Increased muscle spasms, Decreased activity tolerance  Visit Diagnosis: Radiculopathy, cervical region  Cramp and spasm  Abnormal posture  Cervicalgia     Problem List Patient Active Problem List   Diagnosis Date Noted  . Cervical pain (neck) 07/10/2016  . Lumbar disc disease 05/01/2016  . Lower back pain 05/01/2016  . Insomnia 05/01/2016  . Encounter for well adult exam with abnormal findings 04/12/2015  . Chest pain 04/12/2015  . Hemoptysis 04/12/2015  . Fibroids     Darrel Hoover  PT 08/13/2016, 11:41 AM  [facility] [address] [place], [place], [address] Phone: [phone]   Fax:  [phone]  Name: BRANDAN SOCKWELL MRN: [id number] Date of Birth: Feb 15, 1961

## 2016-08-15 ENCOUNTER — Ambulatory Visit: Payer: 59 | Admitting: Physical Therapy

## 2016-08-20 ENCOUNTER — Ambulatory Visit: Payer: 59 | Attending: Internal Medicine

## 2016-08-20 DIAGNOSIS — R252 Cramp and spasm: Secondary | ICD-10-CM | POA: Diagnosis not present

## 2016-08-20 DIAGNOSIS — M542 Cervicalgia: Secondary | ICD-10-CM | POA: Diagnosis not present

## 2016-08-20 DIAGNOSIS — R293 Abnormal posture: Secondary | ICD-10-CM | POA: Diagnosis not present

## 2016-08-20 DIAGNOSIS — M5412 Radiculopathy, cervical region: Secondary | ICD-10-CM | POA: Diagnosis not present

## 2016-08-20 NOTE — Therapy (Signed)
[place], [place], [address] Phone: [phone]   Fax:  [phone]  Physical Therapy Treatment  Patient Details  Name: Jillian Andrade MRN: [id number] Date of Birth: [birth year] Referring Provider: Erline Levine, MD   Encounter Date: 08/20/2016      PT End of Session - 08/20/16 0929    Visit Number 3   Number of Visits 12   Date for PT Re-Evaluation 09/17/16   Authorization Type [facility]   PT Start Time 0930   PT Stop Time 9030   PT Time Calculation (min) 53 min   Activity Tolerance Patient tolerated treatment well;No increased pain   Behavior During Therapy WFL for tasks assessed/performed      Past Medical History:  Diagnosis Date  . Complication of anesthesia    n/v  . Fibroids in ovaries    pain in left side  . Insomnia 05/01/2016  . Lumbar disc disease 05/01/2016  . MVP (mitral valve prolapse) mild, no cardiologist  . No pertinent past medical history   . PONV (postoperative nausea and vomiting)     Past Surgical History:  Procedure Laterality Date  . ABDOMINAL HYSTERECTOMY    . ANKLE ARTHROSCOPY  10/31/2011   Procedure: ANKLE ARTHROSCOPY;  Surgeon: Colin Rhein, MD;  Location: [facility];  Service: Orthopedics;  Laterality: Right;  RIGHT ANKLE ARTHROSCOPY WITH EXTENSIVE DEBRIDEMENT  . COLONOSCOPY    . COLONOSCOPY N/A 03/02/2014   Procedure: COLONOSCOPY;  Surgeon: Cleotis Nipper, MD;  [facility];  Service: Endoscopy;  Laterality: N/A;  ultra slim scope  . DILATION AND CURETTAGE OF UTERUS    . HEMORRHOID SURGERY      There were no vitals filed for this visit.      Subjective Assessment - 08/20/16 0930    Subjective She report pain comes and goes nad hard to position to sleep. No hand symptoms today   Currently in Pain? Yes   Pain Score 4    Pain Location Neck   Pain Orientation Left;Posterior   Pain Descriptors / Indicators Burning;Tightness   Pain  Type Chronic pain   Pain Onset More than a month ago   Pain Frequency Intermittent   Aggravating Factors  position to sleep   Pain Relieving Factors massage , ice/    Multiple Pain Sites No                         [facility] Adult PT Treatment/Exercise - 08/20/16 0001      Neck Exercises: Seated   Postural Training Stabilization with good neck posture x 10-12 reps ER /hor abduction/flexion , minor tactile cues for neck postion     Moist Heat Therapy   Number Minutes Moist Heat 12 Minutes   Moist Heat Location Cervical     Ultrasound   Ultrasound Location LT neck   Ultrasound Parameters 100% 1 MHz 1.5 Wcm2   Ultrasound Goals Pain     Manual Therapy   Joint Mobilization Gentle PA glides C2 to T1    Soft tissue mobilization STW to LT andterior lateral and psoterior nec and traps. with and without tool     Neck Exercises: Stretches   Upper Trapezius Stretch 20 seconds;2 reps   Levator Stretch 2 reps;20 seconds                  PT Short Term Goals - 08/20/16 1014  PT SHORT TERM GOAL #1   Title Independent with inital HEP   Status Achieved     PT SHORT TERM GOAL #2   Title She will report 30% or more dcreased in arm symptoms   Baseline 15%   Status Partially Met     PT SHORT TERM GOAL #3   Title Increase LT cervical rotation to 60+ degrees     Baseline need to measure next visit   Status On-going           PT Long Term Goals - 08/06/16 1014      PT LONG TERM GOAL #1   Title She will be independnet with all HEP issued   Time 6   Period Weeks   Status New     PT LONG TERM GOAL #2   Title She will have 70 degrees cervical rotation for improve fuciton. when looking over left shoulder   Time 6   Period Weeks   Status New     PT LONG TERM GOAL #3   Title She will demo understanding of good sitting posture   Time 6   Period Weeks   Status New     PT LONG TERM GOAL #4   Title She will report pain eased 50% or more for home and work  tasks   Time 6   Period Weeks   Status New     PT LONG TERM GOAL #5   Title She will report sleep improved 50% or more due to dec pain   Time 6   Period Weeks   Status New               Plan - 08/20/16 0930    Clinical Impression Statement She is able to correct posture and did well with stabilization exercises. Symptoms are intermittant though trying to sleep is no better.    PT Treatment/Interventions Cryotherapy;Traction;Ultrasound;Passive range of motion;Patient/family education;Manual techniques;Taping;Therapeutic exercise;Dry needling   PT Next Visit Plan Manual , traction manual or mechanical , modalities  band exercises for home.   Measure ROM next   PT Home Exercise Plan postural exercises, red band stabilization exercises   Consulted and Agree with Plan of Care Patient      Patient will benefit from skilled therapeutic intervention in order to improve the following deficits and impairments:  Postural dysfunction, Pain, Decreased range of motion, Increased muscle spasms, Decreased activity tolerance  Visit Diagnosis: Radiculopathy, cervical region  Cramp and spasm  Abnormal posture  Cervicalgia     Problem List Patient Active Problem List   Diagnosis Date Noted  . Cervical pain (neck) 07/10/2016  . Lumbar disc disease 05/01/2016  . Lower back pain 05/01/2016  . Insomnia 05/01/2016  . Encounter for well adult exam with abnormal findings 04/12/2015  . Chest pain 04/12/2015  . Hemoptysis 04/12/2015  . Fibroids     Darrel Hoover  PT 08/20/2016, 10:16 AM  [facility] [address] [place], [place], [address] Phone: [phone]   Fax:  [phone]  Name: Jillian Andrade MRN: [id number] Date of Birth: 1961/04/30

## 2016-08-22 ENCOUNTER — Ambulatory Visit: Payer: 59

## 2016-08-22 DIAGNOSIS — R293 Abnormal posture: Secondary | ICD-10-CM

## 2016-08-22 DIAGNOSIS — R252 Cramp and spasm: Secondary | ICD-10-CM | POA: Diagnosis not present

## 2016-08-22 DIAGNOSIS — M5412 Radiculopathy, cervical region: Secondary | ICD-10-CM

## 2016-08-22 DIAGNOSIS — M542 Cervicalgia: Secondary | ICD-10-CM

## 2016-08-22 NOTE — Therapy (Signed)
[place], [place], [address] Phone: [phone]   Fax:  [phone]  Physical Therapy Treatment  Patient Details  Name: Jillian Andrade MRN: [id number] Date of Birth: 17-Oct-1960 Referring Provider: Erline Levine, MD   Encounter Date: 08/22/2016      PT End of Session - 08/22/16 1014    Visit Number 4   Number of Visits 12   Date for PT Re-Evaluation 09/17/16   Authorization Type [facility]   PT Start Time 0931   PT Stop Time 1028   PT Time Calculation (min) 57 min   Activity Tolerance Patient tolerated treatment well;No increased pain   Behavior During Therapy WFL for tasks assessed/performed      Past Medical History:  Diagnosis Date  . Complication of anesthesia    n/v  . Fibroids in ovaries    pain in left side  . Insomnia 05/01/2016  . Lumbar disc disease 05/01/2016  . MVP (mitral valve prolapse) mild, no cardiologist  . No pertinent past medical history   . PONV (postoperative nausea and vomiting)     Past Surgical History:  Procedure Laterality Date  . ABDOMINAL HYSTERECTOMY    . ANKLE ARTHROSCOPY  10/31/2011   Procedure: ANKLE ARTHROSCOPY;  Surgeon: Colin Rhein, MD;  Location: [facility];  Service: Orthopedics;  Laterality: Right;  RIGHT ANKLE ARTHROSCOPY WITH EXTENSIVE DEBRIDEMENT  . COLONOSCOPY    . COLONOSCOPY N/A 03/02/2014   Procedure: COLONOSCOPY;  Surgeon: Cleotis Nipper, MD;  [facility];  Service: Endoscopy;  Laterality: N/A;  ultra slim scope  . DILATION AND CURETTAGE OF UTERUS    . HEMORRHOID SURGERY      There were no vitals filed for this visit.      Subjective Assessment - 08/22/16 0935    Subjective Pain ok this AM. Did HEP yesterday.    Currently in Pain? Yes   Pain Score 3    Pain Location Neck   Pain Orientation Left;Posterior   Pain Descriptors / Indicators Burning;Tingling   Pain Type Chronic pain;Other (Comment)   Pain  Radiating Towards feels heavy LT upper arm   Pain Onset More than a month ago   Pain Frequency Intermittent                         [facility] Adult PT Treatment/Exercise - 08/22/16 0001      Moist Heat Therapy   Number Minutes Moist Heat 15 Minutes   Moist Heat Location Cervical  LT     Ultrasound   Ultrasound Location neck   Ultrasound Goals Pain     Manual Therapy   Manual therapy comments Mobs with movement   Joint Mobilization Gentle PA glides C2 to T1    Soft tissue mobilization STW to LT andterior lateral and psoterior nec and traps. with and without tool     Neck Exercises: Stretches   Upper Trapezius Stretch 1 rep;20 seconds   Levator Stretch 1 rep;20 seconds                  PT Short Term Goals - 08/22/16 0940      PT SHORT TERM GOAL #1   Title Independent with inital HEP   Status Achieved     PT SHORT TERM GOAL #2   Title She will report 30% or more dcreased in arm symptoms   Baseline 20%   Status Partially Met  PT SHORT TERM GOAL #3   Title Increase LT cervical rotation to 60+ degrees     Baseline 65 degrees to RT   55 degrees LT   Status Partially Met           PT Long Term Goals - 08/06/16 1014      PT LONG TERM GOAL #1   Title She will be independnet with all HEP issued   Time 6   Period Weeks   Status New     PT LONG TERM GOAL #2   Title She will have 70 degrees cervical rotation for improve fuciton. when looking over left shoulder   Time 6   Period Weeks   Status New     PT LONG TERM GOAL #3   Title She will demo understanding of good sitting posture   Time 6   Period Weeks   Status New     PT LONG TERM GOAL #4   Title She will report pain eased 50% or more for home and work tasks   Time 6   Period Weeks   Status New     PT LONG TERM GOAL #5   Title She will report sleep improved 50% or more due to dec pain   Time 6   Period Weeks   Status New               Plan - 08/22/16 1015     Clinical Impression Statement Slow improvement. About same as last visit. PROM Lt  rotation 70 degrees but pain/pressure LT shoulder.    PT Treatment/Interventions Cryotherapy;Traction;Ultrasound;Passive range of motion;Patient/family education;Manual techniques;Taping;Therapeutic exercise;Dry needling   PT Next Visit Plan Manual , traction manual or mechanical , modalitie      PT Home Exercise Plan postural exercises, red band stabilization exercises   Consulted and Agree with Plan of Care Patient      Patient will benefit from skilled therapeutic intervention in order to improve the following deficits and impairments:  Postural dysfunction, Pain, Decreased range of motion, Increased muscle spasms, Decreased activity tolerance  Visit Diagnosis: Radiculopathy, cervical region  Cramp and spasm  Abnormal posture  Cervicalgia     Problem List Patient Active Problem List   Diagnosis Date Noted  . Cervical pain (neck) 07/10/2016  . Lumbar disc disease 05/01/2016  . Lower back pain 05/01/2016  . Insomnia 05/01/2016  . Encounter for well adult exam with abnormal findings 04/12/2015  . Chest pain 04/12/2015  . Hemoptysis 04/12/2015  . Fibroids     Darrel Hoover  PT 08/22/2016, 10:17 AM  [facility] [address] [place], [place], [address] Phone: [phone]   Fax:  [phone]  Name: Jillian Andrade MRN: [id number] Date of Birth: 06-Jan-1961

## 2016-08-27 ENCOUNTER — Ambulatory Visit: Payer: 59 | Admitting: Physical Therapy

## 2016-08-27 DIAGNOSIS — R293 Abnormal posture: Secondary | ICD-10-CM | POA: Diagnosis not present

## 2016-08-27 DIAGNOSIS — M542 Cervicalgia: Secondary | ICD-10-CM

## 2016-08-27 DIAGNOSIS — M5412 Radiculopathy, cervical region: Secondary | ICD-10-CM | POA: Diagnosis not present

## 2016-08-27 DIAGNOSIS — R252 Cramp and spasm: Secondary | ICD-10-CM | POA: Diagnosis not present

## 2016-08-27 NOTE — Therapy (Signed)
[place], [place], [address] Phone: [phone]   Fax:  [phone]  Physical Therapy Treatment  Patient Details  Name: Jillian Andrade MRN: [id number] Date of Birth: 09-30-1961 Referring Provider: Erline Levine, MD   Encounter Date: 08/27/2016      PT End of Session - 08/27/16 1314    Visit Number 5   Number of Visits 12   Date for PT Re-Evaluation 09/17/16   PT Start Time 0933   PT Stop Time 1023   PT Time Calculation (min) 50 min   Activity Tolerance Patient tolerated treatment well   Behavior During Therapy [facility] for tasks assessed/performed      Past Medical History:  Diagnosis Date  . Complication of anesthesia    n/v  . Fibroids in ovaries    pain in left side  . Insomnia 05/01/2016  . Lumbar disc disease 05/01/2016  . MVP (mitral valve prolapse) mild, no cardiologist  . No pertinent past medical history   . PONV (postoperative nausea and vomiting)     Past Surgical History:  Procedure Laterality Date  . ABDOMINAL HYSTERECTOMY    . ANKLE ARTHROSCOPY  10/31/2011   Procedure: ANKLE ARTHROSCOPY;  Surgeon: Colin Rhein, MD;  Location: [place];  Service: Orthopedics;  Laterality: Right;  RIGHT ANKLE ARTHROSCOPY WITH EXTENSIVE DEBRIDEMENT  . COLONOSCOPY    . COLONOSCOPY N/A 03/02/2014   Procedure: COLONOSCOPY;  Surgeon: Cleotis Nipper, MD;  [facility];  Service: Endoscopy;  Laterality: N/A;  ultra slim scope  . DILATION AND CURETTAGE OF UTERUS    . HEMORRHOID SURGERY      There were no vitals filed for this visit.      Subjective Assessment - 08/27/16 0939    Subjective I can't get comfortable at night,  tingling keep me awake. PT has helped because I am able to know the exercises.     Currently in Pain? Yes   Pain Location Neck   Pain Orientation Posterior;Left   Pain Descriptors / Indicators Burning;Tingling   Pain Type Chronic pain   Pain Radiating  Towards shoulder and arm pain intermittant   Pain Frequency Constant   Aggravating Factors  chin to chest,  turning head to left,  side bend,  staying in one position too long.   moving patients in the OR.  transporting   Pain Relieving Factors pillows,  moving and stretching a little more,     Multiple Pain Sites No            [facility] PT Assessment - 08/27/16 0001      Strength   Overall Strength --  grip RT: 30,25,19 =  24.66  Left:  20,15,13=  16                     [facility] Adult PT Treatment/Exercise - 08/27/16 0001      Traction   Type of Traction Cervical   Min (lbs) 5   Max (lbs) 12   Hold Time protocol   Rest Time protocol   Time 17 minutes,      Manual Therapy   Manual Therapy Soft tissue mobilization   Manual therapy comments iNSTRUMENT ASSIST UPPER ARM NECK PERI SCAPULAR.  tINGLING INCREASED WITH soft tissue work                   PT Short Term Goals - 08/27/16 1318      PT  SHORT TERM GOAL #1   Title Independent with inital HEP   Time 3   Period Weeks   Status Achieved     PT SHORT TERM GOAL #2   Title She will report 30% or more dcreased in arm symptoms   Time 3   Status Partially Met     PT SHORT TERM GOAL #3   Title Increase LT cervical rotation to 60+ degrees     Time 3   Period Weeks   Status Unable to assess           PT Long Term Goals - 08/06/16 1014      PT LONG TERM GOAL #1   Title She will be independnet with all HEP issued   Time 6   Period Weeks   Status New     PT LONG TERM GOAL #2   Title She will have 70 degrees cervical rotation for improve fuciton. when looking over left shoulder   Time 6   Period Weeks   Status New     PT LONG TERM GOAL #3   Title She will demo understanding of good sitting posture   Time 6   Period Weeks   Status New     PT LONG TERM GOAL #4   Title She will report pain eased 50% or more for home and work tasks   Time 6   Period Weeks   Status New     PT LONG TERM  GOAL #5   Title She will report sleep improved 50% or more due to dec pain   Time 6   Period Weeks   Status New               Plan - 08/27/16 1315    Clinical Impression Statement Grip average LT 16 LBS,  Right average= 24.66 LBS.  Trial of Mechanical traction today.     PT Next Visit Plan Manual ,Assess traction  mechanical , modalities, review stabilization exercises,  Give posture handout.   PT Home Exercise Plan postural exercises, red band stabilization exercises   Consulted and Agree with Plan of Care Patient      Patient will benefit from skilled therapeutic intervention in order to improve the following deficits and impairments:  Postural dysfunction, Pain, Decreased range of motion, Increased muscle spasms, Decreased activity tolerance  Visit Diagnosis: Radiculopathy, cervical region  Cramp and spasm  Abnormal posture  Cervicalgia     Problem List Patient Active Problem List   Diagnosis Date Noted  . Cervical pain (neck) 07/10/2016  . Lumbar disc disease 05/01/2016  . Lower back pain 05/01/2016  . Insomnia 05/01/2016  . Encounter for well adult exam with abnormal findings 04/12/2015  . Chest pain 04/12/2015  . Hemoptysis 04/12/2015  . Fibroids     HARRIS,KAREN PTA 08/27/2016, 1:20 PM  [facility] [address] [place], [place], [address] Phone: [phone]   Fax:  [phone]  Name: Jillian Andrade MRN: [id number] Date of Birth: 1961/06/20

## 2016-08-29 ENCOUNTER — Ambulatory Visit: Payer: 59 | Admitting: Physical Therapy

## 2016-08-29 DIAGNOSIS — R252 Cramp and spasm: Secondary | ICD-10-CM

## 2016-08-29 DIAGNOSIS — M542 Cervicalgia: Secondary | ICD-10-CM | POA: Diagnosis not present

## 2016-08-29 DIAGNOSIS — M5412 Radiculopathy, cervical region: Secondary | ICD-10-CM

## 2016-08-29 DIAGNOSIS — R293 Abnormal posture: Secondary | ICD-10-CM

## 2016-08-29 NOTE — Therapy (Signed)
[place], [place], [address] Phone: [phone]   Fax:  [phone]  Physical Therapy Treatment  Patient Details  Name: STEHANIE Andrade MRN: [id number] Date of Birth: [birth year] Referring Provider: Erline Levine, MD   Encounter Date: 08/29/2016      PT End of Session - 08/29/16 0950    Visit Number 6   Number of Visits 12   Date for PT Re-Evaluation 09/17/16   PT Start Time 0851   PT Stop Time 0953   PT Time Calculation (min) 62 min   Activity Tolerance Patient tolerated treatment well   Behavior During Therapy [facility] for tasks assessed/performed      Past Medical History:  Diagnosis Date  . Complication of anesthesia    n/v  . Fibroids in ovaries    pain in left side  . Insomnia 05/01/2016  . Lumbar disc disease 05/01/2016  . MVP (mitral valve prolapse) mild, no cardiologist  . No pertinent past medical history   . PONV (postoperative nausea and vomiting)     Past Surgical History:  Procedure Laterality Date  . ABDOMINAL HYSTERECTOMY    . ANKLE ARTHROSCOPY  10/31/2011   Procedure: ANKLE ARTHROSCOPY;  Surgeon: Colin Rhein, MD;  Location: [place];  Service: Orthopedics;  Laterality: Right;  RIGHT ANKLE ARTHROSCOPY WITH EXTENSIVE DEBRIDEMENT  . COLONOSCOPY    . COLONOSCOPY N/A 03/02/2014   Procedure: COLONOSCOPY;  Surgeon: Cleotis Nipper, MD;  [facility];  Service: Endoscopy;  Laterality: N/A;  ultra slim scope  . DILATION AND CURETTAGE OF UTERUS    . HEMORRHOID SURGERY      There were no vitals filed for this visit.      Subjective Assessment - 08/29/16 0858    Subjective Patient has reayy started a habit of doing her home exercises during the day.  3/10 now   traction helpful with neck movement.   Currently in Pain? Yes   Pain Score 3   up to 6/10   Pain Location Neck   Pain Orientation Posterior;Left   Pain Descriptors / Indicators  Burning;Tingling;Numbness;Heaviness   Pain Type Chronic pain   Pain Radiating Towards shouldr arm , finger tips are numb   Pain Frequency Constant   Aggravating Factors  lifting, turning head   Pain Relieving Factors pillows, ice pack            [facility] PT Assessment - 08/29/16 0001      AROM   Overall AROM  --  supine shoulder flexion 127 in supine, AROM   Cervical - Right Rotation 68   Cervical - Left Rotation 49                     [facility] Adult PT Treatment/Exercise - 08/29/16 0001      Self-Care   Self-Care --  ADL habndout review,  some modifications demo     Neck Exercises: Supine   Neck Retraction 5 reps   Neck Retraction Limitations with circles,  horizontal add/abd,  flexion extension, flexion 10 X each  heaviness proximal arm noted bt patient   Shoulder ABduction 5 reps  yellow band   Other Supine Exercise ER yellow band 10 X cues for technique     Hand Exercises for Cervical Radiculopathy   Other Hand Exercise for Cervical Radiculopathy Nerve floss     Traction   Type of Traction Cervical   Min (lbs) 0   Max (lbs)  14   Hold Time protocol   Rest Time protocol   Time 17 minutes     Manual Therapy   Manual therapy comments Declined     Neck Exercises: Stretches   Other Neck Stretches Nerve floss in supine 5 X 2 sets for median nerve, AA hand                PT Education - 08/29/16 0950    Education provided Yes   Education Details Posture ADL   Person(s) Educated Patient   Methods Explanation;Demonstration;Handout   Comprehension Verbalized understanding;Returned demonstration          PT Short Term Goals - 08/27/16 1318      PT SHORT TERM GOAL #1   Title Independent with inital HEP   Time 3   Period Weeks   Status Achieved     PT SHORT TERM GOAL #2   Title She will report 30% or more dcreased in arm symptoms   Time 3   Status Partially Met     PT SHORT TERM GOAL #3   Title Increase LT cervical rotation to 60+  degrees     Time 3   Period Weeks   Status Unable to assess           PT Long Term Goals - 08/06/16 1014      PT LONG TERM GOAL #1   Title She will be independnet with all HEP issued   Time 6   Period Weeks   Status New     PT LONG TERM GOAL #2   Title She will have 70 degrees cervical rotation for improve fuciton. when looking over left shoulder   Time 6   Period Weeks   Status New     PT LONG TERM GOAL #3   Title She will demo understanding of good sitting posture   Time 6   Period Weeks   Status New     PT LONG TERM GOAL #4   Title She will report pain eased 50% or more for home and work tasks   Time 6   Period Weeks   Status New     PT LONG TERM GOAL #5   Title She will report sleep improved 50% or more due to dec pain   Time 6   Period Weeks   Status New               Plan - 08/29/16 0952    Clinical Impression Statement Rotation right 68, left 49 cervical spine.  Education, exercise focus today.  Less pain with cervical rotation noted by patient.  Patient notes she has an episode at work (since her last visit.)where it was hard to breath and she almost passed out, she was taken to the ER.     PT Next Visit Plan ,Assess  increased weight on traction  mechanical , modalities, review stabilization exercises,  Answer any posture questions.   PT Home Exercise Plan postural exercises, red band stabilization exercises   Consulted and Agree with Plan of Care Patient      Patient will benefit from skilled therapeutic intervention in order to improve the following deficits and impairments:  Postural dysfunction, Pain, Decreased range of motion, Increased muscle spasms, Decreased activity tolerance  Visit Diagnosis: Radiculopathy, cervical region  Cramp and spasm  Abnormal posture  Cervicalgia     Problem List Patient Active Problem List   Diagnosis Date Noted  . Cervical pain (neck) 07/10/2016  .  Lumbar disc disease 05/01/2016  . Lower back  pain 05/01/2016  . Insomnia 05/01/2016  . Encounter for well adult exam with abnormal findings 04/12/2015  . Chest pain 04/12/2015  . Hemoptysis 04/12/2015  . Fibroids     Kalista Laguardia PTA 08/29/2016, 10:00 AM  [facility] [address] [place], [place], [address] Phone: [phone]   Fax:  [phone]  Name: Jillian Andrade MRN: [id number] Date of Birth: August 02, 1961

## 2016-08-29 NOTE — Patient Instructions (Signed)

## 2016-09-03 ENCOUNTER — Ambulatory Visit: Payer: 59

## 2016-09-03 DIAGNOSIS — M5412 Radiculopathy, cervical region: Secondary | ICD-10-CM

## 2016-09-03 DIAGNOSIS — R293 Abnormal posture: Secondary | ICD-10-CM

## 2016-09-03 DIAGNOSIS — M542 Cervicalgia: Secondary | ICD-10-CM

## 2016-09-03 DIAGNOSIS — R252 Cramp and spasm: Secondary | ICD-10-CM

## 2016-09-03 NOTE — Therapy (Addendum)
[place], [place], [address] Phone: [phone]   Fax:  [phone]  Patient Details  Name: Jillian Andrade MRN: [id number] Date of Birth: [birth year] Referring Provider:  Biagio Borg, MD  Encounter Date: 09/03/2016    Discharge Visit         [name] reports she has regressed and is now really no better so she wanted her to follow up with Dr Vertell Limber in 2 weeks and see what he thinks. We will put her on hold until post MD visit and decide on continued PT  after that visit.  Darrel Hoover  PT 09/03/2016, 12:08 PM  [facility] [address] [place], [place], [address] Phone: [phone]   Fax:  [phone]                                                       PHYSICAL THERAPY DISCHARGE SUMMARY  Visits from Start of Care: 7  Current functional level related to goals / functional outcomes: She had surgery after this visit   Remaining deficits: She has surgery after this visit   Education / Equipment: HEP Plan: Patient agrees to discharge.  Patient goals were not met. Patient is being discharged due to a change in medical status.  ?????    [name] Fiana Gladu  PT   10/16/16    10:45 AM

## 2016-09-05 ENCOUNTER — Ambulatory Visit: Payer: 59

## 2016-09-19 DIAGNOSIS — Z6833 Body mass index (BMI) 33.0-33.9, adult: Secondary | ICD-10-CM | POA: Diagnosis not present

## 2016-09-19 DIAGNOSIS — M5000 Cervical disc disorder with myelopathy, unspecified cervical region: Secondary | ICD-10-CM | POA: Diagnosis not present

## 2016-09-19 DIAGNOSIS — I10 Essential (primary) hypertension: Secondary | ICD-10-CM | POA: Diagnosis not present

## 2016-09-19 DIAGNOSIS — R030 Elevated blood-pressure reading, without diagnosis of hypertension: Secondary | ICD-10-CM | POA: Diagnosis not present

## 2016-09-24 ENCOUNTER — Other Ambulatory Visit: Payer: Self-pay | Admitting: Neurosurgery

## 2016-10-01 NOTE — Pre-Procedure Instructions (Signed)
Jillian Andrade  10/01/2016      [place], [place] - [address] [facility]. [address] [place] [address] Phone: [phone] Fax: [phone]    Your procedure is scheduled on December 21  Report to [facility] [place].M.  Call this number if you have problems the morning of surgery:  [phone]   Remember:  Do not eat food or drink liquids after midnight.   Take these medicines the morning of surgery with A SIP OF WATER cyclobenzaprine (FLEXERIL if needed  7 days prior to surgery STOP taking any Aspirin, Aleve, Naproxen, Ibuprofen, Motrin, Advil, Goody's, BC's, all herbal medications, fish oil, and all vitamins    Do not wear jewelry, make-up or nail polish.  Do not wear lotions, powders, or perfumes, or deoderant.  Do not shave 48 hours prior to surgery.    Do not bring valuables to the hospital.  [facility] is not responsible for any belongings or valuables.  Contacts, dentures or bridgework may not be worn into surgery.  Leave your suitcase in the car.  After surgery it may be brought to your room.  For patients admitted to the hospital, discharge time will be determined by your treatment team.  Patients discharged the day of surgery will not be allowed to drive home.    Special instructions:   [place]- Preparing For Surgery  Before surgery, you can play an important role. Because skin is not sterile, your skin needs to be as free of germs as possible. You can reduce the number of germs on your skin by washing with CHG (chlorahexidine gluconate) Soap before surgery.  CHG is an antiseptic cleaner which kills germs and bonds with the skin to continue killing germs even after washing.  Please do not use if you have an allergy to CHG or antibacterial soaps. If your skin becomes reddened/irritated stop using the CHG.  Do not shave (including legs and underarms) for at least 48 hours prior to  first CHG shower. It is OK to shave your face.  Please follow these instructions carefully.   1. Shower the NIGHT BEFORE SURGERY and the MORNING OF SURGERY with CHG.   2. If you chose to wash your hair, wash your hair first as usual with your normal shampoo.  3. After you shampoo, rinse your hair and body thoroughly to remove the shampoo.  4. Use CHG as you would any other liquid soap. You can apply CHG directly to the skin and wash gently with a scrungie or a clean washcloth.   5. Apply the CHG Soap to your body ONLY FROM THE NECK DOWN.  Do not use on open wounds or open sores. Avoid contact with your eyes, ears, mouth and genitals (private parts). Wash genitals (private parts) with your normal soap.  6. Wash thoroughly, paying special attention to the area where your surgery will be performed.  7. Thoroughly rinse your body with warm water from the neck down.  8. DO NOT shower/wash with your normal soap after using and rinsing off the CHG Soap.  9. Pat yourself dry with a CLEAN TOWEL.   10. Wear CLEAN PAJAMAS   11. Place CLEAN SHEETS on your bed the night of your first shower and DO NOT SLEEP WITH PETS.    Day of Surgery: Do not apply any deodorants/lotions. Please wear clean clothes to the hospital/surgery center.      Please read over  the following fact sheets that you were given.

## 2016-10-02 ENCOUNTER — Encounter (HOSPITAL_COMMUNITY)
Admission: RE | Admit: 2016-10-02 | Discharge: 2016-10-02 | Disposition: A | Discharge status: Home / Self Care | Payer: 59 | Source: Ambulatory Visit | Attending: Neurosurgery | Admitting: Neurosurgery

## 2016-10-02 ENCOUNTER — Encounter (HOSPITAL_COMMUNITY): Payer: Self-pay

## 2016-10-02 DIAGNOSIS — Z90710 Acquired absence of both cervix and uterus: Secondary | ICD-10-CM | POA: Diagnosis not present

## 2016-10-02 DIAGNOSIS — M4722 Other spondylosis with radiculopathy, cervical region: Secondary | ICD-10-CM | POA: Diagnosis not present

## 2016-10-02 DIAGNOSIS — M50122 Cervical disc disorder at C5-C6 level with radiculopathy: Secondary | ICD-10-CM | POA: Diagnosis not present

## 2016-10-02 DIAGNOSIS — M4602 Spinal enthesopathy, cervical region: Secondary | ICD-10-CM | POA: Diagnosis not present

## 2016-10-02 DIAGNOSIS — Z823 Family history of stroke: Secondary | ICD-10-CM | POA: Diagnosis not present

## 2016-10-02 DIAGNOSIS — M50022 Cervical disc disorder at C5-C6 level with myelopathy: Secondary | ICD-10-CM | POA: Diagnosis not present

## 2016-10-02 DIAGNOSIS — I10 Essential (primary) hypertension: Secondary | ICD-10-CM | POA: Diagnosis not present

## 2016-10-02 DIAGNOSIS — M4802 Spinal stenosis, cervical region: Secondary | ICD-10-CM | POA: Diagnosis not present

## 2016-10-02 HISTORY — DX: Gastro-esophageal reflux disease without esophagitis: K21.9

## 2016-10-02 HISTORY — DX: Cardiac murmur, unspecified: R01.1

## 2016-10-02 LAB — TYPE AND SCREEN
ABO/RH(D): O POS
Antibody Screen: NEGATIVE

## 2016-10-02 LAB — CBC
HCT: 38.9 % (ref 36.0–46.0)
Hemoglobin: 12.8 g/dL (ref 12.0–15.0)
MCH: 29.6 pg (ref 26.0–34.0)
MCHC: 32.9 g/dL (ref 30.0–36.0)
MCV: 90 fL (ref 78.0–100.0)
Platelets: 159 10*3/uL (ref 150–400)
RBC: 4.32 MIL/uL (ref 3.87–5.11)
RDW: 13.4 % (ref 11.5–15.5)
WBC: 6.6 10*3/uL (ref 4.0–10.5)

## 2016-10-02 LAB — BASIC METABOLIC PANEL
ANION GAP: 8 (ref 5–15)
BUN: 12 mg/dL (ref 6–20)
CHLORIDE: 110 mmol/L (ref 101–111)
CO2: 23 mmol/L (ref 22–32)
CREATININE: 0.96 mg/dL (ref 0.44–1.00)
Calcium: 9.4 mg/dL (ref 8.9–10.3)
GFR calc non Af Amer: >60 mL/min (ref >60)
Glucose, Bld: 110 mg/dL — ABNORMAL HIGH (ref 65–99)
POTASSIUM: 4.4 mmol/L (ref 3.5–5.1)
SODIUM: 141 mmol/L (ref 135–145)

## 2016-10-02 LAB — SURGICAL PCR SCREEN
MRSA, PCR: NEGATIVE
Staphylococcus aureus: NEGATIVE

## 2016-10-02 LAB — ABO/RH: ABO/RH(D): O POS

## 2016-10-02 NOTE — Progress Notes (Signed)
PCP - Biagio Borg Cardiologist - sees Dr. Tamala Julian, There is a clearance note in the shadow chart for cardiac clearance from Dr. Casimer Lanius  Chest x-ray - not eneded EKG - 07/13/16 Stress Test - 04/27/15 ECHO - denies Cardiac Cath - denies  Will send to anesthesia for review of EKG     Patient denies shortness of breath, fever, cough and chest pain at PAT appointment

## 2016-10-03 NOTE — Progress Notes (Signed)
Anesthesia Chart Review:  Pt is a 55 year old female scheduled for C5-6 ACDF on 10/04/2016 with Erline Levine, MD.   PMH includes:  MVP, post-op N/V, GERD. Never smoker. BMI 34  Medications reviewed.  Preoperative labs reviewed.    CXR 07/13/16: No acute cardiopulmonary abnormality.  EKG 07/13/16: sinus rhythm. Consider RAE. ST elevation, probable normal early repol pattern. Compared to EKG 04/09/15, T waves in III now upright.   Nuclear stress test 04/27/15:   The left ventricular ejection fraction is hyperdynamic (>65%).  Nuclear stress EF: 66%.  There was no ST segment deviation noted during stress.  The study is normal.  This is a low risk study.  Threre is no ischemia identified.  If no changes, I anticipate pt can proceed with surgery as scheduled.   Willeen Cass, FNP-BC [facility] Short Stay Surgical Center/Anesthesiology Phone: [phone] 10/03/2016 10:34 AM

## 2016-10-04 ENCOUNTER — Ambulatory Visit (HOSPITAL_COMMUNITY): Payer: 59 | Admitting: Anesthesiology

## 2016-10-04 ENCOUNTER — Inpatient Hospital Stay (HOSPITAL_COMMUNITY)
Admission: AD | Admit: 2016-10-04 | Discharge: 2016-10-05 | DRG: 473 | Disposition: A | Discharge status: Home / Self Care | Payer: 59 | Source: Ambulatory Visit | Attending: Neurosurgery | Admitting: Neurosurgery

## 2016-10-04 ENCOUNTER — Ambulatory Visit (HOSPITAL_COMMUNITY): Payer: 59

## 2016-10-04 ENCOUNTER — Encounter (HOSPITAL_COMMUNITY): Payer: Self-pay | Admitting: Urology

## 2016-10-04 ENCOUNTER — Encounter (HOSPITAL_COMMUNITY)
Admission: AD | Disposition: A | Discharge status: Home / Self Care | Payer: Self-pay | Source: Ambulatory Visit | Attending: Neurosurgery

## 2016-10-04 DIAGNOSIS — I10 Essential (primary) hypertension: Secondary | ICD-10-CM | POA: Diagnosis present

## 2016-10-04 DIAGNOSIS — M4602 Spinal enthesopathy, cervical region: Secondary | ICD-10-CM | POA: Diagnosis present

## 2016-10-04 DIAGNOSIS — Z419 Encounter for procedure for purposes other than remedying health state, unspecified: Secondary | ICD-10-CM

## 2016-10-04 DIAGNOSIS — M4722 Other spondylosis with radiculopathy, cervical region: Secondary | ICD-10-CM | POA: Diagnosis present

## 2016-10-04 DIAGNOSIS — M50022 Cervical disc disorder at C5-C6 level with myelopathy: Secondary | ICD-10-CM | POA: Diagnosis not present

## 2016-10-04 DIAGNOSIS — Z90710 Acquired absence of both cervix and uterus: Secondary | ICD-10-CM

## 2016-10-04 DIAGNOSIS — M4802 Spinal stenosis, cervical region: Secondary | ICD-10-CM | POA: Diagnosis not present

## 2016-10-04 DIAGNOSIS — M542 Cervicalgia: Secondary | ICD-10-CM | POA: Diagnosis present

## 2016-10-04 DIAGNOSIS — D259 Leiomyoma of uterus, unspecified: Secondary | ICD-10-CM | POA: Diagnosis not present

## 2016-10-04 DIAGNOSIS — Z823 Family history of stroke: Secondary | ICD-10-CM | POA: Diagnosis not present

## 2016-10-04 DIAGNOSIS — M5020 Other cervical disc displacement, unspecified cervical region: Secondary | ICD-10-CM | POA: Diagnosis present

## 2016-10-04 DIAGNOSIS — M50122 Cervical disc disorder at C5-C6 level with radiculopathy: Secondary | ICD-10-CM | POA: Diagnosis not present

## 2016-10-04 DIAGNOSIS — Z981 Arthrodesis status: Secondary | ICD-10-CM | POA: Diagnosis not present

## 2016-10-04 HISTORY — PX: ANTERIOR CERVICAL DECOMP/DISCECTOMY FUSION: SHX1161

## 2016-10-04 SURGERY — ANTERIOR CERVICAL DECOMPRESSION/DISCECTOMY FUSION 1 LEVEL
Anesthesia: General | Site: Spine Cervical

## 2016-10-04 MED ORDER — HEMOSTATIC AGENTS (NO CHARGE) OPTIME
TOPICAL | Status: DC | PRN
  Administered 2016-10-04: 1 via TOPICAL

## 2016-10-04 MED ORDER — BUPIVACAINE HCL (PF) 0.5 % IJ SOLN
INTRAMUSCULAR | Status: AC
  Filled 2016-10-04: qty 30

## 2016-10-04 MED ORDER — THROMBIN 5000 UNITS EX SOLR
OROMUCOSAL | Status: DC | PRN
  Administered 2016-10-04: 09:00:00 via TOPICAL

## 2016-10-04 MED ORDER — LACTATED RINGERS IV SOLN
INTRAVENOUS | Status: DC | PRN
  Administered 2016-10-04: 07:00:00 via INTRAVENOUS

## 2016-10-04 MED ORDER — THROMBIN 5000 UNITS EX SOLR
CUTANEOUS | Status: DC | PRN
  Administered 2016-10-04 (×2): 5000 [IU] via TOPICAL

## 2016-10-04 MED ORDER — DIPHENHYDRAMINE HCL 25 MG PO CAPS: 25.0000 mg | ORAL_CAPSULE | Freq: Four times a day (QID) | ORAL | Status: DC | PRN

## 2016-10-04 MED ORDER — LIDOCAINE-EPINEPHRINE (PF) 2 %-1:200000 IJ SOLN
INTRAMUSCULAR | Status: AC
  Filled 2016-10-04: qty 20

## 2016-10-04 MED ORDER — ONDANSETRON HCL 4 MG/2ML IJ SOLN
INTRAMUSCULAR | Status: AC
  Filled 2016-10-04: qty 2

## 2016-10-04 MED ORDER — THROMBIN 5000 UNITS EX SOLR
CUTANEOUS | Status: AC
  Filled 2016-10-04: qty 5000

## 2016-10-04 MED ORDER — MIDAZOLAM HCL 5 MG/5ML IJ SOLN
INTRAMUSCULAR | Status: DC | PRN
  Administered 2016-10-04: 2 mg via INTRAVENOUS

## 2016-10-04 MED ORDER — ZOLPIDEM TARTRATE 5 MG PO TABS: 5.0000 mg | ORAL_TABLET | Freq: Every evening | ORAL | Status: DC | PRN

## 2016-10-04 MED ORDER — CEFAZOLIN SODIUM-DEXTROSE 2-4 GM/100ML-% IV SOLN
2.0000 g | INTRAVENOUS | Status: AC
  Administered 2016-10-04: 2 g via INTRAVENOUS

## 2016-10-04 MED ORDER — CEFAZOLIN IN D5W 1 GM/50ML IV SOLN
1.0000 g | Freq: Three times a day (TID) | INTRAVENOUS | Status: AC
  Administered 2016-10-04 (×2): 1 g via INTRAVENOUS
  Filled 2016-10-04 (×2): qty 50

## 2016-10-04 MED ORDER — ACETAMINOPHEN 650 MG RE SUPP: 650.0000 mg | RECTAL | Status: DC | PRN

## 2016-10-04 MED ORDER — SCOPOLAMINE 1 MG/3DAYS TD PT72
MEDICATED_PATCH | TRANSDERMAL | Status: AC
  Filled 2016-10-04: qty 1

## 2016-10-04 MED ORDER — ROCURONIUM BROMIDE 10 MG/ML (PF) SYRINGE
PREFILLED_SYRINGE | INTRAVENOUS | Status: DC | PRN
  Administered 2016-10-04: 50 mg via INTRAVENOUS

## 2016-10-04 MED ORDER — FENTANYL CITRATE (PF) 100 MCG/2ML IJ SOLN
INTRAMUSCULAR | Status: AC
  Filled 2016-10-04: qty 2

## 2016-10-04 MED ORDER — SENNOSIDES-DOCUSATE SODIUM 8.6-50 MG PO TABS: 1.0000 | ORAL_TABLET | Freq: Every evening | ORAL | Status: DC | PRN

## 2016-10-04 MED ORDER — ALIVE WOMENS ENERGY PO TABS: 1.0000 | ORAL_TABLET | Freq: Every day | ORAL | Status: DC

## 2016-10-04 MED ORDER — CHLORHEXIDINE GLUCONATE CLOTH 2 % EX PADS: 6.0000 | MEDICATED_PAD | Freq: Once | CUTANEOUS | Status: DC

## 2016-10-04 MED ORDER — LIDOCAINE 2% (20 MG/ML) 5 ML SYRINGE
INTRAMUSCULAR | Status: AC
  Filled 2016-10-04: qty 10

## 2016-10-04 MED ORDER — LIDOCAINE 2% (20 MG/ML) 5 ML SYRINGE
INTRAMUSCULAR | Status: DC | PRN
  Administered 2016-10-04 (×2): 60 mg via INTRAVENOUS

## 2016-10-04 MED ORDER — DOCUSATE SODIUM 100 MG PO CAPS
100.0000 mg | ORAL_CAPSULE | Freq: Two times a day (BID) | ORAL | Status: DC
  Administered 2016-10-04: 100 mg via ORAL
  Filled 2016-10-04: qty 1

## 2016-10-04 MED ORDER — PHENOL 1.4 % MT LIQD: 1.0000 | OROMUCOSAL | Status: DC | PRN

## 2016-10-04 MED ORDER — PROPOFOL 10 MG/ML IV BOLUS
INTRAVENOUS | Status: DC | PRN
  Administered 2016-10-04: 150 mg via INTRAVENOUS

## 2016-10-04 MED ORDER — FENTANYL CITRATE (PF) 100 MCG/2ML IJ SOLN
INTRAMUSCULAR | Status: DC | PRN
  Administered 2016-10-04 (×4): 50 ug via INTRAVENOUS

## 2016-10-04 MED ORDER — MENTHOL 3 MG MT LOZG: 1.0000 | LOZENGE | OROMUCOSAL | Status: DC | PRN

## 2016-10-04 MED ORDER — LIDOCAINE-EPINEPHRINE 2 %-1:100000 IJ SOLN
INTRAMUSCULAR | Status: DC | PRN
  Administered 2016-10-04: 5 mL via INTRADERMAL

## 2016-10-04 MED ORDER — ONDANSETRON HCL 4 MG/2ML IJ SOLN: 4.0000 mg | Freq: Once | INTRAMUSCULAR | Status: DC | PRN

## 2016-10-04 MED ORDER — METHOCARBAMOL 500 MG PO TABS
500.0000 mg | ORAL_TABLET | Freq: Four times a day (QID) | ORAL | Status: DC | PRN
  Administered 2016-10-04: 500 mg via ORAL
  Filled 2016-10-04: qty 1

## 2016-10-04 MED ORDER — ROCURONIUM BROMIDE 50 MG/5ML IV SOSY
PREFILLED_SYRINGE | INTRAVENOUS | Status: AC
  Filled 2016-10-04: qty 5

## 2016-10-04 MED ORDER — HYDROMORPHONE HCL 1 MG/ML IJ SOLN: 0.5000 mg | INTRAMUSCULAR | Status: DC | PRN

## 2016-10-04 MED ORDER — FLEET ENEMA 7-19 GM/118ML RE ENEM: 1.0000 | ENEMA | Freq: Once | RECTAL | Status: DC | PRN

## 2016-10-04 MED ORDER — ONDANSETRON HCL 4 MG/2ML IJ SOLN
INTRAMUSCULAR | Status: DC | PRN
  Administered 2016-10-04: 4 mg via INTRAVENOUS

## 2016-10-04 MED ORDER — 0.9 % SODIUM CHLORIDE (POUR BTL) OPTIME
TOPICAL | Status: DC | PRN
  Administered 2016-10-04: 1000 mL

## 2016-10-04 MED ORDER — SODIUM CHLORIDE 0.9% FLUSH: 3.0000 mL | INTRAVENOUS | Status: DC | PRN

## 2016-10-04 MED ORDER — PANTOPRAZOLE SODIUM 40 MG IV SOLR: 40.0000 mg | Freq: Every day | INTRAVENOUS | Status: DC

## 2016-10-04 MED ORDER — PANTOPRAZOLE SODIUM 40 MG PO TBEC
40.0000 mg | DELAYED_RELEASE_TABLET | Freq: Every day | ORAL | Status: DC
  Administered 2016-10-04: 40 mg via ORAL
  Filled 2016-10-04: qty 1

## 2016-10-04 MED ORDER — BUPIVACAINE HCL (PF) 0.5 % IJ SOLN
INTRAMUSCULAR | Status: DC | PRN
  Administered 2016-10-04: 5 mL

## 2016-10-04 MED ORDER — THROMBIN 5000 UNITS EX SOLR
CUTANEOUS | Status: AC
  Filled 2016-10-04: qty 10000

## 2016-10-04 MED ORDER — METHOCARBAMOL 1000 MG/10ML IJ SOLN: 500.0000 mg | Freq: Four times a day (QID) | INTRAVENOUS | Status: DC | PRN

## 2016-10-04 MED ORDER — HYDROCODONE-ACETAMINOPHEN 5-325 MG PO TABS: 1.0000 | ORAL_TABLET | ORAL | Status: DC | PRN

## 2016-10-04 MED ORDER — KCL IN DEXTROSE-NACL 20-5-0.45 MEQ/L-%-% IV SOLN
INTRAVENOUS | Status: DC
  Filled 2016-10-04: qty 1000

## 2016-10-04 MED ORDER — ADULT MULTIVITAMIN W/MINERALS CH: 1.0000 | ORAL_TABLET | Freq: Every day | ORAL | Status: DC

## 2016-10-04 MED ORDER — ONDANSETRON HCL 4 MG/2ML IJ SOLN
4.0000 mg | INTRAMUSCULAR | Status: DC | PRN
  Administered 2016-10-04: 4 mg via INTRAVENOUS
  Filled 2016-10-04: qty 2

## 2016-10-04 MED ORDER — BISACODYL 10 MG RE SUPP: 10.0000 mg | Freq: Every day | RECTAL | Status: DC | PRN

## 2016-10-04 MED ORDER — FENTANYL CITRATE (PF) 100 MCG/2ML IJ SOLN: 25.0000 ug | INTRAMUSCULAR | Status: DC | PRN

## 2016-10-04 MED ORDER — HYDROXYZINE HCL 50 MG/ML IM SOLN
50.0000 mg | INTRAMUSCULAR | Status: DC | PRN
  Administered 2016-10-04: 50 mg via INTRAMUSCULAR
  Filled 2016-10-04: qty 1

## 2016-10-04 MED ORDER — ACETAMINOPHEN 325 MG PO TABS: 650.0000 mg | ORAL_TABLET | ORAL | Status: DC | PRN

## 2016-10-04 MED ORDER — DEXAMETHASONE SODIUM PHOSPHATE 10 MG/ML IJ SOLN
INTRAMUSCULAR | Status: DC | PRN
  Administered 2016-10-04: 10 mg via INTRAVENOUS

## 2016-10-04 MED ORDER — DEXAMETHASONE SODIUM PHOSPHATE 10 MG/ML IJ SOLN
INTRAMUSCULAR | Status: AC
  Filled 2016-10-04: qty 1

## 2016-10-04 MED ORDER — SCOPOLAMINE 1 MG/3DAYS TD PT72
MEDICATED_PATCH | TRANSDERMAL | Status: DC | PRN
  Administered 2016-10-04: 1 via TRANSDERMAL

## 2016-10-04 MED ORDER — ALUM & MAG HYDROXIDE-SIMETH 200-200-20 MG/5ML PO SUSP: 30.0000 mL | Freq: Four times a day (QID) | ORAL | Status: DC | PRN

## 2016-10-04 MED ORDER — PROPOFOL 10 MG/ML IV BOLUS
INTRAVENOUS | Status: AC
  Filled 2016-10-04: qty 20

## 2016-10-04 MED ORDER — SUGAMMADEX SODIUM 200 MG/2ML IV SOLN
INTRAVENOUS | Status: DC | PRN
  Administered 2016-10-04: 200 mg via INTRAVENOUS

## 2016-10-04 MED ORDER — MIDAZOLAM HCL 2 MG/2ML IJ SOLN
INTRAMUSCULAR | Status: AC
  Filled 2016-10-04: qty 2

## 2016-10-04 MED ORDER — SUGAMMADEX SODIUM 200 MG/2ML IV SOLN
INTRAVENOUS | Status: AC
  Filled 2016-10-04: qty 2

## 2016-10-04 MED ORDER — SODIUM CHLORIDE 0.9% FLUSH: 3.0000 mL | Freq: Two times a day (BID) | INTRAVENOUS | Status: DC

## 2016-10-04 MED ORDER — TRAMADOL HCL 50 MG PO TABS
50.0000 mg | ORAL_TABLET | Freq: Four times a day (QID) | ORAL | Status: DC | PRN
  Administered 2016-10-04: 50 mg via ORAL
  Filled 2016-10-04: qty 1

## 2016-10-04 SURGICAL SUPPLY — 61 items
BASKET BONE COLLECTION (BASKET) ×2 IMPLANT
BIT DRILL 14X2.5XNS TI ANT (BIT) ×1 IMPLANT
BIT DRILL AVIATOR 14 (BIT) ×1
BIT DRILL NEURO 2X3.1 SFT TUCH (MISCELLANEOUS) ×1 IMPLANT
BIT DRL 14X2.5XNS TI ANT (BIT) ×1
BLADE ULTRA TIP 2M (BLADE) ×2 IMPLANT
BNDG GAUZE ELAST 4 BULKY (GAUZE/BANDAGES/DRESSINGS) ×4 IMPLANT
BUR BARREL STRAIGHT FLUTE 4.0 (BURR) ×2 IMPLANT
CANISTER SUCT 3000ML PPV (MISCELLANEOUS) ×2 IMPLANT
CARTRIDGE OIL MAESTRO DRILL (MISCELLANEOUS) ×1 IMPLANT
COVER MAYO STAND STRL (DRAPES) ×2 IMPLANT
DERMABOND ADVANCED (GAUZE/BANDAGES/DRESSINGS) ×1
DERMABOND ADVANCED .7 DNX12 (GAUZE/BANDAGES/DRESSINGS) ×1 IMPLANT
DIFFUSER DRILL AIR PNEUMATIC (MISCELLANEOUS) ×2 IMPLANT
DRAPE HALF SHEET 40X57 (DRAPES) ×2 IMPLANT
DRAPE LAPAROTOMY 100X72 PEDS (DRAPES) ×2 IMPLANT
DRAPE MICROSCOPE LEICA (MISCELLANEOUS) ×2 IMPLANT
DRAPE POUCH INSTRU U-SHP 10X18 (DRAPES) ×2 IMPLANT
DRILL NEURO 2X3.1 SOFT TOUCH (MISCELLANEOUS) ×2
DRSG OPSITE POSTOP 3X4 (GAUZE/BANDAGES/DRESSINGS) ×2 IMPLANT
DURAPREP 6ML APPLICATOR 50/CS (WOUND CARE) ×2 IMPLANT
ELECT COATED BLADE 2.86 ST (ELECTRODE) ×2 IMPLANT
ELECT REM PT RETURN 9FT ADLT (ELECTROSURGICAL) ×2
ELECTRODE REM PT RTRN 9FT ADLT (ELECTROSURGICAL) ×1 IMPLANT
GAUZE SPONGE 4X4 12PLY STRL (GAUZE/BANDAGES/DRESSINGS) IMPLANT
GAUZE SPONGE 4X4 16PLY XRAY LF (GAUZE/BANDAGES/DRESSINGS) IMPLANT
GLOVE BIO SURGEON STRL SZ8 (GLOVE) ×4 IMPLANT
GLOVE BIOGEL PI IND STRL 8 (GLOVE) ×2 IMPLANT
GLOVE BIOGEL PI IND STRL 8.5 (GLOVE) ×2 IMPLANT
GLOVE BIOGEL PI INDICATOR 8 (GLOVE) ×2
GLOVE BIOGEL PI INDICATOR 8.5 (GLOVE) ×2
GLOVE ECLIPSE 7.5 STRL STRAW (GLOVE) ×2 IMPLANT
GLOVE ECLIPSE 8.0 STRL XLNG CF (GLOVE) ×4 IMPLANT
GOWN STRL REUS W/ TWL LRG LVL3 (GOWN DISPOSABLE) IMPLANT
GOWN STRL REUS W/ TWL XL LVL3 (GOWN DISPOSABLE) ×1 IMPLANT
GOWN STRL REUS W/TWL 2XL LVL3 (GOWN DISPOSABLE) ×6 IMPLANT
GOWN STRL REUS W/TWL LRG LVL3 (GOWN DISPOSABLE)
GOWN STRL REUS W/TWL XL LVL3 (GOWN DISPOSABLE) ×1
HALTER HD/CHIN CERV TRACTION D (MISCELLANEOUS) ×2 IMPLANT
HEMOSTAT POWDER KIT SURGIFOAM (HEMOSTASIS) ×2 IMPLANT
KIT BASIN OR (CUSTOM PROCEDURE TRAY) ×2 IMPLANT
KIT ROOM TURNOVER OR (KITS) ×2 IMPLANT
NEEDLE HYPO 18GX1.5 BLUNT FILL (NEEDLE) IMPLANT
NEEDLE HYPO 25X1 1.5 SAFETY (NEEDLE) ×2 IMPLANT
NEEDLE SPNL 22GX3.5 QUINCKE BK (NEEDLE) ×2 IMPLANT
NS IRRIG 1000ML POUR BTL (IV SOLUTION) ×2 IMPLANT
OIL CARTRIDGE MAESTRO DRILL (MISCELLANEOUS) ×2
PACK LAMINECTOMY NEURO (CUSTOM PROCEDURE TRAY) ×2 IMPLANT
PAD ARMBOARD 7.5X6 YLW CONV (MISCELLANEOUS) ×6 IMPLANT
PEEK SPACER AVS AS 6X14X16 4D (Cage) ×2 IMPLANT
PIN DISTRACTION 14MM (PIN) ×4 IMPLANT
PLATE AVIATOR ASSY 1LVL SZ 14 (Plate) ×2 IMPLANT
RUBBERBAND STERILE (MISCELLANEOUS) ×4 IMPLANT
SCREW AVIATOR VAR SELFTAP 4X14 (Screw) ×8 IMPLANT
SPONGE INTESTINAL PEANUT (DISPOSABLE) ×2 IMPLANT
SPONGE SURGIFOAM ABS GEL SZ50 (HEMOSTASIS) ×2 IMPLANT
STAPLER SKIN PROX WIDE 3.9 (STAPLE) ×2 IMPLANT
SUT VIC AB 3-0 SH 8-18 (SUTURE) ×4 IMPLANT
TOWEL OR 17X24 6PK STRL BLUE (TOWEL DISPOSABLE) ×2 IMPLANT
TOWEL OR 17X26 10 PK STRL BLUE (TOWEL DISPOSABLE) ×2 IMPLANT
WATER STERILE IRR 1000ML POUR (IV SOLUTION) ×2 IMPLANT

## 2016-10-04 NOTE — Op Note (Signed)
10/04/2016  9:33 AM  PATIENT:  Colonel Bald  55 y.o. female  PRE-OPERATIVE DIAGNOSIS:  CERVICAL HERNIATED DISC WITH MYELOPATHY, C 56, stenosis, radiculopathy, cervicalgia  POST-OPERATIVE DIAGNOSIS:  CERVICAL HERNIATED DISC WITH MYELOPATHY, C 56, stenosis, radiculopathy, cervicalgia  PROCEDURE:  Procedure(s) with comments: CERVICAL FIVE-CERVICAL SIX  ANTERIOR CERVICAL DECOMPRESSION/DISCECTOMY/FUSION (N/A) - C5-6 ANTERIOR CERVICAL DECOMPRESSION/DISCECTOMY/FUSION with PEEK cage, autograft, plate  SURGEON:  Surgeon(s) and Role:    * Erline Levine, MD - Primary  PHYSICIAN ASSISTANT:   ASSISTANTS: [place], RN   ANESTHESIA:   general  EBL:  No intake/output data recorded.  BLOOD ADMINISTERED:none  DRAINS: none   LOCAL MEDICATIONS USED:  MARCAINE    and LIDOCAINE   SPECIMEN:  No Specimen  DISPOSITION OF SPECIMEN:  N/A  COUNTS:  YES  TOURNIQUET:  * No tourniquets in log *  DICTATION: Patient is 55 year old female with severe spondylosis, herniated disc, stenosis, radiculopathy C 56 level.  It was elected to take her to surgery for anterior cervical decompression and fusion C 56 level.  PROCEDURE: Patient was brought to operating room and following the smooth and uncomplicated induction of general endotracheal anesthesia her head was placed on a horseshoe head holder she was placed in 5 pounds of Holter traction and his anterior neck was prepped and draped in usual sterile fashion. An incision was made on the left side of midline after infiltrating the skin and subcutaneous tissues with local lidocaine. The platysmal layer was incised and subplatysmal dissection was performed exposing the anterior border sternocleidomastoid muscle. Using blunt dissection the carotid sheath was kept lateral and trachea and esophagus kept medial exposing the anterior cervical spine. A bent spinal needle was placed it was felt to be the C 56 level and this was poorly visualized due to body habitus, so  an additional needle was placed at the C 45 level and this was confirmed on intraoperative x-ray. Longus coli muscles were taken down from the anterior cervical spine using electrocautery and key elevator and self-retaining retractor was placed exposing the C 56 level. The interspace was incised and a thorough discectomy was performed. Distraction pins were placed. Uncinate spurs and central spondylitic ridges were drilled down with a high-speed drill. A large uncinate spur on the left was removed and the nerve root and dura were widely decompressed. The spinal cord dura and both C 6 nerve roots were widely decompressed. Hemostasis was assured. After trial sizing a 6 mm lordotic PEEK cage was selected and packed with local autograft. This was tamped into position and countersunk appropriately. Distraction weight was removed. A 14 mm Stryker Aviator anterior cervical plate was affixed to the cervical spine with 14 mm variable-angle screws 2 at C5, 2 at C6. All screws were well-positioned and locking mechanisms were engaged. A final X ray was obtained which showed well positioned upper aspect of the anterior plate without complicating features at the correct level. Soft tissues were inspected and found to be in good repair. The wound was irrigated. The platysma layer was closed with 3-0 Vicryl stitches and the skin was reapproximated with 3-0 Vicryl subcuticular stitches. The wound was dressed with Dermabond. Counts were correct at the end of the case. Patient was extubated and taken to recovery in stable and satisfactory condition.   PLAN OF CARE: Admit to inpatient   PATIENT DISPOSITION:  PACU - hemodynamically stable.   Delay start of Pharmacological VTE agent (>24hrs) due to surgical blood loss or risk of bleeding: yes

## 2016-10-04 NOTE — Progress Notes (Signed)
Patient is doing well with good bilateral upper extremity strength.  Dressing CDI.

## 2016-10-04 NOTE — Anesthesia Procedure Notes (Signed)
Procedure Name: Intubation Date/Time: 10/04/2016 7:42 AM Performed by: [name] Bright Pre-anesthesia Checklist: Patient identified, Emergency Drugs available, Suction available and Patient being monitored Patient Re-evaluated:Patient Re-evaluated prior to inductionOxygen Delivery Method: Circle system utilized Preoxygenation: Pre-oxygenation with 100% oxygen Intubation Type: IV induction Ventilation: Mask ventilation without difficulty Laryngoscope Size: Mac and 3 Grade View: Grade I Tube type: Oral Tube size: 7.0 mm Number of attempts: 1 Airway Equipment and Method: Stylet and LTA kit utilized Placement Confirmation: ETT inserted through vocal cords under direct vision,  positive ETCO2 and breath sounds checked- equal and bilateral Secured at: 21 cm Tube secured with: Tape Dental Injury: Teeth and Oropharynx as per pre-operative assessment

## 2016-10-04 NOTE — Transfer of Care (Signed)
Immediate Anesthesia Transfer of Care Note  Patient: Jillian Andrade  Procedure(s) Performed: Procedure(s) with comments: CERVICAL FIVE-CERVICAL SIX  ANTERIOR CERVICAL DECOMPRESSION/DISCECTOMY/FUSION (N/A) - C5-6 ANTERIOR CERVICAL DECOMPRESSION/DISCECTOMY/FUSION  Patient Location: PACU  Anesthesia Type:General  Level of Consciousness: sedated and responds to stimulation  Airway & Oxygen Therapy: Patient Spontanous Breathing and Patient connected to nasal cannula oxygen  Post-op Assessment: Report given to RN and Post -op Vital signs reviewed and stable  Post vital signs: Reviewed and stable  Last Vitals:  Vitals:   10/04/16 0555 10/04/16 0930  BP: 125/72   Pulse: 60   Resp: 18   Temp: 37 C (P) 36.4 C    Last Pain:  Vitals:   10/04/16 0555  TempSrc: Oral         Complications: No apparent anesthesia complications

## 2016-10-04 NOTE — Brief Op Note (Signed)
10/04/2016  9:33 AM  PATIENT:  Colonel Bald  55 y.o. female  PRE-OPERATIVE DIAGNOSIS:  CERVICAL HERNIATED DISC WITH MYELOPATHY, C 56, stenosis, radiculopathy, cervicalgia  POST-OPERATIVE DIAGNOSIS:  CERVICAL HERNIATED DISC WITH MYELOPATHY, C 56, stenosis, radiculopathy, cervicalgia  PROCEDURE:  Procedure(s) with comments: CERVICAL FIVE-CERVICAL SIX  ANTERIOR CERVICAL DECOMPRESSION/DISCECTOMY/FUSION (N/A) - C5-6 ANTERIOR CERVICAL DECOMPRESSION/DISCECTOMY/FUSION with PEEK cage, autograft, plate  SURGEON:  Surgeon(s) and Role:    * Erline Levine, MD - Primary  PHYSICIAN ASSISTANT:   ASSISTANTS: Poteat, RN   ANESTHESIA:   general  EBL:  No intake/output data recorded.  BLOOD ADMINISTERED:none  DRAINS: none   LOCAL MEDICATIONS USED:  MARCAINE    and LIDOCAINE   SPECIMEN:  No Specimen  DISPOSITION OF SPECIMEN:  N/A  COUNTS:  YES  TOURNIQUET:  * No tourniquets in log *  DICTATION: Patient is 55 year old female with severe spondylosis, herniated disc, stenosis, radiculopathy C 56 level.  It was elected to take her to surgery for anterior cervical decompression and fusion C 56 level.  PROCEDURE: Patient was brought to operating room and following the smooth and uncomplicated induction of general endotracheal anesthesia her head was placed on a horseshoe head holder she was placed in 5 pounds of Holter traction and his anterior neck was prepped and draped in usual sterile fashion. An incision was made on the left side of midline after infiltrating the skin and subcutaneous tissues with local lidocaine. The platysmal layer was incised and subplatysmal dissection was performed exposing the anterior border sternocleidomastoid muscle. Using blunt dissection the carotid sheath was kept lateral and trachea and esophagus kept medial exposing the anterior cervical spine. A bent spinal needle was placed it was felt to be the C 56 level and this was poorly visualized due to body habitus, so  an additional needle was placed at the C 45 level and this was confirmed on intraoperative x-ray. Longus coli muscles were taken down from the anterior cervical spine using electrocautery and key elevator and self-retaining retractor was placed exposing the C 56 level. The interspace was incised and a thorough discectomy was performed. Distraction pins were placed. Uncinate spurs and central spondylitic ridges were drilled down with a high-speed drill. A large uncinate spur on the left was removed and the nerve root and dura were widely decompressed. The spinal cord dura and both C 6 nerve roots were widely decompressed. Hemostasis was assured. After trial sizing a 6 mm lordotic PEEK cage was selected and packed with local autograft. This was tamped into position and countersunk appropriately. Distraction weight was removed. A 14 mm Stryker Aviator anterior cervical plate was affixed to the cervical spine with 14 mm variable-angle screws 2 at C5, 2 at C6. All screws were well-positioned and locking mechanisms were engaged. A final X ray was obtained which showed well positioned upper aspect of the anterior plate without complicating features at the correct level. Soft tissues were inspected and found to be in good repair. The wound was irrigated. The platysma layer was closed with 3-0 Vicryl stitches and the skin was reapproximated with 3-0 Vicryl subcuticular stitches. The wound was dressed with Dermabond. Counts were correct at the end of the case. Patient was extubated and taken to recovery in stable and satisfactory condition.   PLAN OF CARE: Admit to inpatient   PATIENT DISPOSITION:  PACU - hemodynamically stable.   Delay start of Pharmacological VTE agent (>24hrs) due to surgical blood loss or risk of bleeding: yes

## 2016-10-04 NOTE — Anesthesia Postprocedure Evaluation (Signed)
Anesthesia Post Note  Patient: Jillian Andrade  Procedure(s) Performed: Procedure(s) (LRB): CERVICAL FIVE-CERVICAL SIX  ANTERIOR CERVICAL DECOMPRESSION/DISCECTOMY/FUSION (N/A)  Patient location during evaluation: PACU Anesthesia Type: General Level of consciousness: awake, awake and alert and oriented Pain management: pain level controlled Vital Signs Assessment: post-procedure vital signs reviewed and stable Respiratory status: nonlabored ventilation, respiratory function stable and spontaneous breathing Cardiovascular status: blood pressure returned to baseline Anesthetic complications: no       Last Vitals:  Vitals:   10/04/16 1100 10/04/16 1133  BP: (!) 154/83 (!) 142/77  Pulse: (!) 58 60  Resp: 15 16  Temp: 36.8 C 36.4 C    Last Pain:  Vitals:   10/04/16 1100  TempSrc:   PainSc: 2                  Bethzy Hauck COKER

## 2016-10-04 NOTE — Anesthesia Preprocedure Evaluation (Addendum)

## 2016-10-04 NOTE — Progress Notes (Signed)
Awake, alert, conversant.  MAEW with good strength.  Doing well. 

## 2016-10-04 NOTE — H&P (Signed)
Patient ID:   [id number] Patient: Jillian Andrade  Date of Birth: [birth year] Visit Type: Office Visit   Date: 09/19/2016 04:00 PM Provider: Marchia Meiers. Vertell Limber MD   This 55 year old female presents for neck pain.  History of Present Illness: 1.  neck pain    The patient returns, stating that therapy offered no relief and she would like to proceed with surgery. Her neck and left arm pain continues.   Physical Exam: Left biceps weakness persists, positive Spurlings on the left      Medical/Surgical/Interim History Reviewed, no change.  Last detailed document date:07/16/2016.   Family History: Reviewed, no changes.  Last detailed document: 07/16/2016.   Social History: Tobacco use reviewed. Reviewed, no changes. Last detailed document date: 07/16/2016.      MEDICATIONS(added, continued or stopped this visit): Started Medication Directions Instruction Stopped   cyclobenzaprine 5 mg tablet take 1 tablet by oral route 3 times every day     prednisone take as directed       ALLERGIES: Ingredient Reaction Medication Name Comment  ACETAMINOPHEN Unknown PERCOCET   PROPOXYPHENE HCL Nausea, vomiting Wygesic   OXYCODONE HCL Unknown PERCOCET       Vitals Date Temp F BP Pulse Ht In Wt Lb BMI BSA Pain Score  09/19/2016  123/80 66 64 196 33.64  4/10      IMPRESSION The patient states that therapy was not helpful and she would like to proceed with the C5-6 ACDF that we discussed on her last visit. She continues to have left biceps weakness and a positive Spurlings on the left.  Completed Orders (this encounter) Order Details Reason Side Interpretation Result Initial Treatment Date Region  Dietary management education, guidance, and counseling Encouraged to eat a well balanced diet and follow up with primary care physician.        Hypertension education Continue to monitor blood pressure.  If blood pressure remains elevated, contact your primary care physician          Assessment/Plan # Detail Type Description   1. Assessment Body mass index (BMI) 33.0-33.9, adult [id number]).   Plan Orders Today's instructions / counseling include(s) Dietary management education, guidance, and counseling.       2. Assessment Essential (primary) hypertension (I10).       3. Assessment Elevated blood-pressure reading, w/o diagnosis of htn (R03.0).         Pain Assessment/Treatment Pain Scale: 4/10. Method: Numeric Pain Intensity Scale. Location: neck. Onset: 06/15/2016. Duration: varies. Quality: discomforting. Pain Assessment/Treatment follow-up plan of care: Patient is taking over the counter pain relievers for relief..  Schedule C5-6 ACDF. Nurse education given. Fitted for soft cervical collar.  Orders: Instruction(s)/Education: Assessment Instruction  R03.0 Hypertension education  [phone] Dietary management education, guidance, and counseling             Provider:  Marchia Meiers. Vertell Limber MD  09/19/2016 04:17 PM Dictation edited by: Johnella Moloney    CC Providers: Erline Levine MD [address] [place], [place] [address]              Electronically signed by Marchia Meiers. Vertell Limber MD on 09/19/2016 05:39 PM  Patient ID:   [id number] Patient: Jillian Andrade  Date of Birth: 31-Mar-1961 Visit Type: Office Visit   Date: 07/25/2016 03:45 PM Provider: Marchia Meiers. Vertell Limber MD   This 55 year old female presents for back pain and neck pain.  History of Present Illness: 1.  back pain  2.  neck pain  The patient comes in to review her MRI. She continues to have neck and left arm pain. Her pain is 4/10 today.   Physical Exam: Left biceps weakness persists  MRI reveals C5-6 bone spur on the left that is trapping the exiting C6 nerve, C3-4, C4-5, C5-6, C6-7 mild arthritis       PAST MEDICAL HISTORY, SURGICAL HISTORY, FAMILY HISTORY, SOCIAL HISTORY AND REVIEW OF SYSTEMS  07/16/2016, which I have signed.   MEDICATIONS(added, continued or  stopped this visit): Started Medication Directions Instruction Stopped   cyclobenzaprine 5 mg tablet take 1 tablet by oral route 3 times every day     prednisone take as directed       ALLERGIES: Ingredient Reaction Medication Name Comment  ACETAMINOPHEN Unknown PERCOCET   PROPOXYPHENE HCL Nausea, vomiting Wygesic   OXYCODONE HCL Unknown PERCOCET       Vitals Date Temp F BP Pulse Ht In Wt Lb BMI BSA Pain Score  07/25/2016  115/80 76 64 190 32.61  4/10      IMPRESSION The patient's MRI reveals a large bone spur at C5-6 on the left that is trapping the exiting C6 nerve root. She also has mild arthritis at C3-4, C4-5, C5-6, and C6-7. Her left biceps weakness persists today. She is not in miserable pain and has been managing her pain well, so she would like to try some PT in order to get some relief from her neck and left arm symptoms. If PT is not helpful, then we will likely proceed with a C5-6 ACDF.   Ordered PT. Follow up in 3 weeks.              Provider:  Marchia Meiers. Vertell Limber MD  07/25/2016 05:17 PM Dictation edited by: Johnella Moloney    CC Providers: Erline Levine MD [address] [place], [place] [address]              Electronically signed by Marchia Meiers. Vertell Limber MD on 07/26/2016 02:54 PM  Patient ID:   [id number] Patient: Jillian Andrade  Date of Birth: 06-17-1961 Visit Type: Office Visit   Date: 07/16/2016 12:00 PM Provider: Marchia Meiers. Vertell Limber MD   This 55 year old female presents for back pain and neck pain.  History of Present Illness: 1.  back pain  2.  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  He notes a long history of lumbar discomfort, and pain in her neck and shoulders appearing after a massage in September.  She recalls no specific injury.  After speaking with Dr. Vertell Limber on Thursday, she was taken to the ED by  coworkers when she reported severe rapid onset neck and left-sided head pain.  CT angiogram was performed and read as normal.  Prednisone taper continues, offering some help Flexeril taken only as needed  History: Mitral valve prolapse diagnosed 1994 Surgical history: Hysterectomy 1993, hemorrhoidectomy 1993, ankle 2013  X-ray on [facility] reveal C5-6, C6-7 degeneration, facet arthritis, and left sided bone spurs        PAST MEDICAL/SURGICAL HISTORY   (Detailed)  Disease/disorder Onset Date Management Date Comments    Hemorrhoidectomy  CRR 07/16/2016 -    Ankle surgery  CRR 07/16/2016 -    Hysterectomy       Family History  (Detailed) Relationship Family Member Name Deceased Age at Death Condition Onset Age Cause of Death  Mother  Stroke  N    SOCIAL HISTORY  (Detailed) Tobacco use reviewed. Preferred language is Unknown.   Smoking status: Never smoker.  SMOKING STATUS Use Status Type Smoking Status Usage Per Day Years Used Total Pack Years  no/never  Never smoker             MEDICATIONS(added, continued or stopped this visit): Started Medication Directions Instruction Stopped   cyclobenzaprine 5 mg tablet take 1 tablet by oral route 3 times every day     prednisone take as directed       ALLERGIES: Ingredient Reaction Medication Name Comment  ACETAMINOPHEN Unknown PERCOCET   PROPOXYPHENE HCL Nausea, vomiting Wygesic   OXYCODONE HCL Unknown PERCOCET    Reviewed, updated.    Vitals Date Temp F BP Pulse Ht In Wt Lb BMI BSA Pain Score  07/16/2016  132/81 59 64 188.4 32.34  5/10     PHYSICAL EXAM General Level of Distress: no acute distress Overall Appearance: normal    Cardiovascular Cardiac: regular rate and rhythm without murmur  Right Left  Carotid Pulses: normal normal  Respiratory Lungs: clear to auscultation  Neurological Orientation: normal Recent and Remote Memory: normal Attention Span and Concentration:    normal Language: normal Fund of Knowledge: normal  Right Left Sensation: normal normal Upper Extremity Coordination: normal normal  Lower Extremity Coordination: normal normal  Musculoskeletal Gait and Station: normal  Right Left Upper Extremity Muscle Strength: normal normal Lower Extremity Muscle Strength: normal normal Upper Extremity Muscle Tone:  normal normal Lower Extremity Muscle Tone: normal normal  Motor Strength Upper and lower extremity motor strength was tested in the clinically pertinent muscles. Any abnormal findings will be noted below.   Right Left Biceps:  4/5   Deep Tendon Reflexes  Right Left Biceps: normal normal Triceps: normal normal Brachiloradialis: normal normal Patellar: normal normal Achilles: normal normal  Sensory Sensation was tested at C2 to T1.   Cranial Nerves II. Optic Nerve/Visual Fields: normal III. Oculomotor: normal IV. Trochlear: normal V. Trigeminal: normal VI. Abducens: normal VII. Facial: normal VIII. Acoustic/Vestibular: normal IX. Glossopharyngeal: normal X. Vagus: normal XI. Spinal Accessory: normal XII. Hypoglossal: normal  Motor and other Tests Lhermittes: negative Rhomberg: negative Pronator drift: absent     Right Left Spurlings negative positive Hoffman's: normal normal Clonus: normal normal Babinski: normal normal SLR: negative negative Patrick's Corky Sox): negative negative Toe Walk: normal normal Toe Lift: normal normal Heel Walk: normal normal Tinels Elbow: negative negative Tinels Wrist: negative negative Phalen: negative negative   Additional Findings:  Pain to palpation on posterior neck, negative shoulder impingement bilaterally, left biceps weakness, symmetric reflexes, decreased pin prick sensation in the left hand   DIAGNOSTIC RESULTS X-ray on [facility]opy reveal C5-6, C6-7 degeneration, facet arthritis, and left sided bone spurs    IMPRESSION The patient is experiencing neck and left  arm pain. On review of her X-rays, she has degeneration, facet arthritis, and left sided bone spurs at the C5-6 and C6-7 levels. On confrontational testing, she has decreased pin prick sensation in her left hand, positive Spurlings on the left, and left biceps weakness. I will order a cervical MRI for further workup of her neck and left arm symptoms.   Completed Orders (this encounter) Order Details Reason Side Interpretation Result Initial Treatment Date Region  Cervical Spine- AP/Lat/Obls/Odontoid/Flex/Ex      07/16/2016 All Levels to All Levels   Assessment/Plan # Detail Type Description   1. Assessment Cervicalgia (M54.2).       2. Assessment  Displacement of intervertebral disc of high cervical region (M50.21).       3. Assessment Spinal stenosis, cervical region (M48.02).       4. Assessment Spondylosis of cervical region without myelopathy or radiculopathy (M47.812).       5. Assessment Cervical radiculopathy (M54.12).         Ordered cervical MRI. Follow up after to discuss.   Orders: Diagnostic Procedures: Assessment Procedure  M54.12 Cervical Spine- AP/Lat/Obls/Odontoid/Flex/Ex  M54.12 MRI Spinal/cerv W/o Contrast             Provider:  Marchia Meiers. Vertell Limber MD  07/16/2016 02:06 PM Dictation edited by: Johnella Moloney    CC Providers: Erline Levine MD [address] [place], [place] [address]              Electronically signed by Marchia Meiers. Vertell Limber MD on 07/17/2016 05:02 PM

## 2016-10-04 NOTE — Interval H&P Note (Signed)
History and Physical Interval Note:  10/04/2016 7:11 AM  Jillian Andrade  has presented today for surgery, with the diagnosis of CERVICAL HERNIATED [name]  The various methods of treatment have been discussed with the patient and family. After consideration of risks, benefits and other options for treatment, the patient has consented to  Procedure(s) with comments: C5-6 ANTERIOR CERVICAL DECOMPRESSION/DISCECTOMY/FUSION (N/A) - C5-6 ANTERIOR CERVICAL DECOMPRESSION/DISCECTOMY/FUSION as a surgical intervention .  The patient's history has been reviewed, patient examined, no change in status, stable for surgery.  I have reviewed the patient's chart and labs.  Questions were answered to the patient's satisfaction.     Rafael Salway D

## 2016-10-05 ENCOUNTER — Encounter (HOSPITAL_COMMUNITY): Payer: Self-pay | Admitting: Neurosurgery

## 2016-10-05 MED ORDER — METHOCARBAMOL 500 MG PO TABS: 500.0000 mg | ORAL_TABLET | Freq: Four times a day (QID) | ORAL | 1 refills | Status: DC | PRN

## 2016-10-05 MED ORDER — TRAMADOL HCL 50 MG PO TABS: 50.0000 mg | ORAL_TABLET | Freq: Four times a day (QID) | ORAL | 1 refills | Status: DC | PRN

## 2016-10-05 MED FILL — traMADol HCL 50 MG TABS: 50 | 15 days supply | Qty: 60 | Fill #0

## 2016-10-05 MED FILL — METHOCARBAMOL 500 MG TABLET: 500 | 15 days supply | Qty: 60 | Fill #0

## 2016-10-05 NOTE — Evaluation (Signed)
Physical Therapy Evaluation and Discharge Patient Details Name: Jillian Andrade MRN: [id number] DOB: 1961-02-06 Today's Date: 10/05/2016   History of Present Illness  Pt is a 55 y/o female who presents s/p C5-C6 ACDF on 10/04/16.  Clinical Impression  Patient evaluated by Physical Therapy with no further acute PT needs identified. All education has been completed and the patient has no further questions. At the time of PT eval pt was able to perform transfers and ambulation with modified independence. See below for any follow-up Physical Therapy or equipment needs. PT is signing off. Thank you for this referral.     Follow Up Recommendations No PT follow up    Equipment Recommendations  None recommended by PT    Recommendations for Other Services       Precautions / Restrictions Precautions Precautions: Fall;Cervical Precaution Comments: Reviewed cervical precautions verbally during functional mobility.  Required Braces or Orthoses: Cervical Brace Cervical Brace: Soft collar Restrictions Weight Bearing Restrictions: No      Mobility  Bed Mobility               General bed mobility comments: Pt received sitting up in recliner after OT session  Transfers Overall transfer level: Modified independent Equipment used: None             General transfer comment: No assist required. No unsteadiness noted.   Ambulation/Gait Ambulation/Gait assistance: Modified independent (Device/Increase time) Ambulation Distance (Feet): 300 Feet Assistive device: None Gait Pattern/deviations: WFL(Within Functional Limits) Gait velocity: Decreased Gait velocity interpretation: Below normal speed for age/gender General Gait Details: VC's for maintenance of precautions during gait training  Stairs Stairs: Yes Stairs assistance: Modified independent (Device/Increase time) Stair Management: One rail Right;Alternating pattern;Forwards Number of Stairs: 10 General stair comments:  Pt was able to negotiate stairs without assist and without difficulty.   Wheelchair Mobility    Modified Rankin (Stroke Patients Only)       Balance Overall balance assessment: No apparent balance deficits (not formally assessed)                                           Pertinent Vitals/Pain Pain Assessment: Faces Faces Pain Scale: Hurts a little bit Pain Location: Incision site Pain Descriptors / Indicators: Operative site guarding Pain Intervention(s): Limited activity within patient's tolerance;Monitored during session;Repositioned    Home Living Family/patient expects to be discharged to:: Private residence Living Arrangements: Spouse/significant other Available Help at Discharge: Family;Available 24 hours/day Type of Home: House Home Access: Stairs to enter   Technical brewer of Steps: 3 Home Layout: Laundry or work area in [employer]: None      Prior Function Level of Independence: Independent         Comments: Works in [employer] [place]        Extremity/Trunk Assessment   Upper Extremity Assessment Upper Extremity Assessment: Defer to OT evaluation    Lower Extremity Assessment Lower Extremity Assessment: Overall WFL for tasks assessed    Cervical / Trunk Assessment Cervical / Trunk Assessment: Other exceptions Cervical / Trunk Exceptions: s/p surgery  Communication   Communication: No difficulties  Cognition Arousal/Alertness: Awake/alert Behavior During Therapy: WFL for tasks assessed/performed Overall Cognitive Status: Within Functional Limits for tasks assessed  General Comments      Exercises     Assessment/Plan    PT Assessment Patent does not need any further PT services  PT Problem List            PT Treatment Interventions      PT Goals (Current goals can be found in the Care Plan section)  Acute Rehab PT Goals Patient Stated Goal: Home  today PT Goal Formulation: All assessment and education complete, DC therapy    Frequency     Barriers to discharge        Co-evaluation               End of Session Equipment Utilized During Treatment: Cervical collar Activity Tolerance: Patient tolerated treatment well Patient left: in chair;with call bell/phone within reach Nurse Communication: Mobility status         Time: [id number] PT Time Calculation (min) (ACUTE ONLY): 9 min   Charges:   PT Evaluation $PT Eval Low Complexity: 1 Procedure     PT G Codes:        Thelma Comp October 07, 2016, 2:19 PM  Rolinda Roan, PT, DPT Acute Rehabilitation Services Pager: [phone]

## 2016-10-05 NOTE — Progress Notes (Signed)
Patient alert and oriented, mae's well, voiding adequate amount of urine, swallowing without difficulty, no c/o pain at time of discharge. Patient discharged home with family. Script and discharged instructions given to patient. Patient and family stated understanding of instructions given. Patient has an appointment with Dr.Stern    

## 2016-10-05 NOTE — Discharge Summary (Signed)
Physician Discharge Summary  Patient ID: Jillian Andrade MRN: [id number] DOB/AGE: [birth year] 55 y.o.  Admit date: 10/04/2016 Discharge date: 10/05/2016  Admission Diagnoses:CERVICAL HERNIATED [place], C 56, stenosis, radiculopathy, cervicalgia   Discharge Diagnoses: CERVICAL HERNIATED [facility] WITH MYELOPATHY, C 56, stenosis, radiculopathy, cervicalgia s/p CERVICAL FIVE-CERVICAL SIX ANTERIOR CERVICAL DECOMPRESSION/DISCECTOMY/FUSION (N/A) - C5-6 ANTERIOR CERVICAL DECOMPRESSION/DISCECTOMY/FUSION with PEEK cage, autograft, plate  Active Problems:   Herniated cervical disc without myelopathy   Discharged Condition: good  Hospital Course: Jillian Andrade was admitted for surgery with dx cervical HNP with myelopathy. Following uncomplicated ACDF, she recovered with some nausea and vomiting that quickly resolved. She progressed nicely, mobilizing well with improved strength.   Consults: None  Significant Diagnostic Studies: radiology: X-Ray: intra-op  Treatments: surgery: CERVICAL FIVE-CERVICAL SIX ANTERIOR CERVICAL DECOMPRESSION/DISCECTOMY/FUSION (N/A) - C5-6 ANTERIOR CERVICAL DECOMPRESSION/DISCECTOMY/FUSION with PEEK cage, autograft, plate   Discharge Exam: Blood pressure 130/72, pulse 62, temperature 98.6 F (37 C), temperature source Oral, resp. rate 18, SpO2 97 %. Alert, conversant, ambulating in hallway. Reports no pain at present. Improved LUE strength; good strength BUE. Incision without erythema, swelling, or drainage beneath honeycomb &Dermabond. Reports sore throat as expected, without dysphagia.   Disposition: 01-Home or Self Care  Rx for Tramadol &Robaxin to chart for prn home use. Pt verbalizes understanding of d/c instructions. She has f/u appt in January with DrStern.     Allergies as of 10/05/2016      Reactions   Bee Venom Anaphylaxis   Eggs Or Egg-derived Products Nausea And Vomiting   Orange Juice [orange Oil] Hives, Nausea And  Vomiting   Oxycodone Hives   Wygesic [propoxyphene N-acetaminophen] Nausea And Vomiting      Medication List    TAKE these medications   ALIVE WOMENS ENERGY Tabs Take 1 tablet by mouth daily.   cyclobenzaprine 5 MG tablet Commonly known as:  FLEXERIL Take 1 tablet (5 mg total) by mouth 3 (three) times daily as needed for muscle spasms.   diphenhydrAMINE 25 mg capsule Commonly known as:  BENADRYL Take 25 mg by mouth every 6 (six) hours as needed for allergies or sleep.   methocarbamol 500 MG tablet Commonly known as:  ROBAXIN Take 1 tablet (500 mg total) by mouth every 6 (six) hours as needed for muscle spasms.   predniSONE 10 MG tablet Commonly known as:  DELTASONE 3 tabs by mouth per day for 3 days,2tabs per day for 3 days,1tab per day for 3 days   traMADol 50 MG tablet Commonly known as:  ULTRAM Take 1 tablet (50 mg total) by mouth every 6 (six) hours as needed. What changed:  Another medication with the same name was added. Make sure you understand how and when to take each.   traMADol 50 MG tablet Commonly known as:  ULTRAM Take 1 tablet (50 mg total) by mouth every 6 (six) hours as needed for moderate pain. What changed:  You were already taking a medication with the same name, and this prescription was added. Make sure you understand how and when to take each.   traZODone 50 MG tablet Commonly known as:  DESYREL Take 0.5-1 tablets (25-50 mg total) by mouth at bedtime as needed for sleep.   triamcinolone cream 0.1 % Commonly known as:  KENALOG Apply 1 application topically 2 (two) times daily.        Signed: Peggyann Shoals, MD 10/05/2016, 7:45 AM

## 2016-10-05 NOTE — Evaluation (Signed)
Occupational Therapy Evaluation Patient Details Name: Jillian Andrade MRN: [id number] DOB: Feb 19, 1961 Today's Date: 10/05/2016    History of Present Illness Pt is a 55 y/o female who presents s/p C5-C6 ACDF on 10/04/16.   Clinical Impression   Patient evaluated by Occupational Therapy with no further acute OT needs identified. All education has been completed and the patient has no further questions. See below for any follow-up Occupational Therapy or equipment needs. OT to sign off. Thank you for referral.      Follow Up Recommendations  No OT follow up    Equipment Recommendations  None recommended by OT    Recommendations for Other Services       Precautions / Restrictions Precautions Precautions: Fall;Cervical Precaution Comments: Reviewed cervical precautions verbally during functional mobility.  Required Braces or Orthoses: Cervical Brace Cervical Brace: Soft collar Restrictions Weight Bearing Restrictions: No      Mobility Bed Mobility               General bed mobility comments: in chair on arrival  Transfers Overall transfer level: Modified independent Equipment used: None             General transfer comment: No assist required. No unsteadiness noted.     Balance Overall balance assessment: No apparent balance deficits (not formally assessed)                                          ADL Overall ADL's : Independent       Cervical precautions ( handout provided): Educated patient on don doff brace with return demonstration, educated on oral care using cups, washing face with cloth, never to wash directly on incision site, avoid neck rotation flexion and extension, positioning with pillows in chair for bil UE, sleeping positioning, avoiding pushing / pulling with bil UE, and fine motor exercises ( handout provided).Pt educated on need to notify doctor / RN of swallowing changes or choking..                                          Vision     Perception     Praxis      Pertinent Vitals/Pain Pain Assessment: Faces Faces Pain Scale: Hurts a little bit Pain Location: Incision site Pain Descriptors / Indicators: Operative site guarding Pain Intervention(s): Limited activity within patient's tolerance;Monitored during session;Repositioned     Hand Dominance     Extremity/Trunk Assessment Upper Extremity Assessment Upper Extremity Assessment: Overall WFL for tasks assessed   Lower Extremity Assessment Lower Extremity Assessment: Defer to PT evaluation   Cervical / Trunk Assessment Cervical / Trunk Assessment: Other exceptions Cervical / Trunk Exceptions: s/p surgery   Communication Communication Communication: No difficulties   Cognition Arousal/Alertness: Awake/alert Behavior During Therapy: WFL for tasks assessed/performed Overall Cognitive Status: Within Functional Limits for tasks assessed                     General Comments       Exercises       Shoulder Instructions      Home Living Family/patient expects to be discharged to:: Private residence Living Arrangements: Spouse/significant other Available Help at Discharge: Family;Available 24 hours/day Type of Home: House Home Access: Stairs to enter [employer] of Steps:  3   Home Layout: Laundry or work area in [place] Shower/Tub: Teacher, early years/pre: Standard Bathroom Accessibility: Yes   Home Equipment: None          Prior Functioning/Environment Level of Independence: Independent        Comments: Works in [place] at [employer]        OT Problem List:     OT Treatment/Interventions:      OT Goals(Current goals can be found in the care plan section) Acute Rehab OT Goals Patient Stated Goal: Home today  OT Frequency:     Barriers to D/C:            Co-evaluation              End of Session Equipment Utilized During Treatment: Gait  belt Nurse Communication: Mobility status;Precautions  Activity Tolerance: Patient tolerated treatment well Patient left: in chair;with call bell/phone within reach;with chair alarm set   Time: [id number] OT Time Calculation (min): 11 min Charges:  OT General Charges $OT Visit: 1 Procedure OT Evaluation $OT Eval Low Complexity: 1 Procedure G-Codes:    Peri Maris 13-Oct-2016, 2:42 PM   Jeri Modena   OTR/L Pager: [phone] Office: [phone] .

## 2016-10-05 NOTE — Progress Notes (Addendum)
Subjective: Patient reports "I feel good"  Objective: Vital signs in last 24 hours: Temp:  [97.5 F (36.4 C)-99.8 F (37.7 C)] 98.6 F (37 C) (12/22 0410) Pulse Rate:  [56-78] 62 (12/22 0410) Resp:  [14-18] 18 (12/22 0410) BP: (125-154)/(72-87) 130/72 (12/22 0410) SpO2:  [95 %-99 %] 97 % (12/22 0410)  Intake/Output from previous day: 12/21 0701 - 12/22 0700 In: 1040 [P.O.:240; I.V.:700] Out: 100 [Blood:100] Intake/Output this shift: No intake/output data recorded.  Alert, conversant, ambulating in hallway. Reports no pain at present. Improved LUE strength; good strength BUE. Incision without erythema, swelling, or drainage beneath honeycomb & Dermabond. Reports sore throat as expected, without dysphagia.   Lab Results:  Recent Labs  10/02/16 0921  WBC 6.6  HGB 12.8  HCT 38.9  PLT 159   BMET  Recent Labs  10/02/16 0921  NA 141  K 4.4  CL 110  CO2 23  GLUCOSE 110*  BUN 12  CREATININE 0.96  CALCIUM 9.4    Studies/Results: Dg Cervical Spine 2 Or 3 Views  Result Date: 10/04/2016 CLINICAL DATA:  Anterior fusion C5-6 EXAM: CERVICAL SPINE - 2-3 VIEW COMPARISON:  Cervical MRI July 25, 2016 FINDINGS: Cross-table lateral cervical spine image labeled #1 demonstrates a metallic probe anterior to the C5-6 interspace. No fracture or spondylolisthesis. Cross-table lateral cervical spine image labeled #2 demonstrates metallic probes overlying the anterior aspects of the C4-5 and C5-6 interspaces. No fracture or spondylolisthesis. Cross-table lateral cervical spine image labeled FINAL shows anterior screw and plate fixation at C5 and C6 with support hardware intact. No fracture or spondylolisthesis. IMPRESSION: Anterior fusion at C5 and C6 with screw and plate fixation device appearing intact. No fracture or spondylolisthesis evident. Electronically Signed   By: Lowella Grip III M.D.   On: 10/04/2016 09:16    Assessment/Plan: Improved  LOS: 1 day  Per DrStern, d/c IV,  d/c to home. Rx for Tramadol & Robaxin to chart for prn home use. Pt verbalizes understanding of d/c instructions. She has f/u appt in January with DrStern.    Verdis Prime 10/05/2016, 7:31 AM   Doing well.  Discharge home.

## 2016-10-05 NOTE — Discharge Instructions (Signed)

## 2016-11-07 DIAGNOSIS — M5021 Other cervical disc displacement,  high cervical region: Secondary | ICD-10-CM | POA: Diagnosis not present

## 2016-11-07 DIAGNOSIS — R030 Elevated blood-pressure reading, without diagnosis of hypertension: Secondary | ICD-10-CM | POA: Diagnosis not present

## 2016-11-07 DIAGNOSIS — M542 Cervicalgia: Secondary | ICD-10-CM | POA: Diagnosis not present

## 2016-11-07 DIAGNOSIS — M5412 Radiculopathy, cervical region: Secondary | ICD-10-CM | POA: Diagnosis not present

## 2016-11-07 DIAGNOSIS — Z6834 Body mass index (BMI) 34.0-34.9, adult: Secondary | ICD-10-CM | POA: Diagnosis not present

## 2016-12-19 DIAGNOSIS — M5412 Radiculopathy, cervical region: Secondary | ICD-10-CM | POA: Diagnosis not present

## 2016-12-19 DIAGNOSIS — M542 Cervicalgia: Secondary | ICD-10-CM | POA: Diagnosis not present

## 2016-12-19 DIAGNOSIS — M5021 Other cervical disc displacement,  high cervical region: Secondary | ICD-10-CM | POA: Diagnosis not present

## 2017-01-24 ENCOUNTER — Other Ambulatory Visit: Payer: Self-pay | Admitting: Obstetrics & Gynecology

## 2017-01-24 DIAGNOSIS — Z1231 Encounter for screening mammogram for malignant neoplasm of breast: Secondary | ICD-10-CM

## 2017-02-18 ENCOUNTER — Ambulatory Visit: Payer: 59

## 2017-03-04 DIAGNOSIS — Z01419 Encounter for gynecological examination (general) (routine) without abnormal findings: Secondary | ICD-10-CM | POA: Diagnosis not present

## 2017-03-04 DIAGNOSIS — Z6832 Body mass index (BMI) 32.0-32.9, adult: Secondary | ICD-10-CM | POA: Diagnosis not present

## 2017-03-04 MED FILL — ESTRADIOL 1 MG TABLET: 1 | 90 days supply | Qty: 90 | Fill #0

## 2017-03-05 ENCOUNTER — Ambulatory Visit
Admission: RE | Admit: 2017-03-05 | Discharge: 2017-03-05 | Disposition: A | Discharge status: Home / Self Care | Payer: 59 | Source: Ambulatory Visit | Attending: Obstetrics & Gynecology | Admitting: Obstetrics & Gynecology

## 2017-03-05 DIAGNOSIS — Z1231 Encounter for screening mammogram for malignant neoplasm of breast: Secondary | ICD-10-CM

## 2017-03-06 IMAGING — CR DG CERVICAL SPINE 2 OR 3 VIEWS
3 series · 3 of 3 positions shown · non-contrast
Comparison: Cervical MRI July 25, 2016

CLINICAL DATA: Anterior fusion C5-6

EXAM:
CERVICAL SPINE - 2-3 VIEW

[AP (1 of 3)]
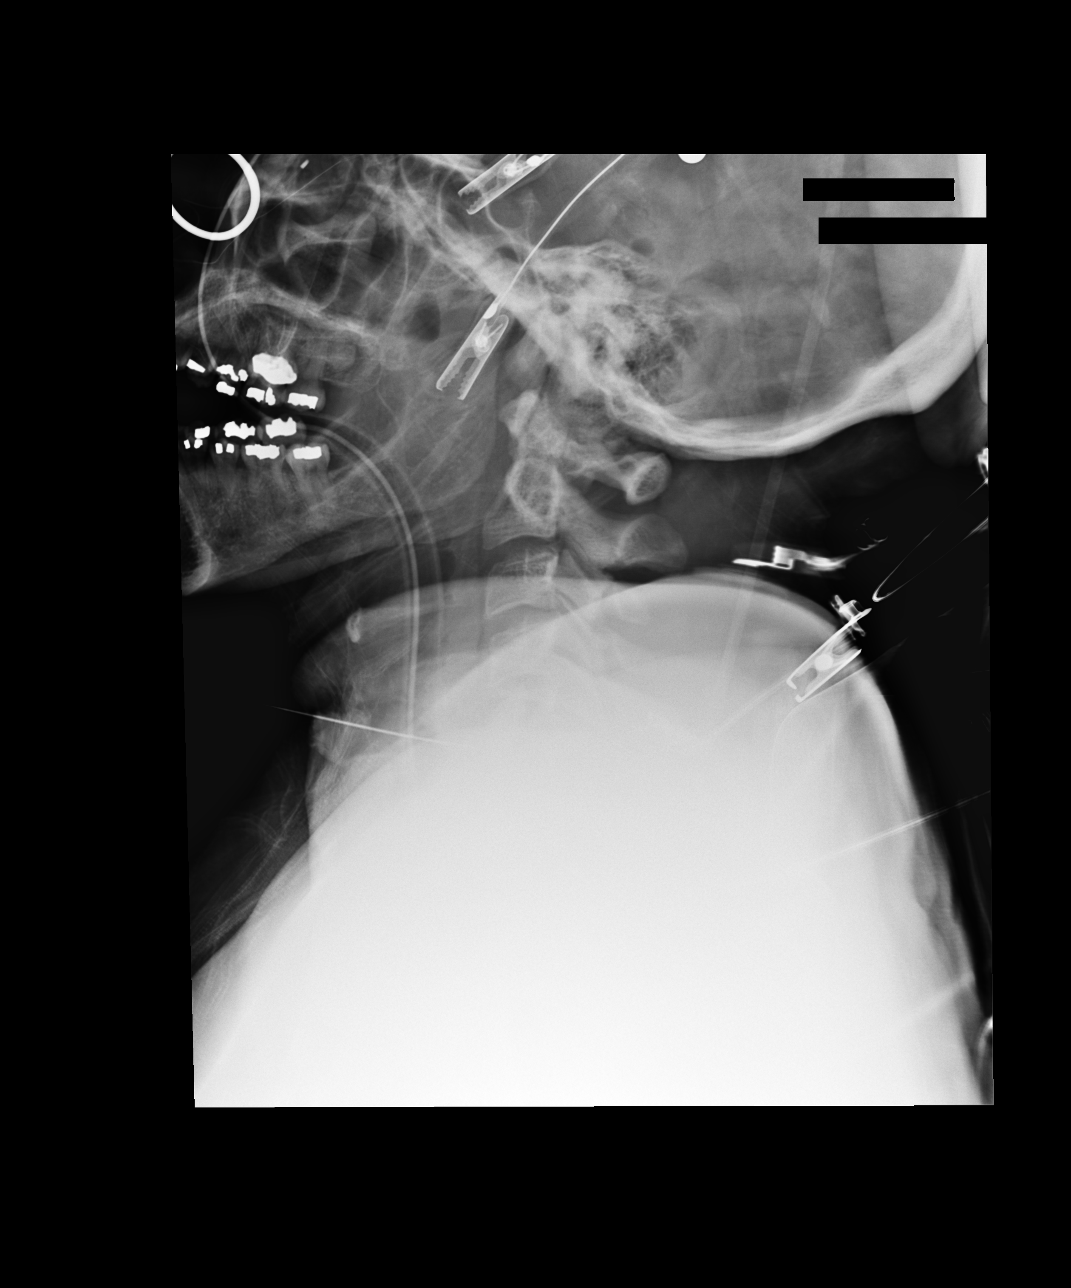

[AP (2 of 3)]
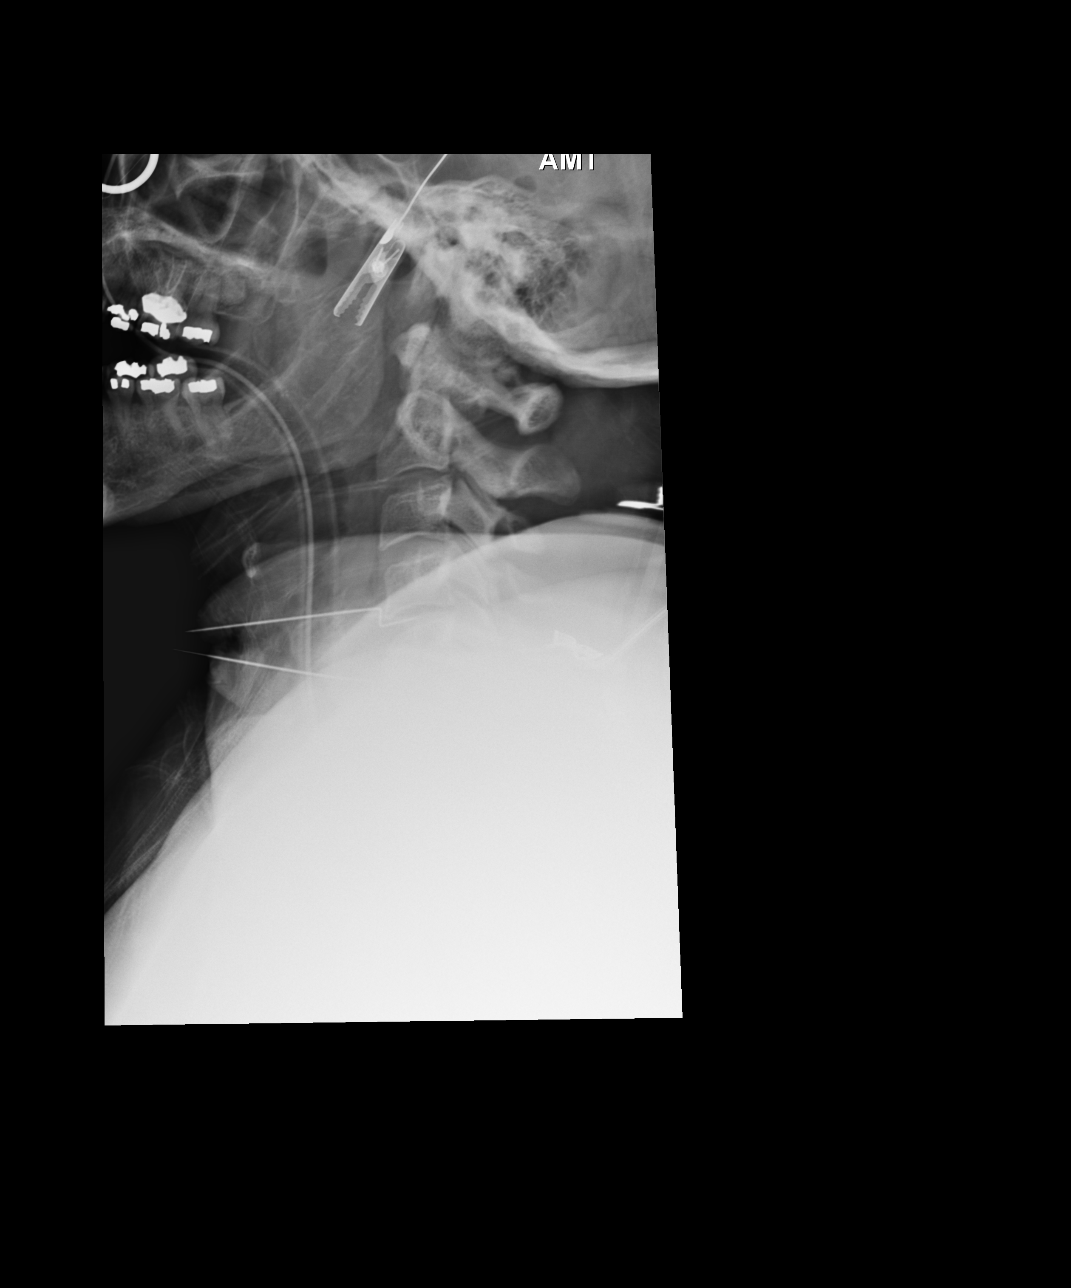

[AP (3 of 3)]
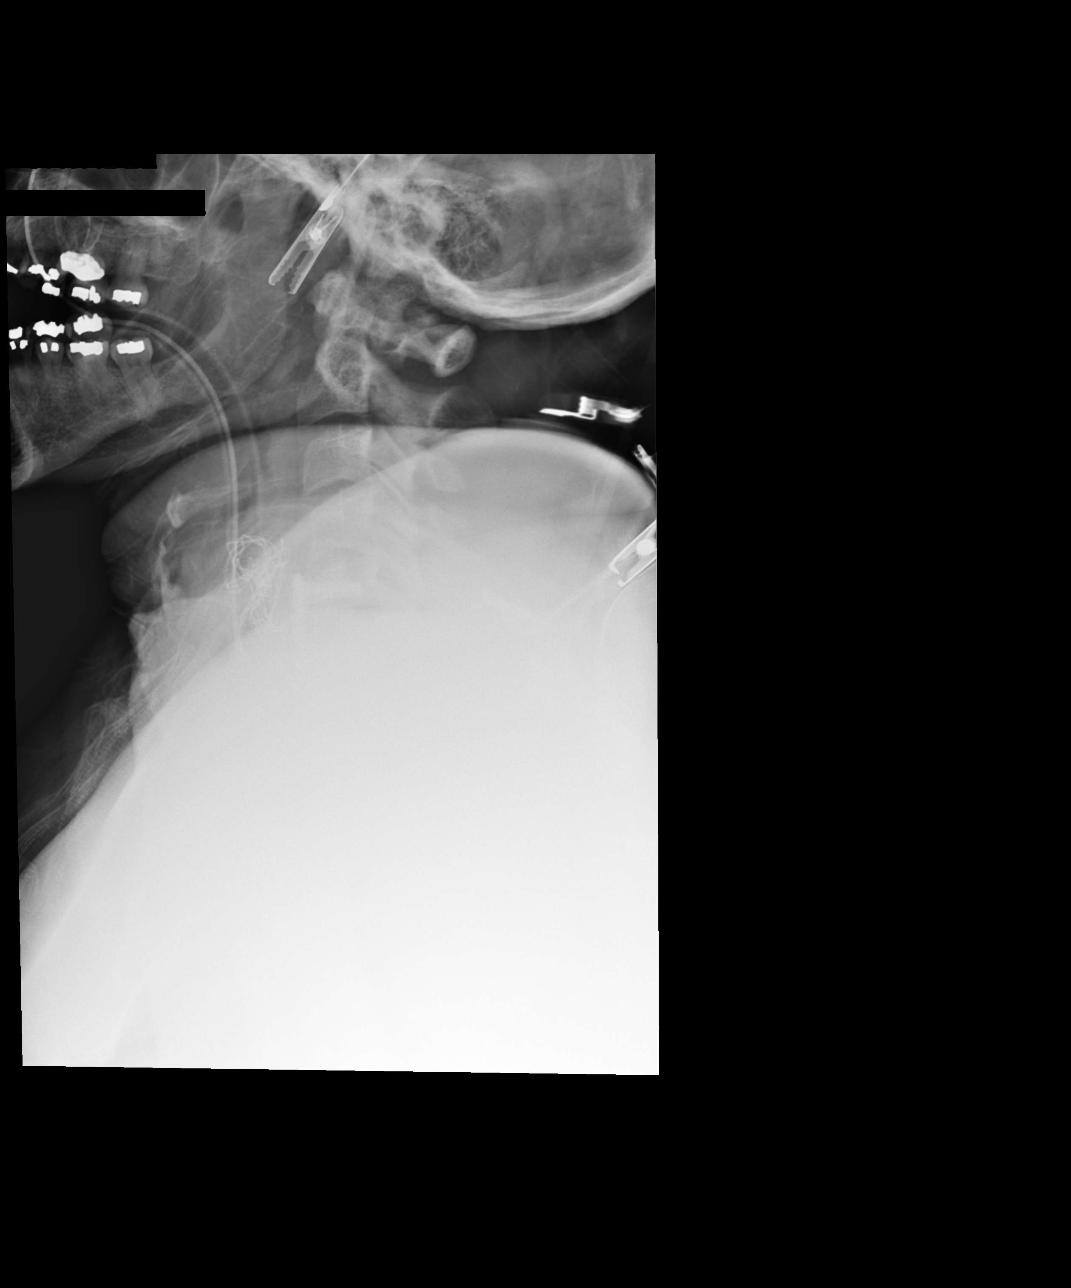

[3 of 3 positions shown; findings below may reference images not displayed]

FINDINGS: Cross-table lateral cervical spine image labeled #1 demonstrates a
metallic probe anterior to the C5-6 interspace. No fracture or
spondylolisthesis.

Cross-table lateral cervical spine image labeled #2 demonstrates
metallic probes overlying the anterior aspects of the C4-5 and C5-6
interspaces. No fracture or spondylolisthesis.

Cross-table lateral cervical spine image labeled FINAL shows
anterior screw and plate fixation at C5 and C6 with support hardware
intact. No fracture or spondylolisthesis.
IMPRESSION: Anterior fusion at C5 and C6 with screw and plate fixation device
appearing intact. No fracture or spondylolisthesis evident.

## 2017-06-26 MED FILL — ESTRADIOL 1 MG TABLET: 1 | 90 days supply | Qty: 90 | Fill #1

## 2017-08-26 ENCOUNTER — Other Ambulatory Visit (INDEPENDENT_AMBULATORY_CARE_PROVIDER_SITE_OTHER): Payer: 59

## 2017-08-26 ENCOUNTER — Other Ambulatory Visit: Payer: Self-pay | Admitting: Internal Medicine

## 2017-08-26 ENCOUNTER — Ambulatory Visit (INDEPENDENT_AMBULATORY_CARE_PROVIDER_SITE_OTHER): Payer: 59 | Admitting: Internal Medicine

## 2017-08-26 ENCOUNTER — Encounter: Payer: Self-pay | Admitting: Internal Medicine

## 2017-08-26 VITALS — BP 124/80 | HR 68 | Temp 98.5°F | Ht 64.0 in | Wt 190.0 lb

## 2017-08-26 DIAGNOSIS — Z114 Encounter for screening for human immunodeficiency virus [HIV]: Secondary | ICD-10-CM

## 2017-08-26 DIAGNOSIS — Z0000 Encounter for general adult medical examination without abnormal findings: Secondary | ICD-10-CM

## 2017-08-26 LAB — CBC WITH DIFFERENTIAL/PLATELET
BASOS ABS: 0.1 10*3/uL (ref 0.0–0.1)
BASOS PCT: 0.6 % (ref 0.0–3.0)
EOS ABS: 0.1 10*3/uL (ref 0.0–0.7)
Eosinophils Relative: 1.3 % (ref 0.0–5.0)
HCT: 40.9 % (ref 36.0–46.0)
Hemoglobin: 13.5 g/dL (ref 12.0–15.0)
LYMPHS ABS: 2.1 10*3/uL (ref 0.7–4.0)
LYMPHS PCT: 24.2 % (ref 12.0–46.0)
MCHC: 33.1 g/dL (ref 30.0–36.0)
MCV: 90.9 fl (ref 78.0–100.0)
MONOS PCT: 7.2 % (ref 3.0–12.0)
Monocytes Absolute: 0.6 10*3/uL (ref 0.1–1.0)
NEUTROS ABS: 5.7 10*3/uL (ref 1.4–7.7)
NEUTROS PCT: 66.7 % (ref 43.0–77.0)
PLATELETS: 154 10*3/uL (ref 150.0–400.0)
RBC: 4.51 Mil/uL (ref 3.87–5.11)
RDW: 13.4 % (ref 11.5–15.5)
WBC: 8.5 10*3/uL (ref 4.0–10.5)

## 2017-08-26 LAB — BASIC METABOLIC PANEL
BUN: 14 mg/dL (ref 6–23)
CALCIUM: 9.9 mg/dL (ref 8.4–10.5)
CHLORIDE: 105 meq/L (ref 96–112)
CO2: 28 meq/L (ref 19–32)
CREATININE: 0.96 mg/dL (ref 0.40–1.20)
GFR: 77.83 mL/min (ref >60.00)
GLUCOSE: 96 mg/dL (ref 70–99)
Potassium: 3.8 mEq/L (ref 3.5–5.1)
Sodium: 142 mEq/L (ref 135–145)

## 2017-08-26 LAB — TSH: TSH: 0.63 u[IU]/mL (ref 0.35–4.50)

## 2017-08-26 LAB — LIPID PANEL
Cholesterol: 224 mg/dL — ABNORMAL HIGH (ref 0–200)
HDL: 57.6 mg/dL (ref >39.00)
LDL Cholesterol: 147 mg/dL — ABNORMAL HIGH (ref 0–99)
NonHDL: 166.05
TRIGLYCERIDES: 97 mg/dL (ref 0.0–149.0)
Total CHOL/HDL Ratio: 4
VLDL: 19.4 mg/dL (ref 0.0–40.0)

## 2017-08-26 LAB — HEPATIC FUNCTION PANEL
ALBUMIN: 4.4 g/dL (ref 3.5–5.2)
ALK PHOS: 59 U/L (ref 39–117)
ALT: 20 U/L (ref 0–35)
AST: 22 U/L (ref 0–37)
BILIRUBIN DIRECT: 0.1 mg/dL (ref 0.0–0.3)
Total Bilirubin: 0.5 mg/dL (ref 0.2–1.2)
Total Protein: 7.5 g/dL (ref 6.0–8.3)

## 2017-08-26 LAB — URINALYSIS, ROUTINE W REFLEX MICROSCOPIC
BILIRUBIN URINE: NEGATIVE
Hgb urine dipstick: NEGATIVE
Ketones, ur: NEGATIVE
LEUKOCYTES UA: NEGATIVE
Nitrite: NEGATIVE
PH: 7 (ref 5.0–8.0)
RBC / HPF: NONE SEEN (ref 0–?)
Specific Gravity, Urine: 1.02 (ref 1.000–1.030)
TOTAL PROTEIN, URINE-UPE24: NEGATIVE
Urine Glucose: NEGATIVE
Urobilinogen, UA: 0.2 (ref 0.0–1.0)
WBC, UA: NONE SEEN (ref 0–?)

## 2017-08-26 MED ORDER — ROSUVASTATIN CALCIUM 20 MG PO TABS: 20.0000 mg | ORAL_TABLET | Freq: Every day | ORAL | 3 refills | Status: DC

## 2017-08-26 MED FILL — ROSUVASTATIN CALCIUM 20 MG: 20 | 90 days supply | Qty: 90 | Fill #0

## 2017-08-26 NOTE — Patient Instructions (Signed)
Please continue all other medications as before, and refills have been done if requested.  Please have the pharmacy call with any other refills you may need.  Please continue your efforts at being more active, low cholesterol diet, and weight control.  You are otherwise up to date with prevention measures today.  Please keep your appointments with your specialists as you may have planned  Please return in 1 year for your yearly visit, or sooner if needed, with Lab testing done 3-5 days before  

## 2017-08-26 NOTE — Progress Notes (Signed)
Subjective:    Patient ID: Jillian Andrade, female    DOB: Mar 03, 1961, 56 y.o.   MRN: [id number]  HPI  Here for wellness and f/u;  Overall doing ok;  Pt denies Chest pain, worsening SOB, DOE, wheezing, orthopnea, PND, worsening LE edema, palpitations, dizziness or syncope.  Pt denies neurological change such as new headache, facial or extremity weakness.  Pt denies polydipsia, polyuria, or low sugar symptoms. Pt states overall good compliance with treatment and medications, good tolerability, and has been trying to follow appropriate diet.  Pt denies worsening depressive symptoms, suicidal ideation or panic. No fever, night sweats, wt loss, loss of appetite, or other constitutional symptoms.  Pt states good ability with ADL's, has low fall risk, home safety reviewed and adequate, no other significant changes in hearing or vision, and only occasionally active with exercise. Going through divorce now. No new complaints or interval hx Past Medical History:  Diagnosis Date  . Complication of anesthesia    n/v  . Fibroids in ovaries    pain in left side  . GERD (gastroesophageal reflux disease)    no medications at this time  . Heart murmur    back in the 90s with the mitral valve prolapse  . Insomnia 05/01/2016  . Lumbar disc disease 05/01/2016  . MVP (mitral valve prolapse) mild, no cardiologist  . No pertinent past medical history   . PONV (postoperative nausea and vomiting)    Past Surgical History:  Procedure Laterality Date  . ABDOMINAL HYSTERECTOMY    . COLONOSCOPY    . DILATION AND CURETTAGE OF UTERUS    . HEMORRHOID SURGERY      reports that she is a non-smoker but has been exposed to tobacco smoke. she has never used smokeless tobacco. She reports that she does not drink alcohol or use drugs. family history includes Glaucoma in her mother; Hypertension in her father; Stroke in her father. Allergies  Allergen Reactions  . Bee Venom Anaphylaxis  . Eggs Or Egg-Derived Products  Nausea And Vomiting  . Orange Juice [Orange Oil] Hives and Nausea And Vomiting  . Oxycodone Hives  . Wygesic [Propoxyphene N-Acetaminophen] Nausea And Vomiting   Current Outpatient Medications on File Prior to Visit  Medication Sig Dispense Refill  . Multiple Vitamins-Minerals (ALIVE WOMENS ENERGY) TABS Take 1 tablet by mouth daily.     No current facility-administered medications on file prior to visit.    Review of Systems Constitutional: Negative for other unusual diaphoresis, sweats, appetite or weight changes HENT: Negative for other worsening hearing loss, ear pain, facial swelling, mouth sores or neck stiffness.   Eyes: Negative for other worsening pain, redness or other visual disturbance.  Respiratory: Negative for other stridor or swelling Cardiovascular: Negative for other palpitations or other chest pain  Gastrointestinal: Negative for worsening diarrhea or loose stools, blood in stool, distention or other pain Genitourinary: Negative for hematuria, flank pain or other change in urine volume.  Musculoskeletal: Negative for myalgias or other joint swelling.  Skin: Negative for other color change, or other wound or worsening drainage.  Neurological: Negative for other syncope or numbness. Hematological: Negative for other adenopathy or swelling Psychiatric/Behavioral: Negative for hallucinations, other worsening agitation, SI, self-injury, or new decreased concentration All other system neg per pt    Objective:   Physical Exam BP 124/80   Pulse 68   Temp 98.5 F (36.9 C) (Oral)   Ht 5\' 4"  (1.626 m)   Wt 190 lb (86.2 kg)  SpO2 99%   BMI 32.61 kg/m  VS noted,  Constitutional: Pt is oriented to person, place, and time. Appears well-developed and well-nourished, in no significant distress and comfortable Head: Normocephalic and atraumatic  Eyes: Conjunctivae and EOM are normal. Pupils are equal, round, and reactive to light Right Ear: External ear normal without  discharge Left Ear: External ear normal without discharge Nose: Nose without discharge or deformity Mouth/Throat: Oropharynx is without other ulcerations and moist  Neck: Normal range of motion. Neck supple. No JVD present. No tracheal deviation present or significant neck LA or mass Cardiovascular: Normal rate, regular rhythm, normal heart sounds and intact distal pulses.   Pulmonary/Chest: WOB normal and breath sounds without rales or wheezing  Abdominal: Soft. Bowel sounds are normal. NT. No HSM  Musculoskeletal: Normal range of motion. Exhibits no edema Lymphadenopathy: Has no other cervical adenopathy.  Neurological: Pt is alert and oriented to person, place, and time. Pt has normal reflexes. No cranial nerve deficit. Motor grossly intact, Gait intact Skin: Skin is warm and dry. No rash noted or new ulcerations Psychiatric:  Has normal mood and affect. Behavior is normal without agitation No other exam findings Lab Results  Component Value Date   WBC 8.5 08/26/2017   HGB 13.5 08/26/2017   HCT 40.9 08/26/2017   PLT 154.0 08/26/2017   GLUCOSE 96 08/26/2017   CHOL 224 (H) 08/26/2017   TRIG 97.0 08/26/2017   HDL 57.60 08/26/2017   LDLCALC 147 (H) 08/26/2017   ALT 20 08/26/2017   AST 22 08/26/2017   NA 142 08/26/2017   K 3.8 08/26/2017   CL 105 08/26/2017   CREATININE 0.96 08/26/2017   BUN 14 08/26/2017   CO2 28 08/26/2017   TSH 0.63 08/26/2017         Assessment & Plan:

## 2017-08-26 NOTE — Assessment & Plan Note (Signed)

## 2017-08-27 ENCOUNTER — Telehealth: Payer: Self-pay

## 2017-08-27 LAB — HIV ANTIBODY (ROUTINE TESTING W REFLEX): HIV 1&2 Ab, 4th Generation: NONREACTIVE

## 2017-08-27 NOTE — Telephone Encounter (Signed)
Pt has been informed and expressed understanding.  

## 2017-08-27 NOTE — Telephone Encounter (Signed)
-----   Message from Biagio Borg, MD sent at 08/26/2017  5:22 PM EST ----- Left message on MyChart, pt to cont same tx except  The test results show that your current treatment is OK, except the LDL cholesterol is moderately high.  We should start a medication for this, to help reduce your future heart and stroke risk.  A new prescription will be sent, and you should hear from the office as well.Redmond Baseman to please inform pt, I will do rx

## 2017-10-10 MED FILL — ESTRADIOL 1 MG TABLET: 1 | 90 days supply | Qty: 90 | Fill #2

## 2017-11-25 MED FILL — ROSUVASTATIN CALCIUM 20 MG: 20 | 90 days supply | Qty: 90 | Fill #1

## 2018-01-20 MED FILL — ESTRADIOL 1 MG TABLET: 1 | 90 days supply | Qty: 90 | Fill #3

## 2018-01-22 ENCOUNTER — Other Ambulatory Visit: Payer: Self-pay | Admitting: Obstetrics & Gynecology

## 2018-01-22 DIAGNOSIS — Z1231 Encounter for screening mammogram for malignant neoplasm of breast: Secondary | ICD-10-CM

## 2018-02-10 MED FILL — AMOXICILLIN 500 MG CAPSULE: 500 | 3 days supply | Qty: 12 | Fill #0

## 2018-02-24 MED FILL — ROSUVASTATIN CALCIUM 20 MG: 20 | 90 days supply | Qty: 90 | Fill #2

## 2018-03-11 ENCOUNTER — Ambulatory Visit: Payer: 59

## 2018-03-17 ENCOUNTER — Ambulatory Visit
Admission: RE | Admit: 2018-03-17 | Discharge: 2018-03-17 | Disposition: A | Discharge status: Home / Self Care | Payer: 59 | Source: Ambulatory Visit | Attending: Obstetrics & Gynecology | Admitting: Obstetrics & Gynecology

## 2018-03-17 DIAGNOSIS — Z1231 Encounter for screening mammogram for malignant neoplasm of breast: Secondary | ICD-10-CM

## 2018-03-17 DIAGNOSIS — Z6834 Body mass index (BMI) 34.0-34.9, adult: Secondary | ICD-10-CM | POA: Diagnosis not present

## 2018-03-17 DIAGNOSIS — Z01419 Encounter for gynecological examination (general) (routine) without abnormal findings: Secondary | ICD-10-CM | POA: Diagnosis not present

## 2018-04-16 MED FILL — ESTRADIOL 1 MG TABLET: 1 | 90 days supply | Qty: 90 | Fill #0

## 2018-05-26 MED FILL — ROSUVASTATIN CALCIUM 20 MG: 20 | 90 days supply | Qty: 90 | Fill #3

## 2018-07-21 MED FILL — ESTRADIOL 1 MG TABLET: 1 | 90 days supply | Qty: 90 | Fill #1

## 2018-08-11 DIAGNOSIS — H52221 Regular astigmatism, right eye: Secondary | ICD-10-CM | POA: Diagnosis not present

## 2018-08-11 DIAGNOSIS — H5201 Hypermetropia, right eye: Secondary | ICD-10-CM | POA: Diagnosis not present

## 2018-08-11 DIAGNOSIS — H524 Presbyopia: Secondary | ICD-10-CM | POA: Diagnosis not present

## 2018-08-11 DIAGNOSIS — H5212 Myopia, left eye: Secondary | ICD-10-CM | POA: Diagnosis not present

## 2018-08-11 DIAGNOSIS — H25013 Cortical age-related cataract, bilateral: Secondary | ICD-10-CM | POA: Diagnosis not present

## 2018-08-11 DIAGNOSIS — H04123 Dry eye syndrome of bilateral lacrimal glands: Secondary | ICD-10-CM | POA: Diagnosis not present

## 2018-09-02 ENCOUNTER — Encounter: Payer: Self-pay | Admitting: Internal Medicine

## 2018-09-02 ENCOUNTER — Other Ambulatory Visit (INDEPENDENT_AMBULATORY_CARE_PROVIDER_SITE_OTHER): Payer: 59

## 2018-09-02 ENCOUNTER — Ambulatory Visit (INDEPENDENT_AMBULATORY_CARE_PROVIDER_SITE_OTHER): Payer: 59 | Admitting: Internal Medicine

## 2018-09-02 VITALS — BP 122/78 | HR 62 | Temp 98.3°F | Ht 64.0 in | Wt 190.0 lb

## 2018-09-02 DIAGNOSIS — Z0000 Encounter for general adult medical examination without abnormal findings: Secondary | ICD-10-CM

## 2018-09-02 DIAGNOSIS — R739 Hyperglycemia, unspecified: Secondary | ICD-10-CM

## 2018-09-02 LAB — URINALYSIS, ROUTINE W REFLEX MICROSCOPIC
Bilirubin Urine: NEGATIVE
Hgb urine dipstick: NEGATIVE
Ketones, ur: NEGATIVE
Leukocytes, UA: NEGATIVE
Nitrite: NEGATIVE
RBC / HPF: NONE SEEN
Specific Gravity, Urine: 1.02 (ref 1.000–1.030)
Total Protein, Urine: NEGATIVE
Urine Glucose: NEGATIVE
Urobilinogen, UA: 0.2 (ref 0.0–1.0)
pH: 6 (ref 5.0–8.0)

## 2018-09-02 LAB — CBC WITH DIFFERENTIAL/PLATELET
BASOS PCT: 0.5 % (ref 0.0–3.0)
Basophils Absolute: 0 10*3/uL (ref 0.0–0.1)
EOS PCT: 1.5 % (ref 0.0–5.0)
Eosinophils Absolute: 0.1 10*3/uL (ref 0.0–0.7)
HCT: 40.4 % (ref 36.0–46.0)
Hemoglobin: 13.4 g/dL (ref 12.0–15.0)
LYMPHS ABS: 1.9 10*3/uL (ref 0.7–4.0)
Lymphocytes Relative: 27 % (ref 12.0–46.0)
MCHC: 33.2 g/dL (ref 30.0–36.0)
MCV: 89.2 fl (ref 78.0–100.0)
MONO ABS: 0.5 10*3/uL (ref 0.1–1.0)
MONOS PCT: 6.5 % (ref 3.0–12.0)
NEUTROS ABS: 4.6 10*3/uL (ref 1.4–7.7)
NEUTROS PCT: 64.5 % (ref 43.0–77.0)
PLATELETS: 132 10*3/uL — AB (ref 150.0–400.0)
RBC: 4.53 Mil/uL (ref 3.87–5.11)
RDW: 13.7 % (ref 11.5–15.5)
WBC: 7.1 10*3/uL (ref 4.0–10.5)

## 2018-09-02 LAB — BASIC METABOLIC PANEL
BUN: 13 mg/dL (ref 6–23)
CALCIUM: 10 mg/dL (ref 8.4–10.5)
CO2: 26 meq/L (ref 19–32)
CREATININE: 0.91 mg/dL (ref 0.40–1.20)
Chloride: 105 mEq/L (ref 96–112)
GFR: 82.48 mL/min (ref >60.00)
Glucose, Bld: 96 mg/dL (ref 70–99)
Potassium: 4 mEq/L (ref 3.5–5.1)
Sodium: 140 mEq/L (ref 135–145)

## 2018-09-02 LAB — LIPID PANEL
Cholesterol: 143 mg/dL (ref 0–200)
HDL: 54.1 mg/dL
LDL Cholesterol: 72 mg/dL (ref 0–99)
NonHDL: 88.66
Total CHOL/HDL Ratio: 3
Triglycerides: 84 mg/dL (ref 0.0–149.0)
VLDL: 16.8 mg/dL (ref 0.0–40.0)

## 2018-09-02 LAB — HEPATIC FUNCTION PANEL
ALBUMIN: 4.6 g/dL (ref 3.5–5.2)
ALT: 32 U/L (ref 0–35)
AST: 34 U/L (ref 0–37)
Alkaline Phosphatase: 59 U/L (ref 39–117)
BILIRUBIN TOTAL: 0.5 mg/dL (ref 0.2–1.2)
Bilirubin, Direct: 0.1 mg/dL (ref 0.0–0.3)
Total Protein: 7.5 g/dL (ref 6.0–8.3)

## 2018-09-02 LAB — TSH: TSH: 1.12 u[IU]/mL (ref 0.35–4.50)

## 2018-09-02 LAB — HEMOGLOBIN A1C: HEMOGLOBIN A1C: 6.1 % (ref 4.6–6.5)

## 2018-09-02 MED ORDER — ROSUVASTATIN CALCIUM 20 MG PO TABS: 20.0000 mg | ORAL_TABLET | Freq: Every day | ORAL | 3 refills | Status: DC

## 2018-09-02 MED FILL — ROSUVASTATIN CALCIUM 20 MG: 20 | 90 days supply | Qty: 90 | Fill #0

## 2018-09-02 NOTE — Assessment & Plan Note (Signed)

## 2018-09-02 NOTE — Progress Notes (Signed)
Subjective:    Patient ID: Jillian Andrade, female    DOB: November 28, 1960, 57 y.o.   MRN: [id number]  HPI  Here for wellness and f/u;  Overall doing ok;  Pt denies Chest pain, worsening SOB, DOE, wheezing, orthopnea, PND, worsening LE edema, palpitations, dizziness or syncope.  Pt denies neurological change such as new headache, facial or extremity weakness.  Pt denies polydipsia, polyuria, or low sugar symptoms. Pt states overall good compliance with treatment and medications, good tolerability, and has been trying to follow appropriate diet.  Pt denies worsening depressive symptoms, suicidal ideation or panic. No fever, night sweats, wt loss, loss of appetite, or other constitutional symptoms.  Pt states good ability with ADL's, has low fall risk, home safety reviewed and adequate, no other significant changes in hearing or vision, and only occasionally active with exercise.  No new complaints.  Gets close to 20000 steps per day at work.  Also taking care of her stage 4 lung cancer father who has declined further tx.  No new complaints '= Past Medical History:  Diagnosis Date  . Complication of anesthesia    n/v  . Fibroids in ovaries    pain in left side  . GERD (gastroesophageal reflux disease)    no medications at this time  . Heart murmur    back in the 90s with the mitral valve prolapse  . Insomnia 05/01/2016  . Lumbar disc disease 05/01/2016  . MVP (mitral valve prolapse) mild, no cardiologist  . No pertinent past medical history   . PONV (postoperative nausea and vomiting)    Past Surgical History:  Procedure Laterality Date  . ABDOMINAL HYSTERECTOMY    . ANKLE ARTHROSCOPY  10/31/2011   Procedure: ANKLE ARTHROSCOPY;  Surgeon: Colin Rhein, MD;  Location: [facility];  Service: Orthopedics;  Laterality: Right;  RIGHT ANKLE ARTHROSCOPY WITH EXTENSIVE DEBRIDEMENT  . ANTERIOR CERVICAL DECOMP/DISCECTOMY FUSION N/A 10/04/2016   Procedure: CERVICAL FIVE-CERVICAL SIX   ANTERIOR CERVICAL DECOMPRESSION/DISCECTOMY/FUSION;  Surgeon: Erline Levine, MD;  Location: [facility];  Service: Neurosurgery;  Laterality: N/A;  C5-6 ANTERIOR CERVICAL DECOMPRESSION/DISCECTOMY/FUSION  . COLONOSCOPY    . COLONOSCOPY N/A 03/02/2014   Procedure: COLONOSCOPY;  Surgeon: Cleotis Nipper, MD;  Location: [facility];  Service: Endoscopy;  Laterality: N/A;  ultra slim scope  . DILATION AND CURETTAGE OF UTERUS    . HEMORRHOID SURGERY      reports that she is a non-smoker but has been exposed to tobacco smoke. She has never used smokeless tobacco. She reports that she does not drink alcohol or use drugs. family history includes Glaucoma in her mother; Hypertension in her father; Stroke in her father. Allergies  Allergen Reactions  . Bee Venom Anaphylaxis  . Eggs Or Egg-Derived Products Nausea And Vomiting  . Orange Juice [Orange Oil] Hives and Nausea And Vomiting  . Oxycodone Hives  . Wygesic [Propoxyphene N-Acetaminophen] Nausea And Vomiting   Current Outpatient Medications on File Prior to Visit  Medication Sig Dispense Refill  . Multiple Vitamins-Minerals (ALIVE WOMENS ENERGY) TABS Take 1 tablet by mouth daily.     No current facility-administered medications on file prior to visit.    Review of Systems Constitutional: Negative for other unusual diaphoresis, sweats, appetite or weight changes HENT: Negative for other worsening hearing loss, ear pain, facial swelling, mouth sores or neck stiffness.   Eyes: Negative for other worsening pain, redness or other visual disturbance.  Respiratory: Negative for other stridor or swelling Cardiovascular: Negative for  other palpitations or other chest pain  Gastrointestinal: Negative for worsening diarrhea or loose stools, blood in stool, distention or other pain Genitourinary: Negative for hematuria, flank pain or other change in urine volume.  Musculoskeletal: Negative for myalgias or other joint swelling.  Skin: Negative for other color  change, or other wound or worsening drainage.  Neurological: Negative for other syncope or numbness. Hematological: Negative for other adenopathy or swelling Psychiatric/Behavioral: Negative for hallucinations, other worsening agitation, SI, self-injury, or new decreased concentration All other system neg per pt    Objective:   Physical Exam BP 122/78   Pulse 62   Temp 98.3 F (36.8 C) (Oral)   Ht 5\' 4"  (1.626 m)   Wt 190 lb (86.2 kg)   SpO2 94%   BMI 32.61 kg/m  VS noted,  Constitutional: Pt is oriented to person, place, and time. Appears well-developed and well-nourished, in no significant distress and comfortable Head: Normocephalic and atraumatic  Eyes: Conjunctivae and EOM are normal. Pupils are equal, round, and reactive to light Right Ear: External ear normal without discharge Left Ear: External ear normal without discharge Nose: Nose without discharge or deformity Mouth/Throat: Oropharynx is without other ulcerations and moist  Neck: Normal range of motion. Neck supple. No JVD present. No tracheal deviation present or significant neck LA or mass Cardiovascular: Normal rate, regular rhythm, normal heart sounds and intact distal pulses.   Pulmonary/Chest: WOB normal and breath sounds without rales or wheezing  Abdominal: Soft. Bowel sounds are normal. NT. No HSM  Musculoskeletal: Normal range of motion. Exhibits no edema Lymphadenopathy: Has no other cervical adenopathy.  Neurological: Pt is alert and oriented to person, place, and time. Pt has normal reflexes. No cranial nerve deficit. Motor grossly intact, Gait intact Skin: Skin is warm and dry. No rash noted or new ulcerations Psychiatric:  Has normal mood and affect. Behavior is normal without agitation No other exam findings Lab Results  Component Value Date   WBC 8.5 08/26/2017   HGB 13.5 08/26/2017   HCT 40.9 08/26/2017   PLT 154.0 08/26/2017   GLUCOSE 96 08/26/2017   CHOL 224 (H) 08/26/2017   TRIG 97.0  08/26/2017   HDL 57.60 08/26/2017   LDLCALC 147 (H) 08/26/2017   ALT 20 08/26/2017   AST 22 08/26/2017   NA 142 08/26/2017   K 3.8 08/26/2017   CL 105 08/26/2017   CREATININE 0.96 08/26/2017   BUN 14 08/26/2017   CO2 28 08/26/2017   TSH 0.63 08/26/2017        Assessment & Plan:

## 2018-09-02 NOTE — Patient Instructions (Signed)

## 2018-09-02 NOTE — Assessment & Plan Note (Signed)
stable overall by history and exam, recent data reviewed with pt, and pt to continue medical treatment as before,  to f/u any worsening symptoms or concerns  

## 2018-10-24 MED FILL — ESTRADIOL 1 MG TABLET: 1 | 90 days supply | Qty: 90 | Fill #2

## 2018-12-15 MED FILL — ROSUVASTATIN CALCIUM 20 MG: 20 | 90 days supply | Qty: 90 | Fill #1

## 2019-01-28 MED FILL — ESTRADIOL 1 MG TABS: 1 | 90 days supply | Qty: 90 | Fill #0 | Status: TO

## 2019-03-06 ENCOUNTER — Encounter: Payer: Self-pay | Admitting: Internal Medicine

## 2019-03-06 ENCOUNTER — Ambulatory Visit (INDEPENDENT_AMBULATORY_CARE_PROVIDER_SITE_OTHER): Payer: 59 | Admitting: Internal Medicine

## 2019-03-06 DIAGNOSIS — R739 Hyperglycemia, unspecified: Secondary | ICD-10-CM | POA: Diagnosis not present

## 2019-03-06 DIAGNOSIS — E559 Vitamin D deficiency, unspecified: Secondary | ICD-10-CM

## 2019-03-06 DIAGNOSIS — J309 Allergic rhinitis, unspecified: Secondary | ICD-10-CM

## 2019-03-06 DIAGNOSIS — M159 Polyosteoarthritis, unspecified: Secondary | ICD-10-CM | POA: Diagnosis not present

## 2019-03-06 DIAGNOSIS — Z0000 Encounter for general adult medical examination without abnormal findings: Secondary | ICD-10-CM

## 2019-03-06 DIAGNOSIS — E785 Hyperlipidemia, unspecified: Secondary | ICD-10-CM | POA: Insufficient documentation

## 2019-03-06 DIAGNOSIS — E611 Iron deficiency: Secondary | ICD-10-CM | POA: Diagnosis not present

## 2019-03-06 DIAGNOSIS — M869 Osteomyelitis, unspecified: Secondary | ICD-10-CM

## 2019-03-06 DIAGNOSIS — E538 Deficiency of other specified B group vitamins: Secondary | ICD-10-CM | POA: Diagnosis not present

## 2019-03-06 HISTORY — DX: Osteomyelitis, unspecified: M86.9

## 2019-03-06 HISTORY — DX: Allergic rhinitis, unspecified: J30.9

## 2019-03-06 MED ORDER — DICLOFENAC SODIUM 1 % TD GEL: 2.0000 g | Freq: Four times a day (QID) | TRANSDERMAL | 11 refills | Status: DC | PRN

## 2019-03-06 MED ORDER — ESTRADIOL 1 MG PO TABS: 1.0000 mg | ORAL_TABLET | Freq: Every day | ORAL | 3 refills | Status: DC

## 2019-03-06 NOTE — Patient Instructions (Addendum)
Please take all new medication as prescribed - the Voltaren gel as needed for hand pain  Please continue all other medications as before, and refills have been done if requested.  Please have the pharmacy call with any other refills you may need.  Please continue your efforts at being more active, low cholesterol diet, and weight control  Please keep your appointments with your specialists as you may have planned  Please return in 6 months, or sooner if needed, with Lab testing done 3-5 days before

## 2019-03-06 NOTE — Progress Notes (Signed)
Patient ID: Jillian Andrade, female   DOB: October 24, 1960, 58 y.o.   MRN: [id number]  Virtual Visit via Video Note  I connected with Jillian Andrade on 03/06/19 at  9:20 AM EDT by a video enabled telemedicine application and verified that I am speaking with the correct person using two identifiers.  Location: Patient: at home Provider: at office   I discussed the limitations of evaluation and management by telemedicine and the availability of in person appointments. The patient expressed understanding and agreed to proceed.  History of Present Illness: Here to f/u; overall doing ok,  Pt denies chest pain, increasing sob or doe, wheezing, orthopnea, PND, increased LE swelling, palpitations, dizziness or syncope.  Pt denies new neurological symptoms such as new headache, or facial or extremity weakness or numbness.  Pt denies polydipsia, polyuria, or low sugar episode.  Pt states overall good compliance with meds, mostly trying to follow appropriate diet, with wt overall stable,  but little exercise however.  Does have several wks ongoing nasal allergy symptoms with clearish congestion, itch and sneezing, without fever, pain, ST, cough, swelling or wheezing.  Also c/o bilat hand OA pain especially to most of her dips and pip to multiple fingers Past Medical History:  Diagnosis Date  . Allergic rhinitis 03/06/2019  . Complication of anesthesia    n/v  . Fibroids in ovaries    pain in left side  . GERD (gastroesophageal reflux disease)    no medications at this time  . Hand osteomyelitis ([place]) 03/06/2019  . Heart murmur    back in the 90s with the mitral valve prolapse  . Insomnia 05/01/2016  . Lumbar disc disease 05/01/2016  . MVP (mitral valve prolapse) mild, no cardiologist  . No pertinent past medical history   . PONV (postoperative nausea and vomiting)    Past Surgical History:  Procedure Laterality Date  . ABDOMINAL HYSTERECTOMY    . ANKLE ARTHROSCOPY  10/31/2011   Procedure: ANKLE  ARTHROSCOPY;  Surgeon: Colin Rhein, MD;  Location: [place];  Service: Orthopedics;  Laterality: Right;  RIGHT ANKLE ARTHROSCOPY WITH EXTENSIVE DEBRIDEMENT  . ANTERIOR CERVICAL DECOMP/DISCECTOMY FUSION N/A 10/04/2016   Procedure: CERVICAL FIVE-CERVICAL SIX  ANTERIOR CERVICAL DECOMPRESSION/DISCECTOMY/FUSION;  Surgeon: Erline Levine, MD;  Location: [place];  Service: Neurosurgery;  Laterality: N/A;  C5-6 ANTERIOR CERVICAL DECOMPRESSION/DISCECTOMY/FUSION  . COLONOSCOPY    . COLONOSCOPY N/A 03/02/2014   Procedure: COLONOSCOPY;  Surgeon: Cleotis Nipper, MD;  Location: [facility];  Service: Endoscopy;  Laterality: N/A;  ultra slim scope  . DILATION AND CURETTAGE OF UTERUS    . HEMORRHOID SURGERY      reports that she is a non-smoker but has been exposed to tobacco smoke. She has never used smokeless tobacco. She reports that she does not drink alcohol or use drugs. family history includes Glaucoma in her mother; Hypertension in her father; Stroke in her father. Allergies  Allergen Reactions  . Bee Venom Anaphylaxis  . Eggs Or Egg-Derived Products Nausea And Vomiting  . Orange Juice [Orange Oil] Hives and Nausea And Vomiting  . Oxycodone Hives  . Wygesic [Propoxyphene N-Acetaminophen] Nausea And Vomiting   Current Outpatient Medications on File Prior to Visit  Medication Sig Dispense Refill  . Multiple Vitamins-Minerals (ALIVE WOMENS ENERGY) TABS Take 1 tablet by mouth daily.    . rosuvastatin (CRESTOR) 20 MG tablet Take 1 tablet (20 mg total) by mouth daily. 90 tablet 3   No current facility-administered medications on file prior  to visit.     Observations/Objective: Alert, NAD, appropriate mood and affect, resps normal, cn 2-12 intact, moves all 4s, no visible rash or swelling; fingers with large OA changes to multiple DIP and PIP Lab Results  Component Value Date   WBC 7.1 09/02/2018   HGB 13.4 09/02/2018   HCT 40.4 09/02/2018   PLT 132.0 (L) 09/02/2018   GLUCOSE 96  09/02/2018   CHOL 143 09/02/2018   TRIG 84.0 09/02/2018   HDL 54.10 09/02/2018   LDLCALC 72 09/02/2018   ALT 32 09/02/2018   AST 34 09/02/2018   NA 140 09/02/2018   K 4.0 09/02/2018   CL 105 09/02/2018   CREATININE 0.91 09/02/2018   BUN 13 09/02/2018   CO2 26 09/02/2018   TSH 1.12 09/02/2018   HGBA1C 6.1 09/02/2018   Assessment and Plan: See notes  Follow Up Instructions: See notes   I discussed the assessment and treatment plan with the patient. The patient was provided an opportunity to ask questions and all were answered. The patient agreed with the plan and demonstrated an understanding of the instructions.   The patient was advised to call back or seek an in-person evaluation if the symptoms worsen or if the condition fails to improve as anticipated.   Cathlean Cower, MD

## 2019-03-07 ENCOUNTER — Encounter: Payer: Self-pay | Admitting: Internal Medicine

## 2019-03-07 NOTE — Assessment & Plan Note (Signed)
Ok for voltaren gel prn,  to f/u any worsening symptoms or concerns °

## 2019-03-07 NOTE — Assessment & Plan Note (Signed)
stable overall by history and exam, recent data reviewed with pt, and pt to continue medical treatment as before,  to f/u any worsening symptoms or concerns, for a1c with next labs

## 2019-03-07 NOTE — Assessment & Plan Note (Signed)
stable overall by history and exam, recent data reviewed with pt, and pt to continue medical treatment as before,  to f/u any worsening symptoms or concerns, for lipids with labs 

## 2019-03-07 NOTE — Assessment & Plan Note (Signed)
D/w pt, mild, for zyrtec 10 qd prn,  to f/u any worsening symptoms or concerns

## 2019-03-10 MED FILL — ROSUVASTATIN CALCIUM 20 MG: 20 | 90 days supply | Qty: 90 | Fill #2

## 2019-03-13 ENCOUNTER — Other Ambulatory Visit (INDEPENDENT_AMBULATORY_CARE_PROVIDER_SITE_OTHER): Payer: 59

## 2019-03-13 DIAGNOSIS — E538 Deficiency of other specified B group vitamins: Secondary | ICD-10-CM

## 2019-03-13 DIAGNOSIS — E611 Iron deficiency: Secondary | ICD-10-CM

## 2019-03-13 DIAGNOSIS — Z0000 Encounter for general adult medical examination without abnormal findings: Secondary | ICD-10-CM

## 2019-03-13 DIAGNOSIS — E559 Vitamin D deficiency, unspecified: Secondary | ICD-10-CM

## 2019-03-13 DIAGNOSIS — R739 Hyperglycemia, unspecified: Secondary | ICD-10-CM | POA: Diagnosis not present

## 2019-03-13 LAB — URINALYSIS, ROUTINE W REFLEX MICROSCOPIC
Bilirubin Urine: NEGATIVE
Hgb urine dipstick: NEGATIVE
Ketones, ur: NEGATIVE
Leukocytes,Ua: NEGATIVE
Nitrite: NEGATIVE
RBC / HPF: NONE SEEN (ref 0–?)
Specific Gravity, Urine: 1.025 (ref 1.000–1.030)
Total Protein, Urine: NEGATIVE
Urine Glucose: NEGATIVE
Urobilinogen, UA: 0.2 (ref 0.0–1.0)
pH: 5.5 (ref 5.0–8.0)

## 2019-03-13 LAB — CBC WITH DIFFERENTIAL/PLATELET
Basophils Absolute: 0.1 10*3/uL (ref 0.0–0.1)
Basophils Relative: 0.7 % (ref 0.0–3.0)
Eosinophils Absolute: 0.1 10*3/uL (ref 0.0–0.7)
Eosinophils Relative: 1.8 % (ref 0.0–5.0)
HCT: 38.8 % (ref 36.0–46.0)
Hemoglobin: 13.4 g/dL (ref 12.0–15.0)
Lymphocytes Relative: 27.1 % (ref 12.0–46.0)
Lymphs Abs: 2 10*3/uL (ref 0.7–4.0)
MCHC: 34.6 g/dL (ref 30.0–36.0)
MCV: 88.5 fl (ref 78.0–100.0)
Monocytes Absolute: 0.5 10*3/uL (ref 0.1–1.0)
Monocytes Relative: 6.2 % (ref 3.0–12.0)
Neutro Abs: 4.8 10*3/uL (ref 1.4–7.7)
Neutrophils Relative %: 64.2 % (ref 43.0–77.0)
Platelets: 127 10*3/uL — ABNORMAL LOW (ref 150.0–400.0)
RBC: 4.39 Mil/uL (ref 3.87–5.11)
RDW: 13.7 % (ref 11.5–15.5)
WBC: 7.5 10*3/uL (ref 4.0–10.5)

## 2019-03-13 LAB — HEPATIC FUNCTION PANEL
ALT: 24 U/L (ref 0–35)
AST: 27 U/L (ref 0–37)
Albumin: 4.3 g/dL (ref 3.5–5.2)
Alkaline Phosphatase: 50 U/L (ref 39–117)
Bilirubin, Direct: 0.1 mg/dL (ref 0.0–0.3)
Total Bilirubin: 0.5 mg/dL (ref 0.2–1.2)
Total Protein: 7.1 g/dL (ref 6.0–8.3)

## 2019-03-13 LAB — TSH: TSH: 1.33 u[IU]/mL (ref 0.35–4.50)

## 2019-03-13 LAB — IBC PANEL
Iron: 56 ug/dL (ref 42–145)
Saturation Ratios: 20.6 % (ref 20.0–50.0)
Transferrin: 194 mg/dL — ABNORMAL LOW (ref 212.0–360.0)

## 2019-03-13 LAB — LIPID PANEL
Cholesterol: 118 mg/dL (ref 0–200)
HDL: 56 mg/dL (ref >39.00)
LDL Cholesterol: 51 mg/dL (ref 0–99)
NonHDL: 62.25
Total CHOL/HDL Ratio: 2
Triglycerides: 54 mg/dL (ref 0.0–149.0)
VLDL: 10.8 mg/dL (ref 0.0–40.0)

## 2019-03-13 LAB — BASIC METABOLIC PANEL
BUN: 14 mg/dL (ref 6–23)
CO2: 24 mEq/L (ref 19–32)
Calcium: 9 mg/dL (ref 8.4–10.5)
Chloride: 109 mEq/L (ref 96–112)
Creatinine, Ser: 0.99 mg/dL (ref 0.40–1.20)
GFR: 70.27 mL/min (ref >60.00)
Glucose, Bld: 97 mg/dL (ref 70–99)
Potassium: 3.9 mEq/L (ref 3.5–5.1)
Sodium: 142 mEq/L (ref 135–145)

## 2019-03-13 LAB — VITAMIN D 25 HYDROXY (VIT D DEFICIENCY, FRACTURES): VITD: 46.53 ng/mL (ref 30.00–100.00)

## 2019-03-13 LAB — VITAMIN B12: Vitamin B-12: 1073 pg/mL — ABNORMAL HIGH (ref 211–911)

## 2019-03-13 LAB — HEMOGLOBIN A1C: Hgb A1c MFr Bld: 6.1 % (ref 4.6–6.5)

## 2019-03-18 MED FILL — DICLOFENAC SODIUM 1% GEL: 1 | 25 days supply | Qty: 200 | Fill #0

## 2019-04-24 ENCOUNTER — Other Ambulatory Visit: Payer: Self-pay | Admitting: Obstetrics & Gynecology

## 2019-04-24 DIAGNOSIS — Z1231 Encounter for screening mammogram for malignant neoplasm of breast: Secondary | ICD-10-CM

## 2019-05-11 MED FILL — ESTRADIOL 1 MG TABLET: 1 | 30 days supply | Qty: 30 | Fill #0

## 2019-05-11 MED FILL — AMOXICILLIN 500 MG CAPSULE: 500 | 3 days supply | Qty: 12 | Fill #0

## 2019-05-23 ENCOUNTER — Ambulatory Visit (INDEPENDENT_AMBULATORY_CARE_PROVIDER_SITE_OTHER)
Admission: RE | Admit: 2019-05-23 | Discharge: 2019-05-23 | Disposition: A | Discharge status: Home / Self Care | Payer: 59 | Source: Ambulatory Visit

## 2019-05-23 DIAGNOSIS — K219 Gastro-esophageal reflux disease without esophagitis: Secondary | ICD-10-CM

## 2019-05-23 DIAGNOSIS — R682 Dry mouth, unspecified: Secondary | ICD-10-CM | POA: Diagnosis not present

## 2019-05-23 MED ORDER — OMEPRAZOLE 40 MG PO CPDR: 40.0000 mg | DELAYED_RELEASE_CAPSULE | Freq: Every day | ORAL | 0 refills | Status: DC

## 2019-05-23 NOTE — Discharge Instructions (Signed)
Please see provided information about diet and GERD.  Please start daily omeprazole, use regularly, will see if this is helpful with symptoms.  Biotene products for dry mouth may be helpful  If no improvement or persistent symptoms please follow up with your primary care provider as you may need further evaluation or referral.

## 2019-05-23 NOTE — ED Provider Notes (Signed)
Virtual Visit via Video Note:  [name]  initiated request for Telemedicine visit with [facility] team. I connected with Jillian Andrade  on 05/23/2019 at 10:57 AM  for a synchronized telemedicine visit using a video enabled HIPPA compliant telemedicine application. I verified that I am speaking with Jillian Andrade  using two identifiers. Zigmund Gottron, NP  was physically located in a [facility]gent care site and Jillian Andrade was located at a different location.   The limitations of evaluation and management by telemedicine as well as the availability of in-person appointments were discussed. Patient was informed that she  may incur a bill ( including co-pay) for this virtual visit encounter. Jillian Andrade  expressed understanding and gave verbal consent to proceed with virtual visit.     History of Present Illness:Jillian Andrade  is a 58 y.o. female presents with complaints of throat irritation. Had anterior cervical surgery two years ago. No cough, no fever. No pain to throat. Sensation that something is "stuck" in her throat. "ball of cotton" sensation in her throat like it is dry. Has been off of work for the past week due to family obligations. Feels better when she is outside. Use of the [facility] at her home seems to worsen it, therefore she has since closed the vents in her bedroom. At night it feels worse. Has felt this sensation over a week. Denies any previous similar. No necessarily worse after eating. She does experience acid reflux. She does have some burning to her throat at times even. Hasn't taken any medication for gerd. She works in an [employer] and briefly spoke with ENT who did recommend prilosec, she has not tried this. No vomiting or choking. No nasal congestion. No post nasal drip. History  Of allergic rhinitis, gerd, murmur, MVP.   Past Medical History:  Diagnosis Date  . Allergic rhinitis 03/06/2019  . Complication of anesthesia    n/v  . Fibroids  in ovaries    pain in left side  . GERD (gastroesophageal reflux disease)    no medications at this time  . Hand osteomyelitis ([place]) 03/06/2019  . Heart murmur    back in the 90s with the mitral valve prolapse  . Insomnia 05/01/2016  . Lumbar disc disease 05/01/2016  . MVP (mitral valve prolapse) mild, no cardiologist  . No pertinent past medical history   . PONV (postoperative nausea and vomiting)     Allergies  Allergen Reactions  . Bee Venom Anaphylaxis  . Eggs Or Egg-Derived Products Nausea And Vomiting  . Orange Juice [Orange Oil] Hives and Nausea And Vomiting  . Oxycodone Hives  . Wygesic [Propoxyphene N-Acetaminophen] Nausea And Vomiting        Observations/Objective: Alert, oriented, non toxic in appearance. Clear coherent speech without difficulty. No increased work of breathing visualized.  Swallowing without difficulty and managing secretions  Assessment and Plan: gerd vs post nasal drip vs esophagitis vs stricture considered. Will start with omeprazole. Follow up with PCP and/or ENT/Gi for persistent symptoms. Patient verbalized understanding and agreeable to plan.   Follow Up Instructions:    I discussed the assessment and treatment plan with the patient. The patient was provided an opportunity to ask questions and all were answered. The patient agreed with the plan and demonstrated an understanding of the instructions.   The patient was advised to call back or seek an in-person evaluation if the symptoms worsen or if the condition fails to  improve as anticipated.  I provided 15 minutes of non-face-to-face time during this encounter.    Zigmund Gottron, NP  05/23/2019 10:57 AM         Zigmund Gottron, NP 05/23/19 [id number]

## 2019-05-25 MED FILL — OMEPRAZOLE DR 40 MG CAPSULE: 40 | 30 days supply | Qty: 30 | Fill #0

## 2019-06-01 DIAGNOSIS — Z01419 Encounter for gynecological examination (general) (routine) without abnormal findings: Secondary | ICD-10-CM | POA: Diagnosis not present

## 2019-06-01 DIAGNOSIS — Z6831 Body mass index (BMI) 31.0-31.9, adult: Secondary | ICD-10-CM | POA: Diagnosis not present

## 2019-06-05 ENCOUNTER — Ambulatory Visit
Admission: RE | Admit: 2019-06-05 | Discharge: 2019-06-05 | Disposition: A | Discharge status: Home / Self Care | Payer: 59 | Source: Ambulatory Visit | Attending: Obstetrics & Gynecology | Admitting: Obstetrics & Gynecology

## 2019-06-05 ENCOUNTER — Ambulatory Visit (INDEPENDENT_AMBULATORY_CARE_PROVIDER_SITE_OTHER): Payer: 59 | Admitting: Internal Medicine

## 2019-06-05 ENCOUNTER — Other Ambulatory Visit: Payer: Self-pay

## 2019-06-05 ENCOUNTER — Encounter: Payer: Self-pay | Admitting: Internal Medicine

## 2019-06-05 DIAGNOSIS — J309 Allergic rhinitis, unspecified: Secondary | ICD-10-CM | POA: Diagnosis not present

## 2019-06-05 DIAGNOSIS — K219 Gastro-esophageal reflux disease without esophagitis: Secondary | ICD-10-CM | POA: Diagnosis not present

## 2019-06-05 DIAGNOSIS — Z1231 Encounter for screening mammogram for malignant neoplasm of breast: Secondary | ICD-10-CM

## 2019-06-05 DIAGNOSIS — R1310 Dysphagia, unspecified: Secondary | ICD-10-CM | POA: Diagnosis not present

## 2019-06-05 MED ORDER — TRIAMCINOLONE ACETONIDE 55 MCG/ACT NA AERO: 2.0000 | INHALATION_SPRAY | Freq: Every day | NASAL | 12 refills | Status: DC

## 2019-06-05 MED ORDER — CETIRIZINE HCL 10 MG PO TABS: 10.0000 mg | ORAL_TABLET | Freq: Every day | ORAL | 11 refills | Status: DC

## 2019-06-05 MED FILL — ESTRADIOL 2 MG TABS: 2 | 90 days supply | Qty: 90 | Fill #0

## 2019-06-05 MED FILL — ROSUVASTATIN CALCIUM 20 MG: 20 | 90 days supply | Qty: 90 | Fill #3

## 2019-06-05 NOTE — Progress Notes (Deleted)
   Subjective:    Patient ID: BRONX XING, female    DOB: 10-14-1961, 58 y.o.   MRN: [id number]  HPI    Review of Systems     Objective:   Physical Exam        Assessment & Plan:

## 2019-06-05 NOTE — Progress Notes (Signed)
Patient ID: Jillian Andrade, female   DOB: 1961-06-07, 58 y.o.   MRN: [id number]  Virtual Visit via Video Note  I connected with [name] on 06/05/19 at  9:00 AM EDT by a video enabled telemedicine application and verified that I am speaking with the correct person using two identifiers.  Location: Patient: at home Provider: at office   I discussed the limitations of evaluation and management by telemedicine and the availability of in person appointments. The patient expressed understanding and agreed to proceed.  History of Present Illness: Here to f/u c/o throat iriration for several wks without fever, and now not improved with prilosec after recent trial of 40 mg daily.  Does have several wks ongoing nasal allergy symptoms with clearish congestion, itch and sneezing, without fever, pain, ST, cough, swelling or wheezing.  Denies worsening reflux, abd pain, n/v, bowel change or blood, but does have a sense of dysphagia with solids sticking at the lower sternal area without vomiting or wt loss.   Pt denies fever, wt loss, night sweats, loss of appetite, or other constitutional symptoms   Past Medical History:  Diagnosis Date  . Allergic rhinitis 03/06/2019  . Complication of anesthesia    n/v  . Fibroids in ovaries    pain in left side  . GERD (gastroesophageal reflux disease)    no medications at this time  . Hand osteomyelitis ([place]) 03/06/2019  . Heart murmur    back in the 90s with the mitral valve prolapse  . Insomnia 05/01/2016  . Lumbar disc disease 05/01/2016  . MVP (mitral valve prolapse) mild, no cardiologist  . No pertinent past medical history   . PONV (postoperative nausea and vomiting)    Past Surgical History:  Procedure Laterality Date  . ABDOMINAL HYSTERECTOMY    . ANKLE ARTHROSCOPY  10/31/2011   Procedure: ANKLE ARTHROSCOPY;  Surgeon: Colin Rhein, MD;  Location: [place];  Service: Orthopedics;  Laterality: Right;  RIGHT ANKLE ARTHROSCOPY  WITH EXTENSIVE DEBRIDEMENT  . ANTERIOR CERVICAL DECOMP/DISCECTOMY FUSION N/A 10/04/2016   Procedure: CERVICAL FIVE-CERVICAL SIX  ANTERIOR CERVICAL DECOMPRESSION/DISCECTOMY/FUSION;  Surgeon: Erline Levine, MD;  Location: [place];  Service: Neurosurgery;  Laterality: N/A;  C5-6 ANTERIOR CERVICAL DECOMPRESSION/DISCECTOMY/FUSION  . COLONOSCOPY    . COLONOSCOPY N/A 03/02/2014   Procedure: COLONOSCOPY;  Surgeon: Cleotis Nipper, MD;  [facility];  Service: Endoscopy;  Laterality: N/A;  ultra slim scope  . DILATION AND CURETTAGE OF UTERUS    . HEMORRHOID SURGERY      reports that she is a non-smoker but has been exposed to tobacco smoke. She has never used smokeless tobacco. She reports that she does not drink alcohol or use drugs. family history includes Glaucoma in her mother; Hypertension in her father; Stroke in her father. Allergies  Allergen Reactions  . Bee Venom Anaphylaxis  . Eggs Or Egg-Derived Products Nausea And Vomiting  . Orange Juice [Orange Oil] Hives and Nausea And Vomiting  . Oxycodone Hives  . Wygesic [Propoxyphene N-Acetaminophen] Nausea And Vomiting   Current Outpatient Medications on File Prior to Visit  Medication Sig Dispense Refill  . diclofenac sodium (VOLTAREN) 1 % GEL Apply 2 g topically 4 (four) times daily as needed. 200 g 11  . estradiol (ESTRACE) 1 MG tablet Take 1 tablet (1 mg total) by mouth daily. 90 tablet 3  . Multiple Vitamins-Minerals (ALIVE WOMENS ENERGY) TABS Take 1 tablet by mouth daily.    Marland Kitchen omeprazole (PRILOSEC) 40 MG capsule  Take 1 capsule (40 mg total) by mouth daily. 30 capsule 0  . rosuvastatin (CRESTOR) 20 MG tablet Take 1 tablet (20 mg total) by mouth daily. 90 tablet 3   No current facility-administered medications on file prior to visit.     Observations/Objective: Alert, NAD, appropriate mood and affect, resps normal, cn 2-12 intact, moves all 4s, no visible rash or swelling Lab Results  Component Value Date   WBC 7.5 03/13/2019    HGB 13.4 03/13/2019   HCT 38.8 03/13/2019   PLT 127.0 (L) 03/13/2019   GLUCOSE 97 03/13/2019   CHOL 118 03/13/2019   TRIG 54.0 03/13/2019   HDL 56.00 03/13/2019   LDLCALC 51 03/13/2019   ALT 24 03/13/2019   AST 27 03/13/2019   NA 142 03/13/2019   K 3.9 03/13/2019   CL 109 03/13/2019   CREATININE 0.99 03/13/2019   BUN 14 03/13/2019   CO2 24 03/13/2019   TSH 1.33 03/13/2019   HGBA1C 6.1 03/13/2019   Assessment and Plan: See notes  Follow Up Instructions: See notes   I discussed the assessment and treatment plan with the patient. The patient was provided an opportunity to ask questions and all were answered. The patient agreed with the plan and demonstrated an understanding of the instructions.   The patient was advised to call back or seek an in-person evaluation if the symptoms worsen or if the condition fails to improve as anticipated.   Cathlean Cower, MD

## 2019-06-05 NOTE — Assessment & Plan Note (Signed)
?   Etiology, ok for GI referral, ? Need endoscopy

## 2019-06-05 NOTE — Assessment & Plan Note (Signed)
Mild to mod, for zyrtec and nasacort asd,  to f/u any worsening symptoms or concerns 

## 2019-06-05 NOTE — Assessment & Plan Note (Signed)
Ok to continue the prilosec 40 qd

## 2019-06-05 NOTE — Patient Instructions (Signed)
Please take all new medication as prescribed - the zyrtec and nasacort  Ok to continue the prilosec  Please continue all other medications as before, and refills have been done if requested.  Please have the pharmacy call with any other refills you may need.  Please continue your efforts at being more active, low cholesterol diet, and weight control.  You are otherwise up to date with prevention measures today.  Please keep your appointments with your specialists as you may have planned

## 2019-06-15 DIAGNOSIS — K219 Gastro-esophageal reflux disease without esophagitis: Secondary | ICD-10-CM | POA: Diagnosis not present

## 2019-06-15 DIAGNOSIS — R1313 Dysphagia, pharyngeal phase: Secondary | ICD-10-CM | POA: Diagnosis not present

## 2019-06-15 DIAGNOSIS — R0989 Other specified symptoms and signs involving the circulatory and respiratory systems: Secondary | ICD-10-CM | POA: Diagnosis not present

## 2019-06-15 MED FILL — PANTOPRAZOLE SOD DR 40 MG T: 40 | 30 days supply | Qty: 30 | Fill #0

## 2019-06-17 ENCOUNTER — Other Ambulatory Visit: Payer: Self-pay | Admitting: Physician Assistant

## 2019-06-17 DIAGNOSIS — R1313 Dysphagia, pharyngeal phase: Secondary | ICD-10-CM

## 2019-06-26 ENCOUNTER — Ambulatory Visit
Admission: RE | Admit: 2019-06-26 | Discharge: 2019-06-26 | Disposition: A | Discharge status: Home / Self Care | Payer: 59 | Source: Ambulatory Visit | Attending: Physician Assistant | Admitting: Physician Assistant

## 2019-06-26 DIAGNOSIS — R1313 Dysphagia, pharyngeal phase: Secondary | ICD-10-CM

## 2019-07-13 DIAGNOSIS — R0989 Other specified symptoms and signs involving the circulatory and respiratory systems: Secondary | ICD-10-CM | POA: Diagnosis not present

## 2019-07-13 DIAGNOSIS — K219 Gastro-esophageal reflux disease without esophagitis: Secondary | ICD-10-CM | POA: Diagnosis not present

## 2019-07-13 MED FILL — PANTOPRAZOLE SOD DR 40 MG T: 40 | 30 days supply | Qty: 60 | Fill #0

## 2019-08-14 MED FILL — PANTOPRAZOLE SOD DR 40 MG T: 40 | 30 days supply | Qty: 60 | Fill #1

## 2019-08-31 ENCOUNTER — Other Ambulatory Visit (INDEPENDENT_AMBULATORY_CARE_PROVIDER_SITE_OTHER): Payer: 59

## 2019-08-31 DIAGNOSIS — R739 Hyperglycemia, unspecified: Secondary | ICD-10-CM | POA: Diagnosis not present

## 2019-08-31 DIAGNOSIS — Z0000 Encounter for general adult medical examination without abnormal findings: Secondary | ICD-10-CM | POA: Diagnosis not present

## 2019-08-31 DIAGNOSIS — R1313 Dysphagia, pharyngeal phase: Secondary | ICD-10-CM | POA: Diagnosis not present

## 2019-08-31 DIAGNOSIS — R0989 Other specified symptoms and signs involving the circulatory and respiratory systems: Secondary | ICD-10-CM | POA: Diagnosis not present

## 2019-08-31 DIAGNOSIS — K219 Gastro-esophageal reflux disease without esophagitis: Secondary | ICD-10-CM | POA: Diagnosis not present

## 2019-08-31 LAB — URINALYSIS, ROUTINE W REFLEX MICROSCOPIC
Bilirubin Urine: NEGATIVE
Hgb urine dipstick: NEGATIVE
Ketones, ur: NEGATIVE
Leukocytes,Ua: NEGATIVE
Nitrite: NEGATIVE
RBC / HPF: NONE SEEN (ref 0–?)
Specific Gravity, Urine: 1.02 (ref 1.000–1.030)
Total Protein, Urine: NEGATIVE
Urine Glucose: NEGATIVE
Urobilinogen, UA: 0.2 (ref 0.0–1.0)
pH: 6 (ref 5.0–8.0)

## 2019-08-31 LAB — CBC WITH DIFFERENTIAL/PLATELET
Basophils Absolute: 0 10*3/uL (ref 0.0–0.1)
Basophils Relative: 0.4 % (ref 0.0–3.0)
Eosinophils Absolute: 0.1 10*3/uL (ref 0.0–0.7)
Eosinophils Relative: 1.4 % (ref 0.0–5.0)
HCT: 41.8 % (ref 36.0–46.0)
Hemoglobin: 13.7 g/dL (ref 12.0–15.0)
Lymphocytes Relative: 29.5 % (ref 12.0–46.0)
Lymphs Abs: 2.1 10*3/uL (ref 0.7–4.0)
MCHC: 32.8 g/dL (ref 30.0–36.0)
MCV: 90.6 fl (ref 78.0–100.0)
Monocytes Absolute: 0.4 10*3/uL (ref 0.1–1.0)
Monocytes Relative: 6.3 % (ref 3.0–12.0)
Neutro Abs: 4.5 10*3/uL (ref 1.4–7.7)
Neutrophils Relative %: 62.4 % (ref 43.0–77.0)
Platelets: 107 10*3/uL — ABNORMAL LOW (ref 150.0–400.0)
RBC: 4.61 Mil/uL (ref 3.87–5.11)
RDW: 13.8 % (ref 11.5–15.5)
WBC: 7.2 10*3/uL (ref 4.0–10.5)

## 2019-08-31 LAB — HEPATIC FUNCTION PANEL
ALT: 23 U/L (ref 0–35)
AST: 24 U/L (ref 0–37)
Albumin: 4.7 g/dL (ref 3.5–5.2)
Alkaline Phosphatase: 55 U/L (ref 39–117)
Bilirubin, Direct: 0 mg/dL (ref 0.0–0.3)
Total Bilirubin: 0.5 mg/dL (ref 0.2–1.2)
Total Protein: 7.7 g/dL (ref 6.0–8.3)

## 2019-08-31 LAB — BASIC METABOLIC PANEL
BUN: 12 mg/dL (ref 6–23)
CO2: 27 mEq/L (ref 19–32)
Calcium: 9.7 mg/dL (ref 8.4–10.5)
Chloride: 103 mEq/L (ref 96–112)
Creatinine, Ser: 0.97 mg/dL (ref 0.40–1.20)
GFR: 71.83 mL/min (ref >60.00)
Glucose, Bld: 91 mg/dL (ref 70–99)
Potassium: 4.3 mEq/L (ref 3.5–5.1)
Sodium: 138 mEq/L (ref 135–145)

## 2019-08-31 LAB — LIPID PANEL
Cholesterol: 137 mg/dL (ref 0–200)
HDL: 60.3 mg/dL (ref >39.00)
LDL Cholesterol: 64 mg/dL (ref 0–99)
NonHDL: 77.01
Total CHOL/HDL Ratio: 2
Triglycerides: 67 mg/dL (ref 0.0–149.0)
VLDL: 13.4 mg/dL (ref 0.0–40.0)

## 2019-08-31 LAB — TSH: TSH: 1.17 u[IU]/mL (ref 0.35–4.50)

## 2019-08-31 LAB — HEMOGLOBIN A1C: Hgb A1c MFr Bld: 6 % (ref 4.6–6.5)

## 2019-09-04 ENCOUNTER — Other Ambulatory Visit: Payer: Self-pay

## 2019-09-04 ENCOUNTER — Encounter: Payer: Self-pay | Admitting: Internal Medicine

## 2019-09-04 ENCOUNTER — Ambulatory Visit (INDEPENDENT_AMBULATORY_CARE_PROVIDER_SITE_OTHER): Payer: 59 | Admitting: Internal Medicine

## 2019-09-04 VITALS — BP 116/76 | HR 72 | Temp 98.7°F | Ht 64.0 in | Wt 181.0 lb

## 2019-09-04 DIAGNOSIS — D696 Thrombocytopenia, unspecified: Secondary | ICD-10-CM | POA: Diagnosis not present

## 2019-09-04 DIAGNOSIS — Z0001 Encounter for general adult medical examination with abnormal findings: Secondary | ICD-10-CM | POA: Diagnosis not present

## 2019-09-04 DIAGNOSIS — E611 Iron deficiency: Secondary | ICD-10-CM | POA: Diagnosis not present

## 2019-09-04 DIAGNOSIS — K219 Gastro-esophageal reflux disease without esophagitis: Secondary | ICD-10-CM | POA: Diagnosis not present

## 2019-09-04 DIAGNOSIS — R739 Hyperglycemia, unspecified: Secondary | ICD-10-CM | POA: Diagnosis not present

## 2019-09-04 DIAGNOSIS — E538 Deficiency of other specified B group vitamins: Secondary | ICD-10-CM | POA: Diagnosis not present

## 2019-09-04 DIAGNOSIS — E559 Vitamin D deficiency, unspecified: Secondary | ICD-10-CM

## 2019-09-04 DIAGNOSIS — E785 Hyperlipidemia, unspecified: Secondary | ICD-10-CM

## 2019-09-04 DIAGNOSIS — M1712 Unilateral primary osteoarthritis, left knee: Secondary | ICD-10-CM | POA: Diagnosis not present

## 2019-09-04 MED ORDER — ROSUVASTATIN CALCIUM 20 MG PO TABS: 20.0000 mg | ORAL_TABLET | Freq: Every day | ORAL | 3 refills | Status: DC

## 2019-09-04 MED FILL — ROSUVASTATIN CALCIUM 20 MG: 20 | 90 days supply | Qty: 90 | Fill #0

## 2019-09-04 NOTE — Progress Notes (Signed)
Subjective:    Patient ID: Jillian Andrade, female    DOB: [birth year], 58 y.o.   MRN: [id number]  HPI  Here for wellness and f/u;  Overall doing ok;  Pt denies Chest pain, worsening SOB, DOE, wheezing, orthopnea, PND, worsening LE edema, palpitations, dizziness or syncope.  Pt denies neurological change such as new headache, facial or extremity weakness.  Pt denies polydipsia, polyuria, or low sugar symptoms. Pt states overall good compliance with treatment and medications, good tolerability, and has been trying to follow appropriate diet.  Pt denies worsening depressive symptoms, suicidal ideation or panic. No fever, night sweats, wt loss, loss of appetite, or other constitutional symptoms.  Pt states good ability with ADL's, has low fall risk, home safety reviewed and adequate, no other significant changes in hearing or vision, and only occasionally active with exercise. Also no overt bleeding or bruising noted with lower platelets.   Also c/o worsening left knee pain mild to mod, intermittent, gradually worsening, sharp, worse to walk, better to sit, nothing else makes better or worse, no swelling, giveaways or falls Past Medical History:  Diagnosis Date  . Allergic rhinitis 03/06/2019  . Complication of anesthesia    n/v  . Fibroids in ovaries    pain in left side  . GERD (gastroesophageal reflux disease)    no medications at this time  . Hand osteomyelitis ([place]) 03/06/2019  . Heart murmur    back in the 90s with the mitral valve prolapse  . Insomnia 05/01/2016  . Lumbar disc disease 05/01/2016  . MVP (mitral valve prolapse) mild, no cardiologist  . No pertinent past medical history   . PONV (postoperative nausea and vomiting)    Past Surgical History:  Procedure Laterality Date  . ABDOMINAL HYSTERECTOMY    . ANKLE ARTHROSCOPY  10/31/2011   Procedure: ANKLE ARTHROSCOPY;  Surgeon: Colin Rhein, MD;  Location: [place];  Service: Orthopedics;  Laterality: Right;  RIGHT  ANKLE ARTHROSCOPY WITH EXTENSIVE DEBRIDEMENT  . ANTERIOR CERVICAL DECOMP/DISCECTOMY FUSION N/A 10/04/2016   Procedure: CERVICAL FIVE-CERVICAL SIX  ANTERIOR CERVICAL DECOMPRESSION/DISCECTOMY/FUSION;  Surgeon: Erline Levine, MD;  Location: [place];  Service: Neurosurgery;  Laterality: N/A;  C5-6 ANTERIOR CERVICAL DECOMPRESSION/DISCECTOMY/FUSION  . COLONOSCOPY    . COLONOSCOPY N/A 03/02/2014   Procedure: COLONOSCOPY;  Surgeon: Cleotis Nipper, MD;  [facility];  Service: Endoscopy;  Laterality: N/A;  ultra slim scope  . DILATION AND CURETTAGE OF UTERUS    . HEMORRHOID SURGERY      reports that she is a non-smoker but has been exposed to tobacco smoke. She has never used smokeless tobacco. She reports that she does not drink alcohol or use drugs. family history includes Glaucoma in her mother; Hypertension in her father; Stroke in her father. Allergies  Allergen Reactions  . Bee Venom Anaphylaxis  . Eggs Or Egg-Derived Products Nausea And Vomiting  . Orange Juice [Orange Oil] Hives and Nausea And Vomiting  . Oxycodone Hives  . Wygesic [Propoxyphene N-Acetaminophen] Nausea And Vomiting   Current Outpatient Medications on File Prior to Visit  Medication Sig Dispense Refill  . cetirizine (ZYRTEC) 10 MG tablet Take 1 tablet (10 mg total) by mouth daily. 30 tablet 11  . diclofenac sodium (VOLTAREN) 1 % GEL Apply 2 g topically 4 (four) times daily as needed. 200 g 11  . estradiol (ESTRACE) 1 MG tablet Take 1 tablet (1 mg total) by mouth daily. 90 tablet 3  . Multiple Vitamins-Minerals (ALIVE WOMENS ENERGY)  TABS Take 1 tablet by mouth daily.    Marland Kitchen omeprazole (PRILOSEC) 40 MG capsule Take 1 capsule (40 mg total) by mouth daily. 30 capsule 0  . triamcinolone (NASACORT) 55 MCG/ACT AERO nasal inhaler Place 2 sprays into the nose daily. 1 Inhaler 12   No current facility-administered medications on file prior to visit.    Review of Systems Constitutional: Negative for other unusual diaphoresis,  sweats, appetite or weight changes HENT: Negative for other worsening hearing loss, ear pain, facial swelling, mouth sores or neck stiffness.   Eyes: Negative for other worsening pain, redness or other visual disturbance.  Respiratory: Negative for other stridor or swelling Cardiovascular: Negative for other palpitations or other chest pain  Gastrointestinal: Negative for worsening diarrhea or loose stools, blood in stool, distention or other pain Genitourinary: Negative for hematuria, flank pain or other change in urine volume.  Musculoskeletal: Negative for myalgias or other joint swelling.  Skin: Negative for other color change, or other wound or worsening drainage.  Neurological: Negative for other syncope or numbness. Hematological: Negative for other adenopathy or swelling Psychiatric/Behavioral: Negative for hallucinations, other worsening agitation, SI, self-injury, or new decreased concentration All otherwise neg per pt     Objective:   Physical Exam BP 116/76   Pulse 72   Temp 98.7 F (37.1 C) (Oral)   Ht 5\' 4"  (1.626 m)   Wt 181 lb (82.1 kg)   SpO2 94%   BMI 31.07 kg/m  VS noted,  Constitutional: Pt is oriented to person, place, and time. Appears well-developed and well-nourished, in no significant distress and comfortable Head: Normocephalic and atraumatic  Eyes: Conjunctivae and EOM are normal. Pupils are equal, round, and reactive to light Right Ear: External ear normal without discharge Left Ear: External ear normal without discharge Nose: Nose without discharge or deformity Mouth/Throat: Oropharynx is without other ulcerations and moist  Neck: Normal range of motion. Neck supple. No JVD present. No tracheal deviation present or significant neck LA or mass Cardiovascular: Normal rate, regular rhythm, normal heart sounds and intact distal pulses.   Pulmonary/Chest: WOB normal and breath sounds without rales or wheezing  Abdominal: Soft. Bowel sounds are normal. NT.  No HSM  Musculoskeletal: Normal range of motion. Exhibits no edema, except for left knee degnerative changes Lymphadenopathy: Has no other cervical adenopathy.  Neurological: Pt is alert and oriented to person, place, and time. Pt has normal reflexes. No cranial nerve deficit. Motor grossly intact, Gait intact Skin: Skin is warm and dry. No rash noted or new ulcerations Psychiatric:  Has normal mood and affect. Behavior is normal without agitation All otherwise neg per pt  Lab Results  Component Value Date   WBC 7.2 08/31/2019   HGB 13.7 08/31/2019   HCT 41.8 08/31/2019   PLT 107.0 (L) 08/31/2019   GLUCOSE 91 08/31/2019   CHOL 137 08/31/2019   TRIG 67.0 08/31/2019   HDL 60.30 08/31/2019   LDLCALC 64 08/31/2019   ALT 23 08/31/2019   AST 24 08/31/2019   NA 138 08/31/2019   K 4.3 08/31/2019   CL 103 08/31/2019   CREATININE 0.97 08/31/2019   BUN 12 08/31/2019   CO2 27 08/31/2019   TSH 1.17 08/31/2019   HGBA1C 6.0 08/31/2019        Assessment & Plan:

## 2019-09-04 NOTE — Patient Instructions (Addendum)
Please come to the LAB ONLY In 6 months for the repeat blood counts including the platelets  Please continue all other medications as before, and refills have been done if requested.  Please have the pharmacy call with any other refills you may need.  Please continue your efforts at being more active, low cholesterol diet, and weight control.  You are otherwise up to date with prevention measures today.  Please keep your appointments with your specialists as you may have planned  Please see Dr Tamala Julian of Sports medicine for the left knee if getting worse  Please remember to sign up for MyChart if you have not done so, as this will be important to you in the future with finding out test results, communicating by private email, and scheduling acute appointments online when needed.  Please return in 1 year for your yearly visit, or sooner if needed, with Lab testing done 3-5 days before

## 2019-09-04 NOTE — Assessment & Plan Note (Signed)
Worsening recently, for EGD with Dr Cristina Gong in 2 wks

## 2019-09-05 ENCOUNTER — Encounter: Payer: Self-pay | Admitting: Internal Medicine

## 2019-09-05 NOTE — Assessment & Plan Note (Signed)
stable overall by history and exam, recent data reviewed with pt, and pt to continue medical treatment as before,  to f/u any worsening symptoms or concerns  

## 2019-09-05 NOTE — Assessment & Plan Note (Signed)

## 2019-09-05 NOTE — Assessment & Plan Note (Addendum)
Mild to mod, cont alleve prn, ok for sport medicine f/u for worsening  In addition to the time spent performing CPE, I spent an additional 25 minutes face to face,in which greater than 50% of this time was spent in counseling and coordination of care for patient's acute illness as documented, including the differential dx, treatment, further evaluation and other management of left knee djd, TCP, GERD, HLD, hyperglycemia

## 2019-09-05 NOTE — Assessment & Plan Note (Signed)
Asympt, gradually worsening, etiology unclear, for f/u cbc in 6 mo and refer heme if worsening - declines referral for now

## 2019-09-07 ENCOUNTER — Telehealth: Payer: Self-pay

## 2019-09-07 NOTE — Telephone Encounter (Signed)
Copied from [place] [id number]. Topic: General - Other >> Sep 07, 2019  8:34 AM Rainey Pines A wrote: Patient would like to speak with nurse in regards to lab results. Please advise

## 2019-09-15 DIAGNOSIS — Z1159 Encounter for screening for other viral diseases: Secondary | ICD-10-CM | POA: Diagnosis not present

## 2019-09-18 ENCOUNTER — Encounter: Payer: 59 | Admitting: Internal Medicine

## 2019-09-18 DIAGNOSIS — K219 Gastro-esophageal reflux disease without esophagitis: Secondary | ICD-10-CM | POA: Diagnosis not present

## 2019-09-21 MED FILL — PANTOPRAZOLE SOD DR 40 MG T: 40 | 30 days supply | Qty: 60 | Fill #2

## 2019-09-22 ENCOUNTER — Encounter: Payer: Self-pay | Admitting: Internal Medicine

## 2019-09-22 DIAGNOSIS — D696 Thrombocytopenia, unspecified: Secondary | ICD-10-CM

## 2019-10-23 ENCOUNTER — Encounter: Payer: Self-pay | Admitting: Hematology & Oncology

## 2019-10-23 ENCOUNTER — Inpatient Hospital Stay (HOSPITAL_BASED_OUTPATIENT_CLINIC_OR_DEPARTMENT_OTHER): Payer: 59 | Admitting: Hematology & Oncology

## 2019-10-23 ENCOUNTER — Other Ambulatory Visit: Payer: Self-pay

## 2019-10-23 ENCOUNTER — Inpatient Hospital Stay: Payer: 59 | Attending: Hematology & Oncology

## 2019-10-23 VITALS — BP 122/75 | HR 55 | Temp 97.9°F | Resp 16 | Ht 64.5 in | Wt 181.0 lb

## 2019-10-23 DIAGNOSIS — Z832 Family history of diseases of the blood and blood-forming organs and certain disorders involving the immune mechanism: Secondary | ICD-10-CM

## 2019-10-23 DIAGNOSIS — Z90710 Acquired absence of both cervix and uterus: Secondary | ICD-10-CM | POA: Diagnosis not present

## 2019-10-23 DIAGNOSIS — D696 Thrombocytopenia, unspecified: Secondary | ICD-10-CM | POA: Diagnosis not present

## 2019-10-23 DIAGNOSIS — Z801 Family history of malignant neoplasm of trachea, bronchus and lung: Secondary | ICD-10-CM | POA: Insufficient documentation

## 2019-10-23 LAB — CBC WITH DIFFERENTIAL (CANCER CENTER ONLY)
Abs Immature Granulocytes: 0.01 10*3/uL (ref 0.00–0.07)
Basophils Absolute: 0 10*3/uL (ref 0.0–0.1)
Basophils Relative: 0 %
Eosinophils Absolute: 0.1 10*3/uL (ref 0.0–0.5)
Eosinophils Relative: 1 %
HCT: 39 % (ref 36.0–46.0)
Hemoglobin: 12.6 g/dL (ref 12.0–15.0)
Immature Granulocytes: 0 %
Lymphocytes Relative: 24 %
Lymphs Abs: 1.3 10*3/uL (ref 0.7–4.0)
MCH: 29.3 pg (ref 26.0–34.0)
MCHC: 32.3 g/dL (ref 30.0–36.0)
MCV: 90.7 fL (ref 80.0–100.0)
Monocytes Absolute: 0.3 10*3/uL (ref 0.1–1.0)
Monocytes Relative: 5 %
Neutro Abs: 3.8 10*3/uL (ref 1.7–7.7)
Neutrophils Relative %: 70 %
Platelet Count: 124 10*3/uL — ABNORMAL LOW (ref 150–400)
RBC: 4.3 MIL/uL (ref 3.87–5.11)
RDW: 12.8 % (ref 11.5–15.5)
WBC Count: 5.5 10*3/uL (ref 4.0–10.5)
nRBC: 0 % (ref 0.0–0.2)

## 2019-10-23 LAB — PLATELET BY CITRATE

## 2019-10-23 LAB — CMP (CANCER CENTER ONLY)
ALT: 24 U/L (ref 0–44)
AST: 25 U/L (ref 15–41)
Albumin: 4.3 g/dL (ref 3.5–5.0)
Alkaline Phosphatase: 45 U/L (ref 38–126)
Anion gap: 6 (ref 5–15)
BUN: 13 mg/dL (ref 6–20)
CO2: 30 mmol/L (ref 22–32)
Calcium: 9.5 mg/dL (ref 8.9–10.3)
Chloride: 104 mmol/L (ref 98–111)
Creatinine: 1.02 mg/dL — ABNORMAL HIGH (ref 0.44–1.00)
GFR, Est AFR Am: >60 mL/min (ref >60)
GFR, Estimated: >60 mL/min (ref >60)
Glucose, Bld: 95 mg/dL (ref 70–99)
Potassium: 4.3 mmol/L (ref 3.5–5.1)
Sodium: 140 mmol/L (ref 135–145)
Total Bilirubin: 0.6 mg/dL (ref 0.3–1.2)
Total Protein: 6.7 g/dL (ref 6.5–8.1)

## 2019-10-23 LAB — SAVE SMEAR(SSMR), FOR PROVIDER SLIDE REVIEW

## 2019-10-23 LAB — HEPATITIS PANEL, ACUTE
HCV Ab: NONREACTIVE
Hep A IgM: NONREACTIVE
Hep B C IgM: NONREACTIVE
Hepatitis B Surface Ag: NONREACTIVE

## 2019-10-23 NOTE — Progress Notes (Signed)
Referral MD  Reason for Referral: Mild thrombocytopenia  Chief Complaint  Patient presents with  . New Patient (Initial Visit)  : I platelet count has been going down.  HPI: [name] is a very charming 59 year old African-American female.  She actually does not scheduling for the main OR at [facility].  She will be doing this for quite a while.  It was fun talking to her about this.  She is good friends with one of my wife's friends.  She is followed by Dr. Jenny Reichmann.  He is not noted that she has had mild progressive thrombocytopenia.  Ms. [name] says she has been under a lot of stress.  Her father passed away in 2023-07-14.  He had lung cancer and a "rare blood disorder."  She has had no bleeding.  There is been no bruising.  She does not smoke.  She has a rare alcoholic beverage.  There is no history of sickle cell in the family.  She has had no weight loss or weight gain.  There is been no medications given to her.  She does have some reflux.  She has had past surgery without any difficulties.  There is no swollen lymph nodes.  She has had no rashes.  She has had no change in bowel or bladder habits.  She is up-to-date with her mammogram.  She is up-to-date with her colonoscopy.  Recently, her platelet count was down to 107,000.  She has never had leukopenia or anemia.  She was kindly referred to the [facility] for an evaluation.  Overall, her performance status is ECOG 0.    Past Medical History:  Diagnosis Date  . Allergic rhinitis 03/06/2019  . Complication of anesthesia    n/v  . Fibroids in ovaries    pain in left side  . GERD (gastroesophageal reflux disease)    no medications at this time  . Hand osteomyelitis ([place]) 03/06/2019  . Heart murmur    back in the 90s with the mitral valve prolapse  . Insomnia 05/01/2016  . Lumbar disc disease 05/01/2016  . MVP (mitral valve prolapse) mild, no cardiologist  . No pertinent past medical history    . PONV (postoperative nausea and vomiting)   :  Past Surgical History:  Procedure Laterality Date  . ABDOMINAL HYSTERECTOMY    . ANKLE ARTHROSCOPY  10/31/2011   Procedure: ANKLE ARTHROSCOPY;  Surgeon: Colin Rhein, MD;  Location: [facility];  Service: Orthopedics;  Laterality: Right;  RIGHT ANKLE ARTHROSCOPY WITH EXTENSIVE DEBRIDEMENT  . ANTERIOR CERVICAL DECOMP/DISCECTOMY FUSION N/A 10/04/2016   Procedure: CERVICAL FIVE-CERVICAL SIX  ANTERIOR CERVICAL DECOMPRESSION/DISCECTOMY/FUSION;  Surgeon: Erline Levine, MD;  Location: [place];  Service: Neurosurgery;  Laterality: N/A;  C5-6 ANTERIOR CERVICAL DECOMPRESSION/DISCECTOMY/FUSION  . COLONOSCOPY    . COLONOSCOPY N/A 03/02/2014   Procedure: COLONOSCOPY;  Surgeon: Cleotis Nipper, MD;  Location: [facility];  Service: Endoscopy;  Laterality: N/A;  ultra slim scope  . DILATION AND CURETTAGE OF UTERUS    . HEMORRHOID SURGERY    :   Current Outpatient Medications:  .  diclofenac sodium (VOLTAREN) 1 % GEL, Apply 2 g topically 4 (four) times daily as needed., Disp: 200 g, Rfl: 11 .  estradiol (ESTRACE) 1 MG tablet, Take 1 tablet (1 mg total) by mouth daily., Disp: 90 tablet, Rfl: 3 .  Multiple Vitamins-Minerals (ALIVE WOMENS ENERGY) TABS, Take 1 tablet by mouth daily., Disp: , Rfl:  .  omeprazole (PRILOSEC) 40 MG  capsule, Take 1 capsule (40 mg total) by mouth daily., Disp: 30 capsule, Rfl: 0 .  rosuvastatin (CRESTOR) 20 MG tablet, Take 1 tablet (20 mg total) by mouth daily., Disp: 90 tablet, Rfl: 3:  :  Allergies  Allergen Reactions  . Bee Venom Anaphylaxis  . Eggs Or Egg-Derived Products Nausea And Vomiting  . Orange Juice [Orange Oil] Hives and Nausea And Vomiting  . Oxycodone Hives  . Wygesic [Propoxyphene N-Acetaminophen] Nausea And Vomiting  :  Family History  Problem Relation Age of Onset  . Stroke Father   . Hypertension Father   . Glaucoma Mother   :  Social History   Socioeconomic History  . Marital  status: Divorced    Spouse name: Not on file  . Number of children: Not on file  . Years of education: Not on file  . Highest education level: Not on file  Occupational History  . Not on file  Tobacco Use  . Smoking status: Passive Smoke Exposure - Never Smoker  . Smokeless tobacco: Never Used  Substance and Sexual Activity  . Alcohol use: No    Alcohol/week: 0.0 standard drinks  . Drug use: No  . Sexual activity: Not on file  Other Topics Concern  . Not on file  Social History Narrative  . Not on file   Social Determinants of Health   Financial Resource Strain:   . Difficulty of Paying Living Expenses: Not on file  Food Insecurity:   . Worried About Charity fundraiser in the Last Year: Not on file  . Ran Out of Food in the Last Year: Not on file  Transportation Needs:   . Lack of Transportation (Medical): Not on file  . Lack of Transportation (Non-Medical): Not on file  Physical Activity:   . Days of Exercise per Week: Not on file  . Minutes of Exercise per Session: Not on file  Stress:   . Feeling of Stress : Not on file  Social Connections:   . Frequency of Communication with Friends and Family: Not on file  . Frequency of Social Gatherings with Friends and Family: Not on file  . Attends Religious Services: Not on file  . Active Member of Clubs or Organizations: Not on file  . Attends Archivist Meetings: Not on file  . Marital Status: Not on file  Intimate Partner Violence:   . Fear of Current or Ex-Partner: Not on file  . Emotionally Abused: Not on file  . Physically Abused: Not on file  . Sexually Abused: Not on file  :  Review of Systems  Constitutional: Negative.   HENT: Negative.   Eyes: Negative.   Respiratory: Negative.   Cardiovascular: Negative.   Gastrointestinal: Negative.   Genitourinary: Negative.   Musculoskeletal: Negative.   Skin: Negative.   Neurological: Negative.   Endo/Heme/Allergies: Negative.   Psychiatric/Behavioral:  Negative.      Exam: Well-developed well-nourished African-American female in no obvious distress.  Vital signs are temperature of 97.9.  Pulse 55.  Blood pressure 122/75.  Weight is 181 pounds.  Head neck exam shows no ocular or oral lesions.  There is no scleral icterus.  She has no palpable thyroid.  She had no adenopathy in the neck or supraclavicular region.  Lungs are clear bilaterally.  Cardiac exam regular rate and rhythm with no murmurs, rubs or bruits.  Abdomen is soft.  She has good bowel sounds.  There is no fluid wave.  There is no palpable liver  or spleen tip.  Back exam shows no tenderness over the spine, ribs or hips.  Extremities shows no clubbing, cyanosis or edema.  She has good range of motion of her joints.  Neurological exam shows no focal neurological deficits.  Skin exam shows no rashes, ecchymoses or petechia.  @IPVITALS @   Recent Labs    10/23/19 1100  WBC 5.5  HGB 12.6  HCT 39.0  PLT 124*   Recent Labs    10/23/19 1100  NA 140  K 4.3  CL 104  CO2 30  GLUCOSE 95  BUN 13  CREATININE 1.02*  CALCIUM 9.5    Blood smear review: Normochromic and normocytic population of red blood cells.  She has no nucleated red blood cells.  There are no teardrop cells.  She has no schistocytes or spherocytes.  There is no rouleaux formation.  White cells appear normal in morphology and maturation.  There is no immature myeloid or lymphoid forms.  There is no hypersegmented polys.  Platelets are mildly decreased in number.  She has well granulated platelets.  She has some large platelets.  Pathology: None    Assessment and Plan: [name] is a very charming 59 year old African-American female.  She is originally from [facility] in [place].  We had a really good time talking about that since I lived in that area for 4 years when I was a teenager.  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  Her blood smear would be consistent with  this.  She is really on no medications.  I cannot find anything on her physical exam that would suggest a hematologic malignancy.  She does not need a bone marrow test.  I really do not think she needs any kind of radiographic studies.  I cannot feel her spleen.  I really think that we can just follow her along.  I think that observation is all that we need.  Her platelet count is better today than it was a couple months ago.  The fact that she has no leukopenia or anemia and has a normal white blood cell differential is reassuring that there is no myelodysplasia.  I spent about 45 minutes with Ms. Goetzinger.  Again she is very nice.  I went over her lab work.  I went over my recommendations.  She was very relieved.  She really enjoys working for the [facility].  She certainly enjoys working with the surgeons.  I totally understand this.  I think we can probably get her back in about 3 or 4 months.  We can see what her platelet count is at that time.

## 2019-10-26 ENCOUNTER — Telehealth: Payer: Self-pay | Admitting: Hematology & Oncology

## 2019-10-26 LAB — HIV-1 RNA QUANT-NO REFLEX-BLD
HIV 1 RNA Quant: <20 copies/mL
LOG10 HIV-1 RNA: UNDETERMINED log10copy/mL

## 2019-10-26 LAB — LACTATE DEHYDROGENASE: LDH: 214 U/L — ABNORMAL HIGH (ref 98–192)

## 2019-10-26 NOTE — Telephone Encounter (Signed)
Called and spoke with patient regarding appointments added per 1/11 los

## 2019-11-20 MED FILL — PANTOPRAZOLE SOD DR 40 MG T: 40 | 30 days supply | Qty: 60 | Fill #3

## 2019-12-07 MED FILL — ROSUVASTATIN CALCIUM 20 MG: 20 | 90 days supply | Qty: 90 | Fill #1

## 2019-12-08 MED FILL — ESTRADIOL 2 MG TABLET: 2 | 30 days supply | Qty: 30 | Fill #0

## 2019-12-22 DIAGNOSIS — H25013 Cortical age-related cataract, bilateral: Secondary | ICD-10-CM | POA: Diagnosis not present

## 2019-12-22 DIAGNOSIS — H04123 Dry eye syndrome of bilateral lacrimal glands: Secondary | ICD-10-CM | POA: Diagnosis not present

## 2019-12-22 DIAGNOSIS — H1132 Conjunctival hemorrhage, left eye: Secondary | ICD-10-CM | POA: Diagnosis not present

## 2019-12-28 MED FILL — RESTASIS 0.05% EYE EMULSION: 0.05 | 90 days supply | Qty: 180 | Fill #0

## 2020-01-22 MED FILL — PANTOPRAZOLE SOD DR 40 MG T: 40 | 90 days supply | Qty: 90 | Fill #0

## 2020-02-02 MED FILL — ESTRADIOL 2 MG TABS: 2 | 30 days supply | Qty: 30 | Fill #1

## 2020-02-12 DIAGNOSIS — K219 Gastro-esophageal reflux disease without esophagitis: Secondary | ICD-10-CM | POA: Diagnosis not present

## 2020-02-22 ENCOUNTER — Other Ambulatory Visit: Payer: Self-pay

## 2020-02-22 ENCOUNTER — Encounter: Payer: Self-pay | Admitting: Hematology & Oncology

## 2020-02-22 ENCOUNTER — Inpatient Hospital Stay: Payer: 59 | Attending: Hematology & Oncology

## 2020-02-22 ENCOUNTER — Inpatient Hospital Stay (HOSPITAL_BASED_OUTPATIENT_CLINIC_OR_DEPARTMENT_OTHER): Payer: 59 | Admitting: Hematology & Oncology

## 2020-02-22 VITALS — BP 125/65 | HR 58 | Temp 98.1°F | Resp 17 | Wt 184.0 lb

## 2020-02-22 DIAGNOSIS — D696 Thrombocytopenia, unspecified: Secondary | ICD-10-CM | POA: Insufficient documentation

## 2020-02-22 LAB — CBC WITH DIFFERENTIAL (CANCER CENTER ONLY)
Abs Immature Granulocytes: 0.01 10*3/uL (ref 0.00–0.07)
Basophils Absolute: 0 10*3/uL (ref 0.0–0.1)
Basophils Relative: 1 %
Eosinophils Absolute: 0.1 10*3/uL (ref 0.0–0.5)
Eosinophils Relative: 2 %
HCT: 38.7 % (ref 36.0–46.0)
Hemoglobin: 12.6 g/dL (ref 12.0–15.0)
Immature Granulocytes: 0 %
Lymphocytes Relative: 23 %
Lymphs Abs: 1.5 10*3/uL (ref 0.7–4.0)
MCH: 29.7 pg (ref 26.0–34.0)
MCHC: 32.6 g/dL (ref 30.0–36.0)
MCV: 91.3 fL (ref 80.0–100.0)
Monocytes Absolute: 0.3 10*3/uL (ref 0.1–1.0)
Monocytes Relative: 5 %
Neutro Abs: 4.3 10*3/uL (ref 1.7–7.7)
Neutrophils Relative %: 69 %
Platelet Count: 130 10*3/uL — ABNORMAL LOW (ref 150–400)
RBC: 4.24 MIL/uL (ref 3.87–5.11)
RDW: 12.5 % (ref 11.5–15.5)
WBC Count: 6.3 10*3/uL (ref 4.0–10.5)
nRBC: 0 % (ref 0.0–0.2)

## 2020-02-22 LAB — PLATELET BY CITRATE

## 2020-02-22 LAB — SAVE SMEAR(SSMR), FOR PROVIDER SLIDE REVIEW

## 2020-02-22 NOTE — Progress Notes (Signed)
Hematology and Oncology Follow Up Visit  DASHONNA ZAMBELLI [id number] 1961-10-06 59 y.o. 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 Hackman is back for second office visit.  We last saw her back in January.  At that time, her platelet count was not all that bad.  She is doing well.  She has had no problems since we last saw her.  She is still working.  She works in the [place] at [employer].  She is has is been quite busy there.  She currently is on vacation.  She has had no bleeding or bruising.  She has had no problems with weight loss or weight gain.  She has had no rashes.  She has had her coronavirus vaccines..  There is been no change in bowel or bladder habits.  She has had no cough or shortness of breath.  Overall, her performance status is ECOG 0.  Medications:  Current Outpatient Medications:  .  diclofenac sodium (VOLTAREN) 1 % GEL, Apply 2 g topically 4 (four) times daily as needed., Disp: 200 g, Rfl: 11 .  estradiol (ESTRACE) 1 MG tablet, Take 1 tablet (1 mg total) by mouth daily., Disp: 90 tablet, Rfl: 3 .  Multiple Vitamins-Minerals (ALIVE WOMENS ENERGY) TABS, Take 1 tablet by mouth daily., Disp: , Rfl:  .  omeprazole (PRILOSEC) 40 MG capsule, Take 1 capsule (40 mg total) by mouth daily., Disp: 30 capsule, Rfl: 0 .  pantoprazole (PROTONIX) 40 MG tablet, Take 40 mg by mouth every morning., Disp: , Rfl:  .  RESTASIS 0.05 % ophthalmic emulsion, , Disp: , Rfl:  .  rosuvastatin (CRESTOR) 20 MG tablet, Take 1 tablet (20 mg total) by mouth daily., Disp: 90 tablet, Rfl: 3  Allergies:  Allergies  Allergen Reactions  . Bee Venom Anaphylaxis  . Eggs Or Egg-Derived Products Nausea And Vomiting  . Orange Juice [Orange Oil] Hives and Nausea And Vomiting  . Oxycodone Hives  . Wygesic [Propoxyphene N-Acetaminophen] Nausea And Vomiting    Past Medical History, Surgical history,  Social history, and Family History were reviewed and updated.  Review of Systems: Review of Systems  Constitutional: Negative.   HENT:  Negative.   Eyes: Negative.   Respiratory: Negative.   Cardiovascular: Negative.   Gastrointestinal: Negative.   Endocrine: Negative.   Genitourinary: Negative.    Musculoskeletal: Negative.   Skin: Negative.   Neurological: Negative.   Hematological: Negative.   Psychiatric/Behavioral: Negative.     Physical Exam:  weight is 184 lb (83.5 kg). Her temporal temperature is 98.1 F (36.7 C). Her blood pressure is 125/65 and her pulse is 58 (abnormal). Her respiration is 17 and oxygen saturation is 100%.   Wt Readings from Last 3 Encounters:  02/22/20 184 lb (83.5 kg)  10/23/19 181 lb (82.1 kg)  09/04/19 181 lb (82.1 kg)    Physical Exam Vitals reviewed.  HENT:     Head: Normocephalic and atraumatic.  Eyes:     Pupils: Pupils are equal, round, and reactive to light.  Cardiovascular:     Rate and Rhythm: Normal rate and regular rhythm.     Heart sounds: Normal heart sounds.  Pulmonary:     Effort: Pulmonary effort is normal.     Breath sounds: Normal breath sounds.  Abdominal:     General: Bowel sounds are normal.     Palpations: Abdomen is soft.  Musculoskeletal:  General: No tenderness or deformity. Normal range of motion.     Cervical back: Normal range of motion.  Lymphadenopathy:     Cervical: No cervical adenopathy.  Skin:    General: Skin is warm and dry.     Findings: No erythema or rash.  Neurological:     Mental Status: She is alert and oriented to person, place, and time.  Psychiatric:        Behavior: Behavior normal.        Thought Content: Thought content normal.        Judgment: Judgment normal.      Lab Results  Component Value Date   WBC 6.3 02/22/2020   HGB 12.6 02/22/2020   HCT 38.7 02/22/2020   MCV 91.3 02/22/2020   PLT 130 (L) 02/22/2020     Chemistry      Component Value Date/Time   NA  140 10/23/2019 1100   K 4.3 10/23/2019 1100   CL 104 10/23/2019 1100   CO2 30 10/23/2019 1100   BUN 13 10/23/2019 1100   CREATININE 1.02 (H) 10/23/2019 1100      Component Value Date/Time   CALCIUM 9.5 10/23/2019 1100   ALKPHOS 45 10/23/2019 1100   AST 25 10/23/2019 1100   ALT 24 10/23/2019 1100   BILITOT 0.6 10/23/2019 1100       Impression and Plan: Ms. Ahearn is a wonderful 59 year old after American female.  She has mild thrombocytopenia.  Her platelet count seems to be trending upward.  I looked at her blood smear under the microscope.  Her platelets looked mature.  She had a few large platelets.  Platelets were well granulated.  I just do not see that we have to see her back.  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  I would be surprised if she ever had a problem with her platelets being low.  I told her that if she ever has a problem with bruising or bleeding, she can always come back and we will be more than happy to see her again.  She is definitely okay with this.   Volanda Napoleon, MD 5/10/202111:08 AM

## 2020-03-01 MED FILL — ROSUVASTATIN CALCIUM 20 MG: 20 | 90 days supply | Qty: 90 | Fill #2

## 2020-04-04 MED FILL — ESTRADIOL 2 MG TABS: 2 | 30 days supply | Qty: 30 | Fill #2

## 2020-05-26 MED FILL — PANTOPRAZOLE SOD DR 40 MG T: 40 | 90 days supply | Qty: 90 | Fill #1

## 2020-05-26 MED FILL — ROSUVASTATIN CALCIUM 20 MG: 20 | 90 days supply | Qty: 90 | Fill #3

## 2020-05-26 MED FILL — ESTRADIOL 2 MG TABS: 2 | 30 days supply | Qty: 30 | Fill #3

## 2020-06-02 DIAGNOSIS — Z01419 Encounter for gynecological examination (general) (routine) without abnormal findings: Secondary | ICD-10-CM | POA: Diagnosis not present

## 2020-06-02 DIAGNOSIS — Z6833 Body mass index (BMI) 33.0-33.9, adult: Secondary | ICD-10-CM | POA: Diagnosis not present

## 2020-07-21 ENCOUNTER — Other Ambulatory Visit: Payer: Self-pay | Admitting: Obstetrics & Gynecology

## 2020-07-21 DIAGNOSIS — Z1231 Encounter for screening mammogram for malignant neoplasm of breast: Secondary | ICD-10-CM

## 2020-07-29 ENCOUNTER — Other Ambulatory Visit (HOSPITAL_COMMUNITY): Payer: Self-pay | Admitting: Obstetrics & Gynecology

## 2020-07-29 MED FILL — ESTRADIOL 2 MG TABS: 2 | 30 days supply | Qty: 30 | Fill #0

## 2020-08-15 ENCOUNTER — Other Ambulatory Visit: Payer: Self-pay

## 2020-08-15 ENCOUNTER — Ambulatory Visit
Admission: RE | Admit: 2020-08-15 | Discharge: 2020-08-15 | Disposition: A | Discharge status: Home / Self Care | Payer: 59 | Source: Ambulatory Visit | Attending: Obstetrics & Gynecology | Admitting: Obstetrics & Gynecology

## 2020-08-15 ENCOUNTER — Other Ambulatory Visit: Payer: Self-pay | Admitting: Obstetrics & Gynecology

## 2020-08-15 DIAGNOSIS — Z1231 Encounter for screening mammogram for malignant neoplasm of breast: Secondary | ICD-10-CM

## 2020-08-25 ENCOUNTER — Other Ambulatory Visit: Payer: Self-pay | Admitting: Internal Medicine

## 2020-08-25 MED FILL — ROSUVASTATIN CALCIUM 20 MG: 20 | 90 days supply | Qty: 90 | Fill #0

## 2020-09-12 ENCOUNTER — Other Ambulatory Visit (INDEPENDENT_AMBULATORY_CARE_PROVIDER_SITE_OTHER): Payer: 59

## 2020-09-12 DIAGNOSIS — R739 Hyperglycemia, unspecified: Secondary | ICD-10-CM | POA: Diagnosis not present

## 2020-09-12 DIAGNOSIS — E559 Vitamin D deficiency, unspecified: Secondary | ICD-10-CM | POA: Diagnosis not present

## 2020-09-12 DIAGNOSIS — Z0001 Encounter for general adult medical examination with abnormal findings: Secondary | ICD-10-CM

## 2020-09-12 DIAGNOSIS — E538 Deficiency of other specified B group vitamins: Secondary | ICD-10-CM | POA: Diagnosis not present

## 2020-09-12 LAB — CBC WITH DIFFERENTIAL/PLATELET
Basophils Absolute: 0 10*3/uL (ref 0.0–0.1)
Basophils Relative: 0.5 % (ref 0.0–3.0)
Eosinophils Absolute: 0.1 10*3/uL (ref 0.0–0.7)
Eosinophils Relative: 1.7 % (ref 0.0–5.0)
HCT: 41.3 % (ref 36.0–46.0)
Hemoglobin: 13.7 g/dL (ref 12.0–15.0)
Lymphocytes Relative: 26 % (ref 12.0–46.0)
Lymphs Abs: 1.7 10*3/uL (ref 0.7–4.0)
MCHC: 33.1 g/dL (ref 30.0–36.0)
MCV: 90.2 fl (ref 78.0–100.0)
Monocytes Absolute: 0.4 10*3/uL (ref 0.1–1.0)
Monocytes Relative: 6 % (ref 3.0–12.0)
Neutro Abs: 4.3 10*3/uL (ref 1.4–7.7)
Neutrophils Relative %: 65.8 % (ref 43.0–77.0)
Platelets: 128 10*3/uL — ABNORMAL LOW (ref 150.0–400.0)
RBC: 4.58 Mil/uL (ref 3.87–5.11)
RDW: 13.6 % (ref 11.5–15.5)
WBC: 6.5 10*3/uL (ref 4.0–10.5)

## 2020-09-12 LAB — URINALYSIS, ROUTINE W REFLEX MICROSCOPIC
Bilirubin Urine: NEGATIVE
Hgb urine dipstick: NEGATIVE
Ketones, ur: NEGATIVE
Leukocytes,Ua: NEGATIVE
Nitrite: NEGATIVE
Specific Gravity, Urine: >=1.03 — AB (ref 1.000–1.030)
Total Protein, Urine: NEGATIVE
Urine Glucose: NEGATIVE
Urobilinogen, UA: 0.2 (ref 0.0–1.0)
pH: 6 (ref 5.0–8.0)

## 2020-09-12 LAB — TSH: TSH: 0.82 u[IU]/mL (ref 0.35–4.50)

## 2020-09-12 LAB — LIPID PANEL
Cholesterol: 131 mg/dL (ref 0–200)
HDL: 64 mg/dL (ref >39.00)
LDL Cholesterol: 57 mg/dL (ref 0–99)
NonHDL: 67.26
Total CHOL/HDL Ratio: 2
Triglycerides: 52 mg/dL (ref 0.0–149.0)
VLDL: 10.4 mg/dL (ref 0.0–40.0)

## 2020-09-12 LAB — VITAMIN D 25 HYDROXY (VIT D DEFICIENCY, FRACTURES): VITD: 99.6 ng/mL (ref 30.00–100.00)

## 2020-09-12 LAB — HEMOGLOBIN A1C: Hgb A1c MFr Bld: 6 % (ref 4.6–6.5)

## 2020-09-12 LAB — HEPATIC FUNCTION PANEL
ALT: 17 U/L (ref 0–35)
AST: 21 U/L (ref 0–37)
Albumin: 4.3 g/dL (ref 3.5–5.2)
Alkaline Phosphatase: 45 U/L (ref 39–117)
Bilirubin, Direct: 0.1 mg/dL (ref 0.0–0.3)
Total Bilirubin: 0.4 mg/dL (ref 0.2–1.2)
Total Protein: 7.2 g/dL (ref 6.0–8.3)

## 2020-09-12 LAB — BASIC METABOLIC PANEL
BUN: 14 mg/dL (ref 6–23)
CO2: 28 mEq/L (ref 19–32)
Calcium: 9.3 mg/dL (ref 8.4–10.5)
Chloride: 106 mEq/L (ref 96–112)
Creatinine, Ser: 0.92 mg/dL (ref 0.40–1.20)
GFR: 69.25 mL/min (ref >60.00)
Glucose, Bld: 91 mg/dL (ref 70–99)
Potassium: 4.1 mEq/L (ref 3.5–5.1)
Sodium: 140 mEq/L (ref 135–145)

## 2020-09-12 LAB — VITAMIN B12: Vitamin B-12: 839 pg/mL (ref 211–911)

## 2020-09-16 ENCOUNTER — Encounter: Payer: Self-pay | Admitting: Internal Medicine

## 2020-09-16 ENCOUNTER — Other Ambulatory Visit: Payer: Self-pay

## 2020-09-16 ENCOUNTER — Ambulatory Visit (INDEPENDENT_AMBULATORY_CARE_PROVIDER_SITE_OTHER): Payer: 59 | Admitting: Internal Medicine

## 2020-09-16 VITALS — BP 122/72 | HR 57 | Temp 98.5°F | Ht 64.5 in | Wt 189.0 lb

## 2020-09-16 DIAGNOSIS — Z0000 Encounter for general adult medical examination without abnormal findings: Secondary | ICD-10-CM | POA: Diagnosis not present

## 2020-09-16 DIAGNOSIS — R739 Hyperglycemia, unspecified: Secondary | ICD-10-CM | POA: Diagnosis not present

## 2020-09-16 DIAGNOSIS — E559 Vitamin D deficiency, unspecified: Secondary | ICD-10-CM

## 2020-09-16 DIAGNOSIS — E785 Hyperlipidemia, unspecified: Secondary | ICD-10-CM | POA: Diagnosis not present

## 2020-09-16 MED FILL — ESTRADIOL 2 MG TABS: 2 | 30 days supply | Qty: 30 | Fill #1

## 2020-09-16 NOTE — Progress Notes (Signed)
Subjective:    Patient ID: Jillian Andrade, female    DOB: [birth year], 59 y.o.   MRN: [id number]  HPI  Here for wellness and f/u;  Overall doing ok;  Pt denies Chest pain, worsening SOB, DOE, wheezing, orthopnea, PND, worsening LE edema, palpitations, dizziness or syncope.  Pt denies neurological change such as new headache, facial or extremity weakness.  Pt denies polydipsia, polyuria, or low sugar symptoms. Pt states overall good compliance with treatment and medications, good tolerability, and has been trying to follow appropriate diet.  Pt denies worsening depressive symptoms, suicidal ideation or panic. No fever, night sweats, wt loss, loss of appetite, or other constitutional symptoms.  Pt states good ability with ADL's, has low fall risk, home safety reviewed and adequate, no other significant changes in hearing or vision, and only occasionally active with exercise.  No new complaints Past Medical History:  Diagnosis Date  . Allergic rhinitis 03/06/2019  . Complication of anesthesia    n/v  . Fibroids in ovaries    pain in left side  . GERD (gastroesophageal reflux disease)    no medications at this time  . Hand osteomyelitis ([place]) 03/06/2019  . Heart murmur    back in the 90s with the mitral valve prolapse  . Insomnia 05/01/2016  . Lumbar disc disease 05/01/2016  . MVP (mitral valve prolapse) mild, no cardiologist  . No pertinent past medical history   . PONV (postoperative nausea and vomiting)    Past Surgical History:  Procedure Laterality Date  . ABDOMINAL HYSTERECTOMY    . ANKLE ARTHROSCOPY  10/31/2011   Procedure: ANKLE ARTHROSCOPY;  Surgeon: Colin Rhein, MD;  Location: [facility];  Service: Orthopedics;  Laterality: Right;  RIGHT ANKLE ARTHROSCOPY WITH EXTENSIVE DEBRIDEMENT  . ANTERIOR CERVICAL DECOMP/DISCECTOMY FUSION N/A 10/04/2016   Procedure: CERVICAL FIVE-CERVICAL SIX  ANTERIOR CERVICAL DECOMPRESSION/DISCECTOMY/FUSION;  Surgeon: Erline Levine, MD;   Location: [facility];  Service: Neurosurgery;  Laterality: N/A;  C5-6 ANTERIOR CERVICAL DECOMPRESSION/DISCECTOMY/FUSION  . COLONOSCOPY    . COLONOSCOPY N/A 03/02/2014   Procedure: COLONOSCOPY;  Surgeon: Cleotis Nipper, MD;  [facility];  Service: Endoscopy;  Laterality: N/A;  ultra slim scope  . DILATION AND CURETTAGE OF UTERUS    . HEMORRHOID SURGERY      reports that she is a non-smoker but has been exposed to tobacco smoke. She has never used smokeless tobacco. She reports that she does not drink alcohol and does not use drugs. family history includes Glaucoma in her mother; Hypertension in her father; Stroke in her father. Allergies  Allergen Reactions  . Bee Venom Anaphylaxis  . Eggs Or Egg-Derived Products Nausea And Vomiting  . Orange Juice [Orange Oil] Hives and Nausea And Vomiting  . Oxycodone Hives  . Wygesic [Propoxyphene N-Acetaminophen] Nausea And Vomiting   Current Outpatient Medications on File Prior to Visit  Medication Sig Dispense Refill  . diclofenac sodium (VOLTAREN) 1 % GEL Apply 2 g topically 4 (four) times daily as needed. 200 g 11  . estradiol (ESTRACE) 2 MG tablet Take 2 mg by mouth daily.    . Multiple Vitamins-Minerals (ALIVE WOMENS ENERGY) TABS Take 1 tablet by mouth daily.    Marland Kitchen omeprazole (PRILOSEC) 40 MG capsule Take 1 capsule (40 mg total) by mouth daily. 30 capsule 0  . pantoprazole (PROTONIX) 40 MG tablet Take 40 mg by mouth every morning.    . RESTASIS 0.05 % ophthalmic emulsion     . rosuvastatin (CRESTOR) 20  MG tablet TAKE 1 TABLET (20 MG TOTAL) BY MOUTH DAILY. 90 tablet 3   No current facility-administered medications on file prior to visit.   Review of Systems All otherwise neg per pt    Objective:   Physical Exam BP 122/72 (BP Location: Left Arm, Patient Position: Sitting, Cuff Size: Large)   Pulse (!) 57   Temp 98.5 F (36.9 C) (Oral)   Ht 5' 4.5" (1.638 m)   Wt 189 lb (85.7 kg)   SpO2 95%   BMI 31.94 kg/m  VS noted,    Constitutional: Pt appears in NAD HENT: Head: NCAT.  Right Ear: External ear normal.  Left Ear: External ear normal.  Eyes: . Pupils are equal, round, and reactive to light. Conjunctivae and EOM are normal Nose: without d/c or deformity Neck: Neck supple. Gross normal ROM Cardiovascular: Normal rate and regular rhythm.   Pulmonary/Chest: Effort normal and breath sounds without rales or wheezing.  Abd:  Soft, NT, ND, + BS, no organomegaly Neurological: Pt is alert. At baseline orientation, motor grossly intact Skin: Skin is warm. No rashes, other new lesions, no LE edema Psychiatric: Pt behavior is normal without agitation  All otherwise neg per pt Lab Results  Component Value Date   WBC 6.5 09/12/2020   HGB 13.7 09/12/2020   HCT 41.3 09/12/2020   PLT 128.0 (L) 09/12/2020   GLUCOSE 91 09/12/2020   CHOL 131 09/12/2020   TRIG 52.0 09/12/2020   HDL 64.00 09/12/2020   LDLCALC 57 09/12/2020   ALT 17 09/12/2020   AST 21 09/12/2020   NA 140 09/12/2020   K 4.1 09/12/2020   CL 106 09/12/2020   CREATININE 0.92 09/12/2020   BUN 14 09/12/2020   CO2 28 09/12/2020   TSH 0.82 09/12/2020   HGBA1C 6.0 09/12/2020      Assessment & Plan:

## 2020-09-17 ENCOUNTER — Encounter: Payer: Self-pay | Admitting: Internal Medicine

## 2020-09-17 NOTE — Assessment & Plan Note (Signed)
Lab Results  Component Value Date   LDLCALC 57 09/12/2020  stable overall by history and exam, recent data reviewed with pt, and pt to continue medical treatment as before,  to f/u any worsening symptoms or concerns

## 2020-09-17 NOTE — Assessment & Plan Note (Signed)
stable overall by history and exam, recent data reviewed with pt, and pt to continue medical treatment as before,  to f/u any worsening symptoms or concerns  

## 2020-09-17 NOTE — Assessment & Plan Note (Signed)
Lab Results  Component Value Date   HGBA1C 6.0 09/12/2020   stable overall by history and exam, recent data reviewed with pt, and pt to continue medical treatment as before,  to f/u any worsening symptoms or concerns

## 2020-09-17 NOTE — Patient Instructions (Signed)
Please continue all other medications as before, and refills have been done if requested.  Please have the pharmacy call with any other refills you may need.  Please continue your efforts at being more active, low cholesterol diet, and weight control.  You are otherwise up to date with prevention measures today.  Please keep your appointments with your specialists as you may have planned  Please make an Appointment to return for your 1 year visit, or sooner if needed

## 2020-09-17 NOTE — Assessment & Plan Note (Signed)
Cont oral replacement 

## 2020-09-23 ENCOUNTER — Other Ambulatory Visit: Payer: Self-pay

## 2020-09-23 ENCOUNTER — Ambulatory Visit
Admission: RE | Admit: 2020-09-23 | Discharge: 2020-09-23 | Disposition: A | Discharge status: Home / Self Care | Payer: 59 | Source: Ambulatory Visit | Attending: Obstetrics & Gynecology | Admitting: Obstetrics & Gynecology

## 2020-09-23 DIAGNOSIS — Z1231 Encounter for screening mammogram for malignant neoplasm of breast: Secondary | ICD-10-CM | POA: Diagnosis not present

## 2020-10-15 DIAGNOSIS — N281 Cyst of kidney, acquired: Secondary | ICD-10-CM

## 2020-10-15 HISTORY — DX: Cyst of kidney, acquired: N28.1

## 2020-11-14 ENCOUNTER — Other Ambulatory Visit (HOSPITAL_COMMUNITY): Payer: Self-pay | Admitting: Physician Assistant

## 2020-11-14 MED FILL — ESTRADIOL 2 MG TABS: 2 | 30 days supply | Qty: 30 | Fill #2

## 2020-11-14 MED FILL — PANTOPRAZOLE SOD DR 40 MG T: 40 | 90 days supply | Qty: 90 | Fill #0

## 2020-11-14 MED FILL — ROSUVASTATIN CALCIUM 20 MG: 20 | 90 days supply | Qty: 90 | Fill #1

## 2020-11-29 DIAGNOSIS — H524 Presbyopia: Secondary | ICD-10-CM | POA: Diagnosis not present

## 2020-11-29 DIAGNOSIS — H04123 Dry eye syndrome of bilateral lacrimal glands: Secondary | ICD-10-CM | POA: Diagnosis not present

## 2020-11-29 DIAGNOSIS — H40033 Anatomical narrow angle, bilateral: Secondary | ICD-10-CM | POA: Diagnosis not present

## 2020-11-29 DIAGNOSIS — H25013 Cortical age-related cataract, bilateral: Secondary | ICD-10-CM | POA: Diagnosis not present

## 2020-12-12 ENCOUNTER — Other Ambulatory Visit (HOSPITAL_COMMUNITY): Payer: Self-pay | Admitting: General Practice

## 2020-12-12 DIAGNOSIS — L659 Nonscarring hair loss, unspecified: Secondary | ICD-10-CM | POA: Diagnosis not present

## 2020-12-12 DIAGNOSIS — L669 Cicatricial alopecia, unspecified: Secondary | ICD-10-CM | POA: Diagnosis not present

## 2020-12-12 MED FILL — AMOXICILLIN 500 MG CAPSULE: 500 | 3 days supply | Qty: 12 | Fill #0

## 2020-12-16 ENCOUNTER — Other Ambulatory Visit (HOSPITAL_COMMUNITY): Payer: Self-pay | Admitting: Internal Medicine

## 2020-12-16 MED FILL — CLOBETASOL PROPIONATE 0.05: 0.05 | 21 days supply | Qty: 60 | Fill #0

## 2020-12-30 ENCOUNTER — Other Ambulatory Visit (HOSPITAL_COMMUNITY): Payer: Self-pay | Admitting: Optometry

## 2021-01-16 ENCOUNTER — Other Ambulatory Visit (HOSPITAL_COMMUNITY): Payer: Self-pay

## 2021-01-16 MED FILL — Estradiol Tab 2 MG: ORAL | 30 days supply | Qty: 30 | Fill #0 | Status: AC

## 2021-02-20 ENCOUNTER — Other Ambulatory Visit (HOSPITAL_COMMUNITY): Payer: Self-pay

## 2021-02-20 DIAGNOSIS — H04123 Dry eye syndrome of bilateral lacrimal glands: Secondary | ICD-10-CM | POA: Diagnosis not present

## 2021-02-20 DIAGNOSIS — H25013 Cortical age-related cataract, bilateral: Secondary | ICD-10-CM | POA: Diagnosis not present

## 2021-02-20 MED ORDER — OFLOXACIN 0.3 % OP SOLN
1.0000 [drp] | Freq: Four times a day (QID) | OPHTHALMIC | 1 refills | Status: DC
  Filled 2021-02-20: qty 5, 25d supply, fill #0
  Filled 2021-05-08: qty 5, 25d supply, fill #1

## 2021-02-20 MED ORDER — KETOROLAC TROMETHAMINE 0.4 % OP SOLN
1.0000 [drp] | Freq: Four times a day (QID) | OPHTHALMIC | 1 refills | Status: DC
Start: 2021-02-20 — End: 2021-11-20
  Filled 2021-02-20: qty 5, 25d supply, fill #0
  Filled 2021-05-08: qty 5, 25d supply, fill #1

## 2021-02-20 MED ORDER — PREDNISOLONE ACETATE 1 % OP SUSP
1.0000 [drp] | Freq: Four times a day (QID) | OPHTHALMIC | 1 refills | Status: DC
  Filled 2021-02-20: qty 10, 50d supply, fill #0
  Filled 2021-05-08: qty 10, 50d supply, fill #1

## 2021-02-20 MED FILL — Rosuvastatin Calcium Tab 20 MG: ORAL | 90 days supply | Qty: 90 | Fill #0 | Status: AC

## 2021-02-21 ENCOUNTER — Other Ambulatory Visit (HOSPITAL_COMMUNITY): Payer: Self-pay

## 2021-03-09 DIAGNOSIS — H268 Other specified cataract: Secondary | ICD-10-CM | POA: Diagnosis not present

## 2021-03-09 DIAGNOSIS — H5703 Miosis: Secondary | ICD-10-CM | POA: Diagnosis not present

## 2021-03-09 DIAGNOSIS — H25011 Cortical age-related cataract, right eye: Secondary | ICD-10-CM | POA: Diagnosis not present

## 2021-03-14 ENCOUNTER — Other Ambulatory Visit (HOSPITAL_COMMUNITY): Payer: Self-pay

## 2021-03-14 MED FILL — Estradiol Tab 2 MG: ORAL | 30 days supply | Qty: 30 | Fill #1 | Status: AC

## 2021-03-14 MED FILL — Pantoprazole Sodium EC Tab 40 MG (Base Equiv): ORAL | 90 days supply | Qty: 90 | Fill #0 | Status: AC

## 2021-05-05 ENCOUNTER — Other Ambulatory Visit (HOSPITAL_COMMUNITY): Payer: Self-pay

## 2021-05-05 MED FILL — Estradiol Tab 2 MG: ORAL | 30 days supply | Qty: 30 | Fill #2 | Status: AC

## 2021-05-05 MED FILL — Rosuvastatin Calcium Tab 20 MG: ORAL | 90 days supply | Qty: 90 | Fill #1 | Status: AC

## 2021-05-08 ENCOUNTER — Other Ambulatory Visit (HOSPITAL_COMMUNITY): Payer: Self-pay

## 2021-05-08 DIAGNOSIS — H2512 Age-related nuclear cataract, left eye: Secondary | ICD-10-CM | POA: Diagnosis not present

## 2021-05-25 DIAGNOSIS — H268 Other specified cataract: Secondary | ICD-10-CM | POA: Diagnosis not present

## 2021-05-25 DIAGNOSIS — H25012 Cortical age-related cataract, left eye: Secondary | ICD-10-CM | POA: Diagnosis not present

## 2021-06-09 ENCOUNTER — Other Ambulatory Visit (HOSPITAL_COMMUNITY): Payer: Self-pay

## 2021-06-09 DIAGNOSIS — Z01419 Encounter for gynecological examination (general) (routine) without abnormal findings: Secondary | ICD-10-CM | POA: Diagnosis not present

## 2021-06-09 DIAGNOSIS — Z6834 Body mass index (BMI) 34.0-34.9, adult: Secondary | ICD-10-CM | POA: Diagnosis not present

## 2021-06-09 MED ORDER — ESTRADIOL 1 MG PO TABS
1.0000 mg | ORAL_TABLET | Freq: Every day | ORAL | 3 refills | Status: DC
  Filled 2021-06-09: qty 90, 90d supply, fill #0
  Filled 2021-10-02: qty 90, 90d supply, fill #1
  Filled 2021-12-25: qty 90, 90d supply, fill #2
  Filled 2022-03-22: qty 90, 90d supply, fill #3

## 2021-08-14 ENCOUNTER — Telehealth: Payer: Self-pay | Admitting: Internal Medicine

## 2021-08-14 NOTE — Telephone Encounter (Signed)
Patient scheduled CPE 12/05  Wants provider to put lab orders in ahead of time so she can have them done & ready to discuss at CPE visit  Please advise patient when order has been placed [phone]

## 2021-08-14 NOTE — Telephone Encounter (Signed)
Patient notified

## 2021-08-17 DIAGNOSIS — R1032 Left lower quadrant pain: Secondary | ICD-10-CM | POA: Diagnosis not present

## 2021-08-21 ENCOUNTER — Other Ambulatory Visit (HOSPITAL_COMMUNITY): Payer: Self-pay

## 2021-08-21 ENCOUNTER — Other Ambulatory Visit: Payer: Self-pay | Admitting: Internal Medicine

## 2021-08-21 MED ORDER — ROSUVASTATIN CALCIUM 20 MG PO TABS
20.0000 mg | ORAL_TABLET | Freq: Every day | ORAL | 3 refills | Status: DC
  Filled 2021-08-21: qty 90, 90d supply, fill #0
  Filled 2021-11-09: qty 90, 90d supply, fill #1
  Filled 2022-02-12: qty 90, 90d supply, fill #2

## 2021-08-21 MED FILL — Cyclosporine (Ophth) Emulsion 0.05%: OPHTHALMIC | 90 days supply | Qty: 180 | Fill #0 | Status: AC

## 2021-08-21 NOTE — Telephone Encounter (Signed)
Please refill as per office routine med refill policy (all routine meds to be refilled for 3 mo or monthly (per pt preference) up to one year from last visit, then month to month grace period for 3 mo, then further med refills will have to be denied) ? ?

## 2021-08-22 ENCOUNTER — Other Ambulatory Visit (HOSPITAL_COMMUNITY): Payer: Self-pay

## 2021-08-23 ENCOUNTER — Other Ambulatory Visit (HOSPITAL_COMMUNITY): Payer: Self-pay

## 2021-08-23 ENCOUNTER — Other Ambulatory Visit: Payer: Self-pay | Admitting: Obstetrics & Gynecology

## 2021-08-23 DIAGNOSIS — Z1231 Encounter for screening mammogram for malignant neoplasm of breast: Secondary | ICD-10-CM

## 2021-08-23 MED ORDER — NITROFURANTOIN MONOHYD MACRO 100 MG PO CAPS
100.0000 mg | ORAL_CAPSULE | Freq: Two times a day (BID) | ORAL | 0 refills | Status: DC
  Filled 2021-08-23: qty 14, 7d supply, fill #0

## 2021-08-28 DIAGNOSIS — N390 Urinary tract infection, site not specified: Secondary | ICD-10-CM | POA: Diagnosis not present

## 2021-08-28 DIAGNOSIS — R300 Dysuria: Secondary | ICD-10-CM | POA: Diagnosis not present

## 2021-08-30 ENCOUNTER — Other Ambulatory Visit (HOSPITAL_COMMUNITY): Payer: Self-pay

## 2021-08-30 ENCOUNTER — Ambulatory Visit (HOSPITAL_COMMUNITY)
Admission: EM | Admit: 2021-08-30 | Discharge: 2021-08-30 | Disposition: A | Discharge status: Home / Self Care | Payer: 59 | Attending: Family Medicine | Admitting: Family Medicine

## 2021-08-30 ENCOUNTER — Encounter (HOSPITAL_COMMUNITY): Payer: Self-pay

## 2021-08-30 ENCOUNTER — Other Ambulatory Visit: Payer: Self-pay

## 2021-08-30 DIAGNOSIS — R109 Unspecified abdominal pain: Secondary | ICD-10-CM | POA: Diagnosis not present

## 2021-08-30 DIAGNOSIS — N3000 Acute cystitis without hematuria: Secondary | ICD-10-CM

## 2021-08-30 LAB — POCT URINALYSIS DIPSTICK, ED / UC
Bilirubin Urine: NEGATIVE
Glucose, UA: NEGATIVE mg/dL
Hgb urine dipstick: NEGATIVE
Ketones, ur: NEGATIVE mg/dL
Leukocytes,Ua: NEGATIVE
Nitrite: NEGATIVE
Protein, ur: NEGATIVE mg/dL
Specific Gravity, Urine: 1.02 (ref 1.005–1.030)
Urobilinogen, UA: 0.2 mg/dL (ref 0.0–1.0)
pH: 5.5 (ref 5.0–8.0)

## 2021-08-30 MED ORDER — KETOROLAC TROMETHAMINE 10 MG PO TABS
10.0000 mg | ORAL_TABLET | Freq: Three times a day (TID) | ORAL | 0 refills | Status: DC | PRN
  Filled 2021-08-30: qty 10, 4d supply, fill #0

## 2021-08-30 MED ORDER — KETOROLAC TROMETHAMINE 60 MG/2ML IM SOLN
60.0000 mg | Freq: Once | INTRAMUSCULAR | Status: AC
  Administered 2021-08-30: 60 mg via INTRAMUSCULAR

## 2021-08-30 MED ORDER — KETOROLAC TROMETHAMINE 60 MG/2ML IM SOLN
INTRAMUSCULAR | Status: AC
  Filled 2021-08-30: qty 2

## 2021-08-30 MED ORDER — NITROFURANTOIN MONOHYD MACRO 100 MG PO CAPS
100.0000 mg | ORAL_CAPSULE | Freq: Two times a day (BID) | ORAL | 0 refills | Status: AC
  Filled 2021-08-30: qty 6, 3d supply, fill #0

## 2021-08-30 NOTE — ED Provider Notes (Signed)
[place]    CSN: [id number] Arrival date & time: 08/30/21  1325      History   Chief Complaint Chief Complaint  Patient presents with   Flank Pain   UTI    HPI Jillian Andrade is a 60 y.o. female.   HPI Patient treated for urinary tract infection x 1 week ago with nitrofurantoin Completed last dose of nitrofurantoin this morning and dysuria and lower pelvic pressure reoccurred x 1 day ago. She has experienced left sided flank pain and denies any prior history of renal stones. Denies hematuria, fever or chills.  Past Medical History:  Diagnosis Date   Allergic rhinitis 6/60/6301   Complication of anesthesia    n/v   Fibroids in ovaries    pain in left side   GERD (gastroesophageal reflux disease)    no medications at this time   Hand osteomyelitis ([place]) 03/06/2019   Heart murmur    back in the 90s with the mitral valve prolapse   Insomnia 05/01/2016   Lumbar disc disease 05/01/2016   MVP (mitral valve prolapse) mild, no cardiologist   No pertinent past medical history    PONV (postoperative nausea and vomiting)     Patient Active Problem List   Diagnosis Date Noted   Vitamin D deficiency 09/16/2020   Thrombocytopenia ([place]) 09/04/2019   Left knee DJD 09/04/2019   GERD (gastroesophageal reflux disease) 06/05/2019   Dysphagia 06/05/2019   HLD (hyperlipidemia) 03/06/2019   Generalized osteoarthritis of hand 03/06/2019   Allergic rhinitis 03/06/2019   Hyperglycemia 09/02/2018   Herniated cervical disc without myelopathy 10/04/2016   Cervical pain (neck) 07/10/2016   Lumbar disc disease 05/01/2016   Lower back pain 05/01/2016   Insomnia 05/01/2016   Preventative health care 04/12/2015   Chest pain 04/12/2015   Hemoptysis 04/12/2015   Fibroids     Past Surgical History:  Procedure Laterality Date   ABDOMINAL HYSTERECTOMY     ANKLE ARTHROSCOPY  10/31/2011   Procedure: ANKLE ARTHROSCOPY;  Surgeon: Colin Rhein, MD;  Location: [facility];  Service: Orthopedics;  Laterality: Right;  RIGHT ANKLE ARTHROSCOPY WITH EXTENSIVE DEBRIDEMENT   ANTERIOR CERVICAL DECOMP/DISCECTOMY FUSION N/A 10/04/2016   Procedure: CERVICAL FIVE-CERVICAL SIX  ANTERIOR CERVICAL DECOMPRESSION/DISCECTOMY/FUSION;  Surgeon: Erline Levine, MD;  Location: [facility];  Service: Neurosurgery;  Laterality: N/A;  C5-6 ANTERIOR CERVICAL DECOMPRESSION/DISCECTOMY/FUSION   COLONOSCOPY     COLONOSCOPY N/A 03/02/2014   Procedure: COLONOSCOPY;  Surgeon: Cleotis Nipper, MD;  Location: [facility];  Service: Endoscopy;  Laterality: N/A;  ultra slim scope   DILATION AND CURETTAGE OF UTERUS     HEMORRHOID SURGERY      OB History   No obstetric history on file.      Home Medications    Prior to Admission medications   Medication Sig Start Date End Date Taking? Authorizing Provider  ketorolac (TORADOL) 10 MG tablet Take 1 tablet (10 mg total) by mouth every 8 (eight) hours as needed. 08/30/21  Yes Scot Jun, FNP  nitrofurantoin, macrocrystal-monohydrate, (MACROBID) 100 MG capsule Take 1 capsule (100 mg total) by mouth 2 (two) times daily for 3 days. 08/30/21 09/02/21 Yes Scot Jun, FNP  amoxicillin (AMOXIL) 500 MG capsule TAKE 4 CAPSULES BY MOUTH 1 HOURS PRIOR TO APPOINTMENT 12/12/20 12/12/21  Adornetto, Lucianne Lei, DMD  clobetasol ointment (TEMOVATE) 6.01 % APPLY 1 APPLICATION TOPICALLY 2 TIMES DAILY 12/16/20 12/16/21  Deirdre Pippins, PA-C  cycloSPORINE (RESTASIS) 0.05 % ophthalmic emulsion INSTILL  1 DROP IN BOTH EYES TWO TIMES DAILY 12/30/20 12/30/21  Warden Fillers, MD  diclofenac sodium (VOLTAREN) 1 % GEL Apply 2 g topically 4 (four) times daily as needed. 03/06/19   Biagio Borg, MD  estradiol (ESTRACE) 1 MG tablet Take 1 tablet (1 mg total) by mouth daily. 06/09/21     estradiol (ESTRACE) 2 MG tablet Take 2 mg by mouth daily. 07/29/20   [provider]  estradiol (ESTRACE) 2 MG tablet TAKE 1 TABLET BY MOUTH ONCE DAILY. 07/29/20 07/29/21   Maisie Fus, MD  ketorolac (ACULAR) 0.4 % SOLN Instill 1 drop into right eye four times a day STARTING 1 DAY BEFORE SURGERY 02/20/21     Multiple Vitamins-Minerals (ALIVE WOMENS ENERGY) TABS Take 1 tablet by mouth daily.    [provider]  ofloxacin (OCUFLOX) 0.3 % ophthalmic solution Instill 1 drop into right eye four times a day starting one day before surgery 02/20/21     omeprazole (PRILOSEC) 40 MG capsule Take 1 capsule (40 mg total) by mouth daily. 05/23/19   Zigmund Gottron, NP  pantoprazole (PROTONIX) 40 MG tablet Take 40 mg by mouth every morning. 01/22/20   [provider]  pantoprazole (PROTONIX) 40 MG tablet TAKE 1 TABLET BY MOUTH TWO TIMES DAILY 11/14/20 11/14/21  Deliah Goody, PA-C  pantoprazole (PROTONIX) 40 MG tablet TAKE 1 TABLET ONCE A DAY IN THE MORNING - TAKE 30 MINUTES PRIOR TO BREAKFAST 11/14/20 11/14/21  Buccini, Herbie Baltimore, MD  prednisoLONE acetate (PRED FORTE) 1 % ophthalmic suspension Instill 1 drop into right eye four times a day starting after surgery 02/20/21     RESTASIS 0.05 % ophthalmic emulsion  12/28/19   [provider]  rosuvastatin (CRESTOR) 20 MG tablet Take 1 tablet (20 mg total) by mouth daily. 08/21/21   Biagio Borg, MD    Family History Family History  Problem Relation Age of Onset   Stroke Father    Hypertension Father    Glaucoma Mother     Social History Social History   Tobacco Use   Smoking status: Passive Smoke Exposure - Never Smoker   Smokeless tobacco: Never  Substance Use Topics   Alcohol use: No    Alcohol/week: 0.0 standard drinks   Drug use: No     Allergies   Bee venom, Oxycodone-acetaminophen, Propoxyphene, Orange juice [orange oil], Oxycodone, and Wygesic [propoxyphene n-acetaminophen]   Review of Systems Review of Systems Pertinent negatives listed in HPI   Physical Exam Triage Vital Signs ED Triage Vitals  Enc Vitals Group     BP 08/30/21 1425 (!) 144/81     Pulse Rate 08/30/21 1425 60     Resp  08/30/21 1425 18     Temp 08/30/21 1425 98 F (36.7 C)     Temp Source 08/30/21 1425 Oral     SpO2 08/30/21 1425 98 %     Weight --      Height --      Head Circumference --      Peak Flow --      Pain Score 08/30/21 1423 7     Pain Loc --      Pain Edu? --      Excl. in [place]? --    No data found.  Updated Vital Signs BP (!) 144/81 (BP Location: Right Arm)   Pulse 60   Temp 98 F (36.7 C) (Oral)   Resp 18   SpO2 98%   Visual Acuity Right Eye  Distance:   Left Eye Distance:   Bilateral Distance:    Right Eye Near:   Left Eye Near:    Bilateral Near:     Physical Exam General appearance: Alert, well developed, well nourished, cooperative  Head: Normocephalic, without obvious abnormality, atraumatic Respiratory: Respirations even and unlabored, normal respiratory rate Heart: Rate and rhythm normal.  Abdomen: BS +, no distention, no rebound tenderness, or no mass Extremities: No gross deformities Skin: Skin color, texture, turgor normal. No rashes seen  Psych: Appropriate mood and affect. Neurologic: No acute focal neurological abnormality  UC Treatments / Results  Labs (all labs ordered are listed, but only abnormal results are displayed) Labs Reviewed  URINE CULTURE  POCT URINALYSIS DIPSTICK, ED / UC    EKG   Radiology No results found.  Procedures Procedures (including critical care time)  Medications Ordered in UC Medications  ketorolac (TORADOL) injection 60 mg (60 mg Intramuscular Given 08/30/21 1458)    Initial Impression / Assessment and Plan / UC Course  I have reviewed the triage vital signs and the nursing notes.  Pertinent labs & imaging results that were available during my care of the patient were reviewed by me and considered in my medical decision making (see chart for details).     Extending treatment for recently treated urinary tract infection while urine culture is pending.  Patient's PCP did not order a urine culture therefore will  order one to determine if patient is on the correct antibiotics.  In the meantime we will extend nitrofurantoin and additional 3 days.  Hydrate well with fluids. A brief course of Toradol for right sided flank pain which may be attributed to patient has chronic back pain vs possible renal stone.  Advised follow-up with PCP as needed Final Clinical Impressions(s) / UC Diagnoses   Final diagnoses:  Left flank pain  Acute cystitis without hematuria   Discharge Instructions   None    ED Prescriptions     Medication Sig Dispense Auth. Provider   nitrofurantoin, macrocrystal-monohydrate, (MACROBID) 100 MG capsule Take 1 capsule (100 mg total) by mouth 2 (two) times daily for 3 days. 6 capsule Scot Jun, FNP   ketorolac (TORADOL) 10 MG tablet Take 1 tablet (10 mg total) by mouth every 8 (eight) hours as needed. 10 tablet Scot Jun, FNP      PDMP not reviewed this encounter.   Scot Jun, FNP 08/31/21 1536

## 2021-08-30 NOTE — ED Triage Notes (Signed)
Pt presents with urinary pressure and flank pain for over a week; pt states she finished her last dose of antibiotic for UTI this morning.

## 2021-08-31 ENCOUNTER — Ambulatory Visit: Payer: 59 | Admitting: Nurse Practitioner

## 2021-08-31 LAB — URINE CULTURE
Culture: NO GROWTH
Special Requests: NORMAL

## 2021-09-01 ENCOUNTER — Emergency Department (HOSPITAL_COMMUNITY): Payer: 59

## 2021-09-01 ENCOUNTER — Other Ambulatory Visit: Payer: Self-pay

## 2021-09-01 ENCOUNTER — Ambulatory Visit: Payer: 59 | Admitting: Internal Medicine

## 2021-09-01 ENCOUNTER — Emergency Department (HOSPITAL_COMMUNITY)
Admission: EM | Admit: 2021-09-01 | Discharge: 2021-09-01 | Disposition: A | Discharge status: Home / Self Care | Payer: 59 | Attending: Emergency Medicine | Admitting: Emergency Medicine

## 2021-09-01 ENCOUNTER — Encounter (HOSPITAL_COMMUNITY): Payer: Self-pay | Admitting: Emergency Medicine

## 2021-09-01 DIAGNOSIS — Z7722 Contact with and (suspected) exposure to environmental tobacco smoke (acute) (chronic): Secondary | ICD-10-CM | POA: Diagnosis not present

## 2021-09-01 DIAGNOSIS — R519 Headache, unspecified: Secondary | ICD-10-CM | POA: Diagnosis not present

## 2021-09-01 DIAGNOSIS — K625 Hemorrhage of anus and rectum: Secondary | ICD-10-CM | POA: Insufficient documentation

## 2021-09-01 DIAGNOSIS — R0789 Other chest pain: Secondary | ICD-10-CM | POA: Diagnosis not present

## 2021-09-01 DIAGNOSIS — R079 Chest pain, unspecified: Secondary | ICD-10-CM

## 2021-09-01 DIAGNOSIS — R1032 Left lower quadrant pain: Secondary | ICD-10-CM | POA: Insufficient documentation

## 2021-09-01 DIAGNOSIS — R109 Unspecified abdominal pain: Secondary | ICD-10-CM | POA: Diagnosis not present

## 2021-09-01 LAB — CBC
HCT: 41.3 % (ref 36.0–46.0)
Hemoglobin: 13.2 g/dL (ref 12.0–15.0)
MCH: 29.3 pg (ref 26.0–34.0)
MCHC: 32 g/dL (ref 30.0–36.0)
MCV: 91.8 fL (ref 80.0–100.0)
Platelets: 148 10*3/uL — ABNORMAL LOW (ref 150–400)
RBC: 4.5 MIL/uL (ref 3.87–5.11)
RDW: 12.6 % (ref 11.5–15.5)
WBC: 8.5 10*3/uL (ref 4.0–10.5)
nRBC: 0 % (ref 0.0–0.2)

## 2021-09-01 LAB — COMPREHENSIVE METABOLIC PANEL
ALT: 23 U/L (ref 0–44)
AST: 29 U/L (ref 15–41)
Albumin: 4 g/dL (ref 3.5–5.0)
Alkaline Phosphatase: 49 U/L (ref 38–126)
Anion gap: 6 (ref 5–15)
BUN: 9 mg/dL (ref 6–20)
CO2: 24 mmol/L (ref 22–32)
Calcium: 9.5 mg/dL (ref 8.9–10.3)
Chloride: 109 mmol/L (ref 98–111)
Creatinine, Ser: 0.98 mg/dL (ref 0.44–1.00)
GFR, Estimated: >60 mL/min (ref >60)
Glucose, Bld: 104 mg/dL — ABNORMAL HIGH (ref 70–99)
Potassium: 4 mmol/L (ref 3.5–5.1)
Sodium: 139 mmol/L (ref 135–145)
Total Bilirubin: 0.6 mg/dL (ref 0.3–1.2)
Total Protein: 7 g/dL (ref 6.5–8.1)

## 2021-09-01 LAB — LIPASE, BLOOD: Lipase: 35 U/L (ref 11–51)

## 2021-09-01 LAB — URINALYSIS, ROUTINE W REFLEX MICROSCOPIC
Bilirubin Urine: NEGATIVE
Glucose, UA: NEGATIVE mg/dL
Hgb urine dipstick: NEGATIVE
Ketones, ur: NEGATIVE mg/dL
Leukocytes,Ua: NEGATIVE
Nitrite: NEGATIVE
Protein, ur: NEGATIVE mg/dL
Specific Gravity, Urine: 1.01 (ref 1.005–1.030)
pH: 5 (ref 5.0–8.0)

## 2021-09-01 LAB — TROPONIN I (HIGH SENSITIVITY)
Troponin I (High Sensitivity): 5 ng/L (ref <18)
Troponin I (High Sensitivity): 6 ng/L (ref <18)

## 2021-09-01 MED ORDER — MORPHINE SULFATE (PF) 4 MG/ML IV SOLN
4.0000 mg | Freq: Once | INTRAVENOUS | Status: AC
  Administered 2021-09-01: 4 mg via INTRAVENOUS
  Filled 2021-09-01: qty 1

## 2021-09-01 MED ORDER — ONDANSETRON HCL 4 MG/2ML IJ SOLN
4.0000 mg | Freq: Once | INTRAMUSCULAR | Status: AC
  Administered 2021-09-01: 4 mg via INTRAVENOUS
  Filled 2021-09-01: qty 2

## 2021-09-01 MED ORDER — IOHEXOL 300 MG/ML  SOLN
100.0000 mL | Freq: Once | INTRAMUSCULAR | Status: AC | PRN
  Administered 2021-09-01: 100 mL via INTRAVENOUS

## 2021-09-01 NOTE — ED Triage Notes (Signed)
Pt was given an antibiotic from the UC with also a urine culture. Went to UC on Tuesday or Wednesday,

## 2021-09-01 NOTE — Discharge Instructions (Addendum)
For your abdominal pain your CT scan was without any signs of acute inflammatory or infectious process.  Your blood work was without elevated white count which would raise concern for infection.  Your chest pain work-up was reassuring.  Your chest x-ray was normal.  Your cardiac enzymes were normal.  Your EKG was also not concerning.  Given that you have a history of acid reflux and your dose was decreased last year we can try to increase you back to 40 mg once daily until you follow-up with your gastroenterologist or primary care provider to see if that helps.  If in the meantime you develop worsening chest pain, breaking out in a sweat, lightheadedness please return to the emergency room.  If your abdominal pain worsens, you develop fever, nausea or vomiting to the point you are unable to keep any food or drink down please return to the emergency room.  You also have a cyst on your right kidney.  This does not need to be evaluated right now but it does need to be followed up with your primary care provider for monitoring.  You chose to follow-up with your gastroenterologist Dr. Roque Lias have included his information above for you.

## 2021-09-01 NOTE — ED Triage Notes (Signed)
Pt. Stated, I do have a history of Hemmoroids

## 2021-09-01 NOTE — ED Provider Notes (Addendum)
[facility] EMERGENCY DEPARTMENT Provider Note   CSN: [id number] Arrival date & time: 09/01/21  0754     History Chief Complaint  Patient presents with   Abdominal Pain   Rectal Bleeding   Headache    Jillian Andrade is a 60 y.o. female.  60 year old female presents today for evaluation of left lower quadrant abdominal pain and chest pain both of 1 week duration.  Patient reports initially began with left lower quadrant abdominal pain for which she was seen by her gynecologist and prescribed antibiotics for UTI.  She reports her pain worsened and radiated to her left flank after which she was evaluated at urgent care and her initial course of antibiotics was extended for 3 additional days.  She denies fever, chills, hematuria, vaginal discharge.  She reports this morning she woke up had a bowel movement with bright red blood in her stools and upon wiping.  She reports she does have a history of hemorrhoids with rectal bleeding in the past.  Patient describes her chest pain as intermittent sharp and stabbing that occasionally radiates to both sides of her chest that she describes as burning sensation.  She denies palpitations, lightheadedness, or radiation of her chest pain to her upper extremities or neck.  She endorses history of hypercholesterolemia but denies other cardiac history.  The history is provided by the patient. No language interpreter was used.  Abdominal Pain Associated symptoms: no cough and no shortness of breath   Rectal Bleeding     Past Medical History:  Diagnosis Date   Allergic rhinitis 8/52/7782   Complication of anesthesia    n/v   Fibroids in ovaries    pain in left side   GERD (gastroesophageal reflux disease)    no medications at this time   Hand osteomyelitis ([place]) 03/06/2019   Heart murmur    back in the 90s with the mitral valve prolapse   Insomnia 05/01/2016   Lumbar disc disease 05/01/2016   MVP (mitral valve prolapse) mild,  no cardiologist   No pertinent past medical history    PONV (postoperative nausea and vomiting)     Patient Active Problem List   Diagnosis Date Noted   Vitamin D deficiency 09/16/2020   Thrombocytopenia ([place]) 09/04/2019   Left knee DJD 09/04/2019   GERD (gastroesophageal reflux disease) 06/05/2019   Dysphagia 06/05/2019   HLD (hyperlipidemia) 03/06/2019   Generalized osteoarthritis of hand 03/06/2019   Allergic rhinitis 03/06/2019   Hyperglycemia 09/02/2018   Herniated cervical disc without myelopathy 10/04/2016   Cervical pain (neck) 07/10/2016   Lumbar disc disease 05/01/2016   Lower back pain 05/01/2016   Insomnia 05/01/2016   Preventative health care 04/12/2015   Chest pain 04/12/2015   Hemoptysis 04/12/2015   Fibroids     Past Surgical History:  Procedure Laterality Date   ABDOMINAL HYSTERECTOMY     ANKLE ARTHROSCOPY  10/31/2011   Procedure: ANKLE ARTHROSCOPY;  Surgeon: Colin Rhein, MD;  Location: [facility];  Service: Orthopedics;  Laterality: Right;  RIGHT ANKLE ARTHROSCOPY WITH EXTENSIVE DEBRIDEMENT   ANTERIOR CERVICAL DECOMP/DISCECTOMY FUSION N/A 10/04/2016   Procedure: CERVICAL FIVE-CERVICAL SIX  ANTERIOR CERVICAL DECOMPRESSION/DISCECTOMY/FUSION;  Surgeon: Erline Levine, MD;  Location: [place];  Service: Neurosurgery;  Laterality: N/A;  C5-6 ANTERIOR CERVICAL DECOMPRESSION/DISCECTOMY/FUSION   COLONOSCOPY     COLONOSCOPY N/A 03/02/2014   Procedure: COLONOSCOPY;  Surgeon: Cleotis Nipper, MD;  [facility]: [facility];  Service: Endoscopy;  Laterality: N/A;  ultra slim scope  DILATION AND CURETTAGE OF UTERUS     HEMORRHOID SURGERY       OB History   No obstetric history on file.     Family History  Problem Relation Age of Onset   Stroke Father    Hypertension Father    Glaucoma Mother     Social History   Tobacco Use   Smoking status: Passive Smoke Exposure - Never Smoker   Smokeless tobacco: Never  Substance Use Topics   Alcohol use:  No    Alcohol/week: 0.0 standard drinks   Drug use: No    Home Medications Prior to Admission medications   Medication Sig Start Date End Date Taking? Authorizing Provider  amoxicillin (AMOXIL) 500 MG capsule TAKE 4 CAPSULES BY MOUTH 1 HOURS PRIOR TO APPOINTMENT 12/12/20 12/12/21  Adornetto, Lucianne Lei, DMD  clobetasol ointment (TEMOVATE) 1.61 % APPLY 1 APPLICATION TOPICALLY 2 TIMES DAILY 12/16/20 12/16/21  Deirdre Pippins, PA-C  cycloSPORINE (RESTASIS) 0.05 % ophthalmic emulsion INSTILL 1 DROP IN BOTH EYES TWO TIMES DAILY 12/30/20 12/30/21  Warden Fillers, MD  diclofenac sodium (VOLTAREN) 1 % GEL Apply 2 g topically 4 (four) times daily as needed. 03/06/19   Biagio Borg, MD  estradiol (ESTRACE) 1 MG tablet Take 1 tablet (1 mg total) by mouth daily. 06/09/21     estradiol (ESTRACE) 2 MG tablet Take 2 mg by mouth daily. 07/29/20   [provider]  estradiol (ESTRACE) 2 MG tablet TAKE 1 TABLET BY MOUTH ONCE DAILY. 07/29/20 07/29/21  Maisie Fus, MD  ketorolac (ACULAR) 0.4 % SOLN Instill 1 drop into right eye four times a day STARTING 1 DAY BEFORE SURGERY 02/20/21     ketorolac (TORADOL) 10 MG tablet Take 1 tablet (10 mg total) by mouth every 8 (eight) hours as needed. 08/30/21   Scot Jun, FNP  Multiple Vitamins-Minerals (ALIVE WOMENS ENERGY) TABS Take 1 tablet by mouth daily.    [provider]  nitrofurantoin, macrocrystal-monohydrate, (MACROBID) 100 MG capsule Take 1 capsule (100 mg total) by mouth 2 (two) times daily for 3 days. 08/30/21 09/02/21  Scot Jun, FNP  ofloxacin (OCUFLOX) 0.3 % ophthalmic solution Instill 1 drop into right eye four times a day starting one day before surgery 02/20/21     omeprazole (PRILOSEC) 40 MG capsule Take 1 capsule (40 mg total) by mouth daily. 05/23/19   Zigmund Gottron, NP  pantoprazole (PROTONIX) 40 MG tablet Take 40 mg by mouth every morning. 01/22/20   [provider]  pantoprazole (PROTONIX) 40 MG tablet TAKE 1 TABLET  BY MOUTH TWO TIMES DAILY 11/14/20 11/14/21  Deliah Goody, PA-C  pantoprazole (PROTONIX) 40 MG tablet TAKE 1 TABLET ONCE A DAY IN THE MORNING - TAKE 30 MINUTES PRIOR TO BREAKFAST 11/14/20 11/14/21  Buccini, Herbie Baltimore, MD  prednisoLONE acetate (PRED FORTE) 1 % ophthalmic suspension Instill 1 drop into right eye four times a day starting after surgery 02/20/21     RESTASIS 0.05 % ophthalmic emulsion  12/28/19   [provider]  rosuvastatin (CRESTOR) 20 MG tablet Take 1 tablet (20 mg total) by mouth daily. 08/21/21   Biagio Borg, MD    Allergies    Bee venom, Oxycodone-acetaminophen, Propoxyphene, Orange juice Ha[place] Prophet oil], Oxycodone, and Wygesic [propoxyphene n-acetaminophen]  Review of Systems   Review of Systems  Respiratory:  Negative for cough, chest tightness and shortness of breath.   All other systems reviewed and are negative.  Physical Exam Updated Vital Signs BP 132/67  Pulse (!) 52   Temp 98.4 F (36.9 C)   Resp 17   SpO2 99%   Physical Exam Vitals and nursing note reviewed.  Constitutional:      General: She is not in acute distress.    Appearance: Normal appearance. She is not ill-appearing.  HENT:     Head: Normocephalic and atraumatic.     Nose: Nose normal.  Eyes:     General: No scleral icterus.    Extraocular Movements: Extraocular movements intact.     Conjunctiva/sclera: Conjunctivae normal.  Cardiovascular:     Rate and Rhythm: Regular rhythm.     Pulses: Normal pulses.  Pulmonary:     Effort: Pulmonary effort is normal. No respiratory distress.     Breath sounds: Normal breath sounds. No wheezing or rales.  Abdominal:     General: There is no distension.     Tenderness: There is no abdominal tenderness.  Musculoskeletal:        General: Normal range of motion.     Cervical back: Normal range of motion.     Right lower leg: No edema.     Left lower leg: No edema.  Skin:    General: Skin is warm and dry.  Neurological:     General: No focal  deficit present.     Mental Status: She is alert. Mental status is at baseline.    ED Results / Procedures / Treatments   Labs (all labs ordered are listed, but only abnormal results are displayed) Labs Reviewed  COMPREHENSIVE METABOLIC PANEL - Abnormal; Notable for the following components:      Result Value   Glucose, Bld 104 (*)    All other components within normal limits  CBC - Abnormal; Notable for the following components:   Platelets 148 (*)    All other components within normal limits  LIPASE, BLOOD  URINALYSIS, ROUTINE W REFLEX MICROSCOPIC  TROPONIN I (HIGH SENSITIVITY)  TROPONIN I (HIGH SENSITIVITY)    EKG EKG Interpretation  Date/Time:  Friday September 01 2021 08:22:15 EST Ventricular Rate:  67 PR Interval:  132 QRS Duration: 82 QT Interval:  396 QTC Calculation: 418 R Axis:   62 Text Interpretation: Normal sinus rhythm Normal ECG No significant change since prior 9/17 Confirmed by Aletta Edouard [phone]) on 09/01/2021 10:13:18 AM  Radiology DG Chest 2 View  Result Date: 09/01/2021 CLINICAL DATA:  Recent diagnosis of UTI with back pain and chest pain. EXAM: CHEST - 2 VIEW COMPARISON:  None. FINDINGS: The heart size and mediastinal contours are within normal limits. Both lungs are clear. A radiopaque fusion plate and screws are seen overlying the lower cervical spine. The visualized skeletal structures are unremarkable. IMPRESSION: No active cardiopulmonary disease. Electronically Signed   By: Virgina Norfolk M.D.   On: 09/01/2021 17:14    Procedures Procedures   Medications Ordered in ED Medications  ondansetron (ZOFRAN) injection 4 mg (has no administration in time range)  morphine 4 MG/ML injection 4 mg (has no administration in time range)    ED Course  I have reviewed the triage vital signs and the nursing notes.  Pertinent labs & imaging results that were available during my care of the patient were reviewed by me and considered in my medical  decision making (see chart for details).    MDM Rules/Calculators/A&P                           60 year old female  presents with left lower quadrant abdominal pain that radiates to her left flank as well as chest pain that is intermittent sharp for 1 week duration.  Patient was treated for UTI without improvement in her symptoms.  She also endorses rectal bleeding as of today.  Work-up so far significant for CBC which is unremarkable with the exception of platelets of 148, CMP which is unremarkable with the exception of glucose of 104.  Initial troponin of 5, lipase of 35.  Chest x-ray without acute cardial pulm process.  EKG with normal sinus rhythm and without acute ischemic changes.  Will obtain second troponin.  We will also order CT abdomen pelvis with contrast to evaluate for any acute intra-abdominal process.  Second troponin within normal limits.  CT abdomen pelvis with contrast without acute intra-abdominal processes.  There is some potential nonspecific colitis versus underdistention of the sigmoid colon.  There is no strong indication for treatment at this time.  She also has a simple right renal cyst.  She will follow-up with PCP regarding this. Patient's chest pain is unlikely to be ACS given the above work-up and history.  Patient does have history of acid reflux and decreased her PPI dose last year.  Unsure if this is the etiology but will increase her Protonix dose until she follows up with GI and PCP.  Patient is established with Dr. Cristina Gong from GI standpoint and will follow-up for her abdominal pain with him.  Patient has history of hemorrhoids.  Given that her CT abdomen pelvis was negative for acute process, stable hemoglobin will defer rectal exam next visit.  Patient is agreeable and will follow-up with GI regarding this.  Return precautions discussed.  Patient voices understanding and is in agreement with plan.  Final Clinical Impression(s) / ED Diagnoses Final diagnoses:  None     Rx / DC Orders ED Discharge Orders     None        Evlyn Courier, PA-C 09/01/21 2029    Evlyn Courier, PA-C 09/01/21 2049    Davonna Belling, MD 09/02/21 [phone]

## 2021-09-01 NOTE — ED Triage Notes (Signed)
Pt. Stated, Jillian Andrade had this stomach pressure right after I urinate for the past week.

## 2021-09-05 ENCOUNTER — Other Ambulatory Visit (HOSPITAL_COMMUNITY): Payer: Self-pay

## 2021-09-05 DIAGNOSIS — R1030 Lower abdominal pain, unspecified: Secondary | ICD-10-CM | POA: Diagnosis not present

## 2021-09-05 DIAGNOSIS — K529 Noninfective gastroenteritis and colitis, unspecified: Secondary | ICD-10-CM | POA: Diagnosis not present

## 2021-09-05 MED ORDER — HYOSCYAMINE SULFATE 0.125 MG PO TBDP
0.1250 mg | ORAL_TABLET | ORAL | 1 refills | Status: DC
  Filled 2021-09-05: qty 120, 20d supply, fill #0

## 2021-09-11 DIAGNOSIS — K529 Noninfective gastroenteritis and colitis, unspecified: Secondary | ICD-10-CM | POA: Diagnosis not present

## 2021-09-11 DIAGNOSIS — R1030 Lower abdominal pain, unspecified: Secondary | ICD-10-CM | POA: Diagnosis not present

## 2021-09-13 ENCOUNTER — Encounter: Payer: Self-pay | Admitting: Internal Medicine

## 2021-09-13 ENCOUNTER — Other Ambulatory Visit: Payer: Self-pay

## 2021-09-13 ENCOUNTER — Ambulatory Visit: Payer: 59 | Admitting: Internal Medicine

## 2021-09-13 VITALS — BP 118/80 | HR 62 | Resp 18 | Ht 64.0 in | Wt 197.0 lb

## 2021-09-13 DIAGNOSIS — R739 Hyperglycemia, unspecified: Secondary | ICD-10-CM | POA: Diagnosis not present

## 2021-09-13 DIAGNOSIS — E559 Vitamin D deficiency, unspecified: Secondary | ICD-10-CM | POA: Diagnosis not present

## 2021-09-13 DIAGNOSIS — E7800 Pure hypercholesterolemia, unspecified: Secondary | ICD-10-CM | POA: Diagnosis not present

## 2021-09-13 DIAGNOSIS — N281 Cyst of kidney, acquired: Secondary | ICD-10-CM | POA: Diagnosis not present

## 2021-09-13 DIAGNOSIS — R350 Frequency of micturition: Secondary | ICD-10-CM | POA: Diagnosis not present

## 2021-09-13 NOTE — Progress Notes (Signed)
Patient ID: Jillian Andrade, female   DOB: Nov 04, 1960, 60 y.o.   MRN: [id number]        Chief Complaint: follow up recent ED visit       HPI:  Jillian Andrade is a 59 y.o. female here after onset symptoms nov 16, seen in ED nov 18 with left low and mid abd pain and hematochezia; CT c/w distal colon sigmoid colitis, seen in f/u with GI and GI panel ordered, then to consider colonoscopy.  Also noted was large right renal cyst with c/o mild urinary frequency but Denies urinary symptoms such as dysuria, frequency, urgency, flank pain, hematuria or n/v, fever, chills.  Does have persistent nonspecific symptoms of HA, weakness, feels off balance at times and vertigo.  Did also have recent bilateral cataract surgury may and aug 2022, doing well.   Pt denies chest pain, increased sob or doe, wheezing, orthopnea, PND, increased LE swelling, palpitations, dizziness or syncope.  . Pt denies polydipsia, polyuria, or new focal neuro s/s.  Now taking Vit , trying to follow lower chol diet.         Wt Readings from Last 3 Encounters:  09/13/21 197 lb (89.4 kg)  09/16/20 189 lb (85.7 kg)  02/22/20 184 lb (83.5 kg)   BP Readings from Last 3 Encounters:  09/13/21 118/80  09/01/21 (!) 152/81  08/30/21 (!) 144/81         Past Medical History:  Diagnosis Date   Allergic rhinitis 01/15/4741   Complication of anesthesia    n/v   Fibroids in ovaries    pain in left side   GERD (gastroesophageal reflux disease)    no medications at this time   Hand osteomyelitis ([place]) 03/06/2019   Heart murmur    back in the 90s with the mitral valve prolapse   Insomnia 05/01/2016   Lumbar disc disease 05/01/2016   MVP (mitral valve prolapse) mild, no cardiologist   No pertinent past medical history    PONV (postoperative nausea and vomiting)    Past Surgical History:  Procedure Laterality Date   ABDOMINAL HYSTERECTOMY     ANKLE ARTHROSCOPY  10/31/2011   Procedure: ANKLE ARTHROSCOPY;  Surgeon: Colin Rhein, MD;   Location: [facility];  Service: Orthopedics;  Laterality: Right;  RIGHT ANKLE ARTHROSCOPY WITH EXTENSIVE DEBRIDEMENT   ANTERIOR CERVICAL DECOMP/DISCECTOMY FUSION N/A 10/04/2016   Procedure: CERVICAL FIVE-CERVICAL SIX  ANTERIOR CERVICAL DECOMPRESSION/DISCECTOMY/FUSION;  Surgeon: Erline Levine, MD;  Location: [facility];  Service: Neurosurgery;  Laterality: N/A;  C5-6 ANTERIOR CERVICAL DECOMPRESSION/DISCECTOMY/FUSION   COLONOSCOPY     COLONOSCOPY N/A 03/02/2014   Procedure: COLONOSCOPY;  Surgeon: Cleotis Nipper, MD;  Location: [facility];  Service: Endoscopy;  Laterality: N/A;  ultra slim scope   DILATION AND CURETTAGE OF UTERUS     HEMORRHOID SURGERY      reports that she is a non-smoker but has been exposed to tobacco smoke. She has never used smokeless tobacco. She reports that she does not drink alcohol and does not use drugs. family history includes Glaucoma in her mother; Hypertension in her father; Stroke in her father. Allergies  Allergen Reactions   Bee Venom Anaphylaxis   Oxycodone-Acetaminophen Hives   Propoxyphene Nausea And Vomiting    Allergy to Wygesic   Orange Juice [Orange Oil] Hives and Nausea And Vomiting   Oxycodone Hives   Wygesic [Propoxyphene N-Acetaminophen] Nausea And Vomiting   Current Outpatient Medications on File Prior to Visit  Medication Sig Dispense Refill  amoxicillin (AMOXIL) 500 MG capsule TAKE 4 CAPSULES BY MOUTH 1 HOURS PRIOR TO APPOINTMENT 12 capsule 2   clobetasol ointment (TEMOVATE) 9.50 % APPLY 1 APPLICATION TOPICALLY 2 TIMES DAILY 60 g 0   cycloSPORINE (RESTASIS) 0.05 % ophthalmic emulsion INSTILL 1 DROP IN BOTH EYES TWO TIMES DAILY 180 each 4   diclofenac sodium (VOLTAREN) 1 % GEL Apply 2 g topically 4 (four) times daily as needed. 200 g 11   estradiol (ESTRACE) 1 MG tablet Take 1 tablet (1 mg total) by mouth daily. 90 tablet 3   estradiol (ESTRACE) 2 MG tablet Take 2 mg by mouth daily.     hyoscyamine (ANASPAZ) 0.125 MG TBDP  disintergrating tablet Place 1 tablet on the tongue and allow to dissolve every 4 to 6 hours as needed 120 tablet 1   ketorolac (ACULAR) 0.4 % SOLN Instill 1 drop into right eye four times a day STARTING 1 DAY BEFORE SURGERY 5 mL 1   ketorolac (TORADOL) 10 MG tablet Take 1 tablet (10 mg total) by mouth every 8 (eight) hours as needed. 10 tablet 0   Multiple Vitamins-Minerals (ALIVE WOMENS ENERGY) TABS Take 1 tablet by mouth daily.     ofloxacin (OCUFLOX) 0.3 % ophthalmic solution Instill 1 drop into right eye four times a day starting one day before surgery 5 mL 1   omeprazole (PRILOSEC) 40 MG capsule Take 1 capsule (40 mg total) by mouth daily. 30 capsule 0   pantoprazole (PROTONIX) 40 MG tablet Take 40 mg by mouth every morning.     prednisoLONE acetate (PRED FORTE) 1 % ophthalmic suspension Instill 1 drop into right eye four times a day starting after surgery 10 mL 1   RESTASIS 0.05 % ophthalmic emulsion      rosuvastatin (CRESTOR) 20 MG tablet Take 1 tablet (20 mg total) by mouth daily. 90 tablet 3   estradiol (ESTRACE) 2 MG tablet TAKE 1 TABLET BY MOUTH ONCE DAILY. 30 tablet 11   No current facility-administered medications on file prior to visit.        ROS:  All others reviewed and negative.  Objective        PE:  BP 118/80   Pulse 62   Resp 18   Ht 5\' 4"  (1.626 m)   Wt 197 lb (89.4 kg)   SpO2 98%   BMI 33.81 kg/m                 Constitutional: Pt appears in NAD               HENT: Head: NCAT.                Right Ear: External ear normal.                 Left Ear: External ear normal.                Eyes: . Pupils are equal, round, and reactive to light. Conjunctivae and EOM are normal               Nose: without d/c or deformity               Neck: Neck supple. Gross normal ROM               Cardiovascular: Normal rate and regular rhythm.                 Pulmonary/Chest: Effort normal and breath sounds without rales or  wheezing.                Abd:  Soft,mild LLQ  tender,, ND, + BS, no organomegaly               Neurological: Pt is alert. At baseline orientation, motor grossly intact               Skin: Skin is warm. No rashes, no other new lesions, LE edema - none               Psychiatric: Pt behavior is normal without agitation   Micro: none  Cardiac tracings I have personally interpreted today:  none  Pertinent Radiological findings (summarize): none   Lab Results  Component Value Date   WBC 8.5 09/01/2021   HGB 13.2 09/01/2021   HCT 41.3 09/01/2021   PLT 148 (L) 09/01/2021   GLUCOSE 104 (H) 09/01/2021   CHOL 131 09/12/2020   TRIG 52.0 09/12/2020   HDL 64.00 09/12/2020   LDLCALC 57 09/12/2020   ALT 23 09/01/2021   AST 29 09/01/2021   NA 139 09/01/2021   K 4.0 09/01/2021   CL 109 09/01/2021   CREATININE 0.98 09/01/2021   BUN 9 09/01/2021   CO2 24 09/01/2021   TSH 0.82 09/12/2020   HGBA1C 6.0 09/12/2020   Assessment/Plan:  Jillian Andrade is a 60 y.o. Black or African American [2] female with  has a past medical history of Allergic rhinitis (4/80/1655), Complication of anesthesia, Fibroids (in ovaries ), GERD (gastroesophageal reflux disease), Hand osteomyelitis ([place]) (03/06/2019), Heart murmur, Insomnia (05/01/2016), Lumbar disc disease (05/01/2016), MVP (mitral valve prolapse) (mild, no cardiologist), No pertinent past medical history, and PONV (postoperative nausea and vomiting).  Vitamin D deficiency Last vitamin D Lab Results  Component Value Date   VD25OH 99.60 09/12/2020   Stable, cont oral replacement   HLD (hyperlipidemia) Lab Results  Component Value Date   LDLCALC 57 09/12/2020   Stable, pt to continue current statin crestor 20   Hyperglycemia Lab Results  Component Value Date   HGBA1C 6.0 09/12/2020   Stable, pt to continue current medical treatment  - diet, wt control, excercise   Renal cyst Large, doubt is cause of pt current symptoms but asks for urology referral,  to f/u any worsening symptoms or  concerns  Urinary frequency Mil duncontrolled, Recent ua neg, etiology unclear, for urology referral as well Followup: Return in about 12 days (around 09/25/2021).  Cathlean Cower, MD 09/13/2021 9:57 PM [facility]

## 2021-09-13 NOTE — Assessment & Plan Note (Signed)
Large, doubt is cause of pt current symptoms but asks for urology referral,  to f/u any worsening symptoms or concerns

## 2021-09-13 NOTE — Assessment & Plan Note (Signed)
Last vitamin D Lab Results  Component Value Date   VD25OH 99.60 09/12/2020   Stable, cont oral replacement

## 2021-09-13 NOTE — Progress Notes (Signed)
Patient ID: Jillian Andrade, female   DOB: Dec 21, 1960, 60 y.o.   MRN: [id number]

## 2021-09-13 NOTE — Assessment & Plan Note (Signed)
Lab Results  Component Value Date   HGBA1C 6.0 09/12/2020   Stable, pt to continue current medical treatment  - diet, wt control, excercise

## 2021-09-13 NOTE — Assessment & Plan Note (Signed)
Mil duncontrolled, Recent ua neg, etiology unclear, for urology referral as well

## 2021-09-13 NOTE — Patient Instructions (Signed)
Please continue all other medications as before, and refills have been done if requested.  Please have the pharmacy call with any other refills you may need.  Please continue your efforts at being more active, low cholesterol diet, and weight control.  Please keep your appointments with your specialists as you may have planned  You will be contacted regarding the referral for: Dr Alinda Money for the cyst and urinary frequency

## 2021-09-13 NOTE — Progress Notes (Deleted)
Patient ID: Jillian Andrade, female   DOB: 08/03/1961, 60 y.o.   MRN: [id number]         Chief Complaint:: wellness exam and Follow-up (Pt mentioned that she is still having pain but not as sharp. )  ***       HPI:  Jillian Andrade is a 60 y.o. female here for wellness exam                        Also***   Wt Readings from Last 3 Encounters:  09/13/21 197 lb (89.4 kg)  09/16/20 189 lb (85.7 kg)  02/22/20 184 lb (83.5 kg)   BP Readings from Last 3 Encounters:  09/13/21 118/80  09/01/21 (!) 152/81  08/30/21 (!) 144/81   Immunization History  Administered Date(s) Administered   Influenza,inj,Quad PF,6+ Mos 07/18/2015   Influenza-Unspecified 07/12/2017, 07/28/2018, 08/07/2020   PFIZER(Purple Top)SARS-COV-2 Vaccination 10/24/2019, 11/12/2019, 08/12/2020   Tdap 04/12/2015   Health Maintenance Due  Topic Date Due   Zoster Vaccines- Shingrix (1 of 2) Never done   COVID-19 Vaccine (4 - Booster for [employer] series) 10/07/2020   INFLUENZA VACCINE  05/15/2021      Past Medical History:  Diagnosis Date   Allergic rhinitis 04/26/4579   Complication of anesthesia    n/v   Fibroids in ovaries    pain in left side   GERD (gastroesophageal reflux disease)    no medications at this time   Hand osteomyelitis ([place]) 03/06/2019   Heart murmur    back in the 90s with the mitral valve prolapse   Insomnia 05/01/2016   Lumbar disc disease 05/01/2016   MVP (mitral valve prolapse) mild, no cardiologist   No pertinent past medical history    PONV (postoperative nausea and vomiting)    Past Surgical History:  Procedure Laterality Date   ABDOMINAL HYSTERECTOMY     ANKLE ARTHROSCOPY  10/31/2011   Procedure: ANKLE ARTHROSCOPY;  Surgeon: Colin Rhein, MD;  Location: [facility];  Service: Orthopedics;  Laterality: Right;  RIGHT ANKLE ARTHROSCOPY WITH EXTENSIVE DEBRIDEMENT   ANTERIOR CERVICAL DECOMP/DISCECTOMY FUSION N/A 10/04/2016   Procedure: CERVICAL FIVE-CERVICAL SIX  ANTERIOR  CERVICAL DECOMPRESSION/DISCECTOMY/FUSION;  Surgeon: Erline Levine, MD;  Location: [facility];  Service: Neurosurgery;  Laterality: N/A;  C5-6 ANTERIOR CERVICAL DECOMPRESSION/DISCECTOMY/FUSION   COLONOSCOPY     COLONOSCOPY N/A 03/02/2014   Procedure: COLONOSCOPY;  Surgeon: Cleotis Nipper, MD;  Location: [facility];  Service: Endoscopy;  Laterality: N/A;  ultra slim scope   DILATION AND CURETTAGE OF UTERUS     HEMORRHOID SURGERY      reports that she is a non-smoker but has been exposed to tobacco smoke. She has never used smokeless tobacco. She reports that she does not drink alcohol and does not use drugs. family history includes Glaucoma in her mother; Hypertension in her father; Stroke in her father. Allergies  Allergen Reactions   Bee Venom Anaphylaxis   Oxycodone-Acetaminophen Hives   Propoxyphene Nausea And Vomiting    Allergy to Wygesic   Orange Juice [Orange Oil] Hives and Nausea And Vomiting   Oxycodone Hives   Wygesic [Propoxyphene N-Acetaminophen] Nausea And Vomiting   Current Outpatient Medications on File Prior to Visit  Medication Sig Dispense Refill   amoxicillin (AMOXIL) 500 MG capsule TAKE 4 CAPSULES BY MOUTH 1 HOURS PRIOR TO APPOINTMENT 12 capsule 2   clobetasol ointment (TEMOVATE) 9.98 % APPLY 1 APPLICATION TOPICALLY 2 TIMES DAILY 60 g 0  cycloSPORINE (RESTASIS) 0.05 % ophthalmic emulsion INSTILL 1 DROP IN BOTH EYES TWO TIMES DAILY 180 each 4   diclofenac sodium (VOLTAREN) 1 % GEL Apply 2 g topically 4 (four) times daily as needed. 200 g 11   estradiol (ESTRACE) 1 MG tablet Take 1 tablet (1 mg total) by mouth daily. 90 tablet 3   estradiol (ESTRACE) 2 MG tablet Take 2 mg by mouth daily.     hyoscyamine (ANASPAZ) 0.125 MG TBDP disintergrating tablet Place 1 tablet on the tongue and allow to dissolve every 4 to 6 hours as needed 120 tablet 1   ketorolac (ACULAR) 0.4 % SOLN Instill 1 drop into right eye four times a day STARTING 1 DAY BEFORE SURGERY 5 mL 1   ketorolac  (TORADOL) 10 MG tablet Take 1 tablet (10 mg total) by mouth every 8 (eight) hours as needed. 10 tablet 0   Multiple Vitamins-Minerals (ALIVE WOMENS ENERGY) TABS Take 1 tablet by mouth daily.     ofloxacin (OCUFLOX) 0.3 % ophthalmic solution Instill 1 drop into right eye four times a day starting one day before surgery 5 mL 1   omeprazole (PRILOSEC) 40 MG capsule Take 1 capsule (40 mg total) by mouth daily. 30 capsule 0   pantoprazole (PROTONIX) 40 MG tablet Take 40 mg by mouth every morning.     prednisoLONE acetate (PRED FORTE) 1 % ophthalmic suspension Instill 1 drop into right eye four times a day starting after surgery 10 mL 1   RESTASIS 0.05 % ophthalmic emulsion      rosuvastatin (CRESTOR) 20 MG tablet Take 1 tablet (20 mg total) by mouth daily. 90 tablet 3   estradiol (ESTRACE) 2 MG tablet TAKE 1 TABLET BY MOUTH ONCE DAILY. 30 tablet 11   No current facility-administered medications on file prior to visit.        ROS:  All others reviewed and negative.  Objective        PE:  BP 118/80   Pulse 62   Resp 18   Ht 5\' 4"  (1.626 m)   Wt 197 lb (89.4 kg)   SpO2 98%   BMI 33.81 kg/m                 Constitutional: Pt appears in NAD               HENT: Head: NCAT.                Right Ear: External ear normal.                 Left Ear: External ear normal.                Eyes: . Pupils are equal, round, and reactive to light. Conjunctivae and EOM are normal               Nose: without d/c or deformity               Neck: Neck supple. Gross normal ROM               Cardiovascular: Normal rate and regular rhythm.                 Pulmonary/Chest: Effort normal and breath sounds without rales or wheezing.                Abd:  Soft, NT, ND, + BS, no organomegaly  Neurological: Pt is alert. At baseline orientation, motor grossly intact               Skin: Skin is warm. No rashes, no other new lesions, LE edema - ***               Psychiatric: Pt behavior is normal without  agitation   Micro: none  Cardiac tracings I have personally interpreted today:  none  Pertinent Radiological findings (summarize): none   Lab Results  Component Value Date   WBC 8.5 09/01/2021   HGB 13.2 09/01/2021   HCT 41.3 09/01/2021   PLT 148 (L) 09/01/2021   GLUCOSE 104 (H) 09/01/2021   CHOL 131 09/12/2020   TRIG 52.0 09/12/2020   HDL 64.00 09/12/2020   LDLCALC 57 09/12/2020   ALT 23 09/01/2021   AST 29 09/01/2021   NA 139 09/01/2021   K 4.0 09/01/2021   CL 109 09/01/2021   CREATININE 0.98 09/01/2021   BUN 9 09/01/2021   CO2 24 09/01/2021   TSH 0.82 09/12/2020   HGBA1C 6.0 09/12/2020   Assessment/Plan:  Jillian Andrade is a 59 y.o. Black or African American [2] female with  has a past medical history of Allergic rhinitis (4/32/7614), Complication of anesthesia, Fibroids (in ovaries ), GERD (gastroesophageal reflux disease), Hand osteomyelitis ([place]) (03/06/2019), Heart murmur, Insomnia (05/01/2016), Lumbar disc disease (05/01/2016), MVP (mitral valve prolapse) (mild, no cardiologist), No pertinent past medical history, and PONV (postoperative nausea and vomiting).  No problem-specific Assessment & Plan notes found for this encounter.  Followup: No follow-ups on file.  Cathlean Cower, MD 09/13/2021 2:34 PM [facility]

## 2021-09-13 NOTE — Assessment & Plan Note (Signed)
Lab Results  Component Value Date   LDLCALC 57 09/12/2020   Stable, pt to continue current statin crestor 20

## 2021-09-20 ENCOUNTER — Other Ambulatory Visit: Payer: Self-pay | Admitting: Gastroenterology

## 2021-09-20 ENCOUNTER — Encounter: Payer: Self-pay | Admitting: Urology

## 2021-09-20 DIAGNOSIS — R3982 Chronic bladder pain: Secondary | ICD-10-CM | POA: Diagnosis not present

## 2021-09-20 DIAGNOSIS — R350 Frequency of micturition: Secondary | ICD-10-CM | POA: Diagnosis not present

## 2021-09-20 DIAGNOSIS — N281 Cyst of kidney, acquired: Secondary | ICD-10-CM | POA: Diagnosis not present

## 2021-09-22 ENCOUNTER — Other Ambulatory Visit (INDEPENDENT_AMBULATORY_CARE_PROVIDER_SITE_OTHER): Payer: 59

## 2021-09-22 ENCOUNTER — Telehealth: Payer: Self-pay

## 2021-09-22 DIAGNOSIS — E785 Hyperlipidemia, unspecified: Secondary | ICD-10-CM

## 2021-09-22 DIAGNOSIS — E559 Vitamin D deficiency, unspecified: Secondary | ICD-10-CM

## 2021-09-22 DIAGNOSIS — R739 Hyperglycemia, unspecified: Secondary | ICD-10-CM | POA: Diagnosis not present

## 2021-09-22 LAB — URINALYSIS, ROUTINE W REFLEX MICROSCOPIC
Bilirubin Urine: NEGATIVE
Hgb urine dipstick: NEGATIVE
Ketones, ur: NEGATIVE
Leukocytes,Ua: NEGATIVE
Nitrite: NEGATIVE
Specific Gravity, Urine: 1.025 (ref 1.000–1.030)
Total Protein, Urine: NEGATIVE
Urine Glucose: NEGATIVE
Urobilinogen, UA: 0.2 (ref 0.0–1.0)
pH: 5.5 (ref 5.0–8.0)

## 2021-09-22 LAB — CBC WITH DIFFERENTIAL/PLATELET
Basophils Absolute: 0 10*3/uL (ref 0.0–0.1)
Basophils Relative: 0.5 % (ref 0.0–3.0)
Eosinophils Absolute: 0.1 10*3/uL (ref 0.0–0.7)
Eosinophils Relative: 2.2 % (ref 0.0–5.0)
HCT: 40.2 % (ref 36.0–46.0)
Hemoglobin: 13.6 g/dL (ref 12.0–15.0)
Lymphocytes Relative: 29.6 % (ref 12.0–46.0)
Lymphs Abs: 1.6 10*3/uL (ref 0.7–4.0)
MCHC: 34 g/dL (ref 30.0–36.0)
MCV: 88.5 fl (ref 78.0–100.0)
Monocytes Absolute: 0.4 10*3/uL (ref 0.1–1.0)
Monocytes Relative: 6.7 % (ref 3.0–12.0)
Neutro Abs: 3.3 10*3/uL (ref 1.4–7.7)
Neutrophils Relative %: 61 % (ref 43.0–77.0)
Platelets: 119 10*3/uL — ABNORMAL LOW (ref 150.0–400.0)
RBC: 4.54 Mil/uL (ref 3.87–5.11)
RDW: 13.5 % (ref 11.5–15.5)
WBC: 5.5 10*3/uL (ref 4.0–10.5)

## 2021-09-22 LAB — BASIC METABOLIC PANEL
BUN: 12 mg/dL (ref 6–23)
CO2: 27 mEq/L (ref 19–32)
Calcium: 9.7 mg/dL (ref 8.4–10.5)
Chloride: 105 mEq/L (ref 96–112)
Creatinine, Ser: 0.94 mg/dL (ref 0.40–1.20)
GFR: 67 mL/min (ref >60.00)
Glucose, Bld: 83 mg/dL (ref 70–99)
Potassium: 4.1 mEq/L (ref 3.5–5.1)
Sodium: 140 mEq/L (ref 135–145)

## 2021-09-22 LAB — HEPATIC FUNCTION PANEL
ALT: 25 U/L (ref 0–35)
AST: 28 U/L (ref 0–37)
Albumin: 4.4 g/dL (ref 3.5–5.2)
Alkaline Phosphatase: 52 U/L (ref 39–117)
Bilirubin, Direct: 0.2 mg/dL (ref 0.0–0.3)
Total Bilirubin: 0.7 mg/dL (ref 0.2–1.2)
Total Protein: 7.4 g/dL (ref 6.0–8.3)

## 2021-09-22 LAB — LIPID PANEL
Cholesterol: 134 mg/dL (ref 0–200)
HDL: 58.7 mg/dL (ref >39.00)
LDL Cholesterol: 60 mg/dL (ref 0–99)
NonHDL: 75.67
Total CHOL/HDL Ratio: 2
Triglycerides: 76 mg/dL (ref 0.0–149.0)
VLDL: 15.2 mg/dL (ref 0.0–40.0)

## 2021-09-22 LAB — TSH: TSH: 0.78 u[IU]/mL (ref 0.35–5.50)

## 2021-09-22 LAB — HEMOGLOBIN A1C: Hgb A1c MFr Bld: 6.1 % (ref 4.6–6.5)

## 2021-09-22 LAB — VITAMIN D 25 HYDROXY (VIT D DEFICIENCY, FRACTURES): VITD: 110.26 ng/mL (ref 30.00–100.00)

## 2021-09-22 NOTE — Telephone Encounter (Signed)
Hope from [facility] called and stated that the patient had a critical vitamin D 110.26.  Called taken at 11:15.

## 2021-09-25 ENCOUNTER — Ambulatory Visit (INDEPENDENT_AMBULATORY_CARE_PROVIDER_SITE_OTHER): Payer: 59 | Admitting: Internal Medicine

## 2021-09-25 ENCOUNTER — Other Ambulatory Visit: Payer: Self-pay

## 2021-09-25 ENCOUNTER — Encounter: Payer: Self-pay | Admitting: Internal Medicine

## 2021-09-25 ENCOUNTER — Other Ambulatory Visit (HOSPITAL_COMMUNITY): Payer: Self-pay

## 2021-09-25 VITALS — BP 104/60 | HR 60 | Temp 98.9°F | Ht 64.0 in | Wt 197.0 lb

## 2021-09-25 DIAGNOSIS — D696 Thrombocytopenia, unspecified: Secondary | ICD-10-CM | POA: Diagnosis not present

## 2021-09-25 DIAGNOSIS — E7800 Pure hypercholesterolemia, unspecified: Secondary | ICD-10-CM

## 2021-09-25 DIAGNOSIS — Z0001 Encounter for general adult medical examination with abnormal findings: Secondary | ICD-10-CM

## 2021-09-25 DIAGNOSIS — E559 Vitamin D deficiency, unspecified: Secondary | ICD-10-CM

## 2021-09-25 DIAGNOSIS — M5442 Lumbago with sciatica, left side: Secondary | ICD-10-CM

## 2021-09-25 DIAGNOSIS — R739 Hyperglycemia, unspecified: Secondary | ICD-10-CM | POA: Diagnosis not present

## 2021-09-25 MED ORDER — ZOSTER VAC RECOMB ADJUVANTED 50 MCG/0.5ML IM SUSR
INTRAMUSCULAR | 1 refills | Status: DC
  Filled 2021-09-25: qty 0.5, 1d supply, fill #0
  Filled 2021-12-07 – 2021-12-08 (×2): qty 0.5, 1d supply, fill #1
  Filled ????-??-??: fill #1

## 2021-09-25 MED ORDER — CYCLOBENZAPRINE HCL 5 MG PO TABS
5.0000 mg | ORAL_TABLET | Freq: Three times a day (TID) | ORAL | 2 refills | Status: DC | PRN
  Filled 2021-09-25: qty 40, 14d supply, fill #0
  Filled 2021-11-09: qty 40, 14d supply, fill #1

## 2021-09-25 NOTE — Progress Notes (Signed)
Patient ID: Jillian Andrade, female   DOB: July 12, 1961, 60 y.o.   MRN: [id number]         Chief Complaint:: wellness exam and hyperglycemia, low vit d, TCP and left lower back pain       HPI:  Jillian Andrade is a 60 y.o. female here for wellness exam; declines covid booster and shignrx, mammogram is scheduled and o/w up to date                Also Pt denies chest pain, increased sob or doe, wheezing, orthopnea, PND, increased LE swelling, palpitations, dizziness or syncope.   Pt denies polydipsia, polyuria, or new focal neuro s/s.   Pt denies fever, wt loss, night sweats, loss of appetite, or other constitutional symptoms  Has been taking Vit D regularly.  No recent worsening overt bleeding or bruising.  Pt c/o incidental onset mild left lower back pain, sharp, but no bowel or bladder change, fever, wt loss,  worsening LE pain/numbness/weakness, gait change or falls; worse to stand up or walk, better to sit or lie down.  No other new complaints   Wt Readings from Last 3 Encounters:  09/25/21 197 lb (89.4 kg)  09/13/21 197 lb (89.4 kg)  09/16/20 189 lb (85.7 kg)   BP Readings from Last 3 Encounters:  09/25/21 104/60  09/13/21 118/80  09/01/21 (!) 152/81   Immunization History  Administered Date(s) Administered   Influenza,inj,Quad PF,6+ Mos 07/18/2015   Influenza-Unspecified 07/12/2017, 07/28/2018, 08/07/2020, 07/31/2021   PFIZER(Purple Top)SARS-COV-2 Vaccination 10/24/2019, 11/12/2019, 08/12/2020   Tdap 04/12/2015  There are no preventive care reminders to display for this patient.    Past Medical History:  Diagnosis Date   Allergic rhinitis 2/40/9735   Complication of anesthesia    n/v   Fibroids in ovaries    pain in left side   GERD (gastroesophageal reflux disease)    no medications at this time   Hand osteomyelitis ([place]) 03/06/2019   Heart murmur    back in the 90s with the mitral valve prolapse   Insomnia 05/01/2016   Lumbar disc disease 05/01/2016   MVP (mitral valve  prolapse) mild, no cardiologist   No pertinent past medical history    PONV (postoperative nausea and vomiting)    Past Surgical History:  Procedure Laterality Date   ABDOMINAL HYSTERECTOMY     ANKLE ARTHROSCOPY  10/31/2011   Procedure: ANKLE ARTHROSCOPY;  Surgeon: Colin Rhein, MD;  Location: [facility];  Service: Orthopedics;  Laterality: Right;  RIGHT ANKLE ARTHROSCOPY WITH EXTENSIVE DEBRIDEMENT   ANTERIOR CERVICAL DECOMP/DISCECTOMY FUSION N/A 10/04/2016   Procedure: CERVICAL FIVE-CERVICAL SIX  ANTERIOR CERVICAL DECOMPRESSION/DISCECTOMY/FUSION;  Surgeon: Erline Levine, MD;  Location: [facility];  Service: Neurosurgery;  Laterality: N/A;  C5-6 ANTERIOR CERVICAL DECOMPRESSION/DISCECTOMY/FUSION   COLONOSCOPY     COLONOSCOPY N/A 03/02/2014   Procedure: COLONOSCOPY;  Surgeon: Cleotis Nipper, MD;  Location: [facility];  Service: Endoscopy;  Laterality: N/A;  ultra slim scope   DILATION AND CURETTAGE OF UTERUS     HEMORRHOID SURGERY      reports that she has never smoked. She has been exposed to tobacco smoke. She has never used smokeless tobacco. She reports that she does not drink alcohol and does not use drugs. family history includes Glaucoma in her mother; Hypertension in her father; Stroke in her father. Allergies  Allergen Reactions   Bee Venom Anaphylaxis   Oxycodone-Acetaminophen Hives   Propoxyphene Nausea And Vomiting    Allergy  to Wygesic   [employer] [Orange Oil] Hives and Nausea And Vomiting   Oxycodone Hives   Wygesic [Propoxyphene N-Acetaminophen] Nausea And Vomiting   Current Outpatient Medications on File Prior to Visit  Medication Sig Dispense Refill   amoxicillin (AMOXIL) 500 MG capsule TAKE 4 CAPSULES BY MOUTH 1 HOURS PRIOR TO APPOINTMENT 12 capsule 2   clobetasol ointment (TEMOVATE) 2.42 % APPLY 1 APPLICATION TOPICALLY 2 TIMES DAILY 60 g 0   cycloSPORINE (RESTASIS) 0.05 % ophthalmic emulsion INSTILL 1 DROP IN BOTH EYES TWO TIMES DAILY 180 each 4    diclofenac sodium (VOLTAREN) 1 % GEL Apply 2 g topically 4 (four) times daily as needed. 200 g 11   estradiol (ESTRACE) 1 MG tablet Take 1 tablet (1 mg total) by mouth daily. 90 tablet 3   estradiol (ESTRACE) 2 MG tablet Take 2 mg by mouth daily.     hyoscyamine (ANASPAZ) 0.125 MG TBDP disintergrating tablet Place 1 tablet on the tongue and allow to dissolve every 4 to 6 hours as needed 120 tablet 1   ketorolac (ACULAR) 0.4 % SOLN Instill 1 drop into right eye four times a day STARTING 1 DAY BEFORE SURGERY 5 mL 1   ketorolac (TORADOL) 10 MG tablet Take 1 tablet (10 mg total) by mouth every 8 (eight) hours as needed. 10 tablet 0   Multiple Vitamins-Minerals (ALIVE WOMENS ENERGY) TABS Take 1 tablet by mouth daily.     omeprazole (PRILOSEC) 40 MG capsule Take 1 capsule (40 mg total) by mouth daily. 30 capsule 0   pantoprazole (PROTONIX) 40 MG tablet Take 40 mg by mouth every morning.     RESTASIS 0.05 % ophthalmic emulsion      rosuvastatin (CRESTOR) 20 MG tablet Take 1 tablet (20 mg total) by mouth daily. 90 tablet 3   estradiol (ESTRACE) 2 MG tablet TAKE 1 TABLET BY MOUTH ONCE DAILY. 30 tablet 11   No current facility-administered medications on file prior to visit.        ROS:  All others reviewed and negative.  Objective        PE:  BP 104/60 (BP Location: Right Arm, Patient Position: Sitting, Cuff Size: Large)   Pulse 60   Temp 98.9 F (37.2 C) (Oral)   Ht 5\' 4"  (1.626 m)   Wt 197 lb (89.4 kg)   SpO2 98%   BMI 33.81 kg/m                 Constitutional: Pt appears in NAD               HENT: Head: NCAT.                Right Ear: External ear normal.                 Left Ear: External ear normal.                Eyes: . Pupils are equal, round, and reactive to light. Conjunctivae and EOM are normal               Nose: without d/c or deformity               Neck: Neck supple. Gross normal ROM               Cardiovascular: Normal rate and regular rhythm.                  Pulmonary/Chest: Effort normal and  breath sounds without rales or wheezing.                Abd:  Soft, NT, ND, + BS, no organomegaly; no flank tender but has low left lumbar paravertebral tender spasm mild               Neurological: Pt is alert. At baseline orientation, motor grossly intact               Skin: Skin is warm. No rashes, no other new lesions, LE edema - none               Psychiatric: Pt behavior is normal without agitation   Micro: none  Cardiac tracings I have personally interpreted today:  none  Pertinent Radiological findings (summarize): none   Lab Results  Component Value Date   WBC 5.5 09/22/2021   HGB 13.6 09/22/2021   HCT 40.2 09/22/2021   PLT 119.0 Repeated and verified X2. (L) 09/22/2021   GLUCOSE 83 09/22/2021   CHOL 134 09/22/2021   TRIG 76.0 09/22/2021   HDL 58.70 09/22/2021   LDLCALC 60 09/22/2021   ALT 25 09/22/2021   AST 28 09/22/2021   NA 140 09/22/2021   K 4.1 09/22/2021   CL 105 09/22/2021   CREATININE 0.94 09/22/2021   BUN 12 09/22/2021   CO2 27 09/22/2021   TSH 0.78 09/22/2021   HGBA1C 6.1 09/22/2021   Assessment/Plan:  Jillian Andrade is a 60 y.o. Black or African American [2] female with  has a past medical history of Allergic rhinitis (9/52/8413), Complication of anesthesia, Fibroids (in ovaries ), GERD (gastroesophageal reflux disease), Hand osteomyelitis ([place]) (03/06/2019), Heart murmur, Insomnia (05/01/2016), Lumbar disc disease (05/01/2016), MVP (mitral valve prolapse) (mild, no cardiologist), No pertinent past medical history, and PONV (postoperative nausea and vomiting).  Vitamin D deficiency Last vitamin D Lab Results  Component Value Date   VD25OH 110.26 ([facility]) 09/22/2021   overcontrolled, ok cont oral replacement at lower dose 2000 units per day ( from 4000)  HLD (hyperlipidemia) Lab Results  Component Value Date   LDLCALC 60 09/22/2021   Stable, pt to continue current statin crestor   Hyperglycemia Lab Results   Component Value Date   HGBA1C 6.1 09/22/2021   Stable, pt to continue current medical treatment  - diet   Encounter for well adult exam with abnormal findings Age and sex appropriate education and counseling updated with regular exercise and diet Referrals for preventative services - has scheduled mammogram Immunizations addressed - declines covid booster and shingrix Smoking counseling  - none needed Evidence for depression or other mood disorder - none significant Most recent labs reviewed. I have personally reviewed and have noted: 1) the patient's medical and social history 2) The patient's current medications and supplements 3) The patient's height, weight, and BMI have been recorded in the chart   Thrombocytopenia ([place]) With sudden mild worsening it seems, no overt bleeding or bruising, but will need to recheck platelets monthly for 3 mo, consider heme/onc referral  Lower back pain Acute on chronic recurring, c/w msk spasm today - for flexeril 5 tid prn,  to f/u any worsening symptoms or concerns  Followup: No follow-ups on file.  Cathlean Cower, MD 09/28/2021 5:00 AM [facility]

## 2021-09-25 NOTE — Assessment & Plan Note (Signed)
Lab Results  Component Value Date   HGBA1C 6.1 09/22/2021   Stable, pt to continue current medical treatment  - diet

## 2021-09-25 NOTE — Assessment & Plan Note (Signed)
Lab Results  Component Value Date   LDLCALC 60 09/22/2021   Stable, pt to continue current statin crestor

## 2021-09-25 NOTE — Patient Instructions (Addendum)
Ok to cut back on the Vitamin D to 2000 u per day  Please take all new medication as prescribed - the muscle relaxer as needed  Please continue all other medications as before, and refills have been done if requested.  Please have the pharmacy call with any other refills you may need.  Please continue your efforts at being more active, low cholesterol diet, and weight control.  You are otherwise up to date with prevention measures today.  Please keep your appointments with your specialists as you may have planned  Please make an Appointment to return in 6 months, or sooner if needed, also with Lab Appointment for testing done 3-5 days before at the [facility] (so this is for TWO appointments - please see the scheduling desk as you leave)  Due to the ongoing Covid 19 pandemic, our lab now requires an appointment for any labs done at our office.  If you need labs done and do not have an appointment, please call our office ahead of time to schedule before presenting to the lab for your testing.

## 2021-09-25 NOTE — Assessment & Plan Note (Addendum)
Last vitamin D Lab Results  Component Value Date   VD25OH 110.26 ([facility]) 09/22/2021   overcontrolled, ok cont oral replacement at lower dose 2000 units per day ( from 4000)

## 2021-09-28 NOTE — Assessment & Plan Note (Signed)
Acute on chronic recurring, c/w msk spasm today - for flexeril 5 tid prn,  to f/u any worsening symptoms or concerns

## 2021-09-28 NOTE — Assessment & Plan Note (Signed)
With sudden mild worsening it seems, no overt bleeding or bruising, but will need to recheck platelets monthly for 3 mo, consider heme/onc referral

## 2021-09-28 NOTE — Assessment & Plan Note (Signed)
Age and sex appropriate education and counseling updated with regular exercise and diet Referrals for preventative services - has scheduled mammogram Immunizations addressed - declines covid booster and shingrix Smoking counseling  - none needed Evidence for depression or other mood disorder - none significant Most recent labs reviewed. I have personally reviewed and have noted: 1) the patient's medical and social history 2) The patient's current medications and supplements 3) The patient's height, weight, and BMI have been recorded in the chart

## 2021-09-29 ENCOUNTER — Other Ambulatory Visit (HOSPITAL_COMMUNITY): Payer: Self-pay

## 2021-09-29 ENCOUNTER — Ambulatory Visit
Admission: RE | Admit: 2021-09-29 | Discharge: 2021-09-29 | Disposition: A | Discharge status: Home / Self Care | Payer: 59 | Source: Ambulatory Visit | Attending: Obstetrics & Gynecology | Admitting: Obstetrics & Gynecology

## 2021-09-29 DIAGNOSIS — Z1231 Encounter for screening mammogram for malignant neoplasm of breast: Secondary | ICD-10-CM | POA: Diagnosis not present

## 2021-10-02 ENCOUNTER — Other Ambulatory Visit (HOSPITAL_COMMUNITY): Payer: Self-pay

## 2021-11-09 ENCOUNTER — Other Ambulatory Visit (HOSPITAL_COMMUNITY): Payer: Self-pay

## 2021-11-14 ENCOUNTER — Encounter (HOSPITAL_COMMUNITY): Payer: Self-pay | Admitting: Gastroenterology

## 2021-11-16 ENCOUNTER — Other Ambulatory Visit: Payer: Self-pay | Admitting: Gastroenterology

## 2021-11-20 ENCOUNTER — Other Ambulatory Visit (HOSPITAL_COMMUNITY): Payer: Self-pay

## 2021-11-20 MED ORDER — PEG 3350-KCL-NA BICARB-NACL 420 G PO SOLR
ORAL | 0 refills | Status: DC
  Filled 2021-11-20: qty 4000, 1d supply, fill #0

## 2021-11-23 ENCOUNTER — Encounter (HOSPITAL_COMMUNITY): Payer: Self-pay | Admitting: Gastroenterology

## 2021-11-23 ENCOUNTER — Ambulatory Visit (HOSPITAL_COMMUNITY)
Admission: RE | Admit: 2021-11-23 | Discharge: 2021-11-23 | Disposition: A | Discharge status: Home / Self Care | Payer: 59 | Attending: Gastroenterology | Admitting: Gastroenterology

## 2021-11-23 ENCOUNTER — Encounter (HOSPITAL_COMMUNITY)
Admission: RE | Disposition: A | Discharge status: Home / Self Care | Payer: Self-pay | Source: Home / Self Care | Attending: Gastroenterology

## 2021-11-23 ENCOUNTER — Other Ambulatory Visit (HOSPITAL_COMMUNITY): Payer: Self-pay

## 2021-11-23 ENCOUNTER — Other Ambulatory Visit: Payer: Self-pay

## 2021-11-23 ENCOUNTER — Ambulatory Visit (HOSPITAL_COMMUNITY): Payer: 59 | Admitting: Certified Registered"

## 2021-11-23 ENCOUNTER — Ambulatory Visit (HOSPITAL_BASED_OUTPATIENT_CLINIC_OR_DEPARTMENT_OTHER): Payer: 59 | Admitting: Certified Registered"

## 2021-11-23 DIAGNOSIS — K529 Noninfective gastroenteritis and colitis, unspecified: Secondary | ICD-10-CM | POA: Diagnosis not present

## 2021-11-23 DIAGNOSIS — K6389 Other specified diseases of intestine: Secondary | ICD-10-CM

## 2021-11-23 DIAGNOSIS — K640 First degree hemorrhoids: Secondary | ICD-10-CM

## 2021-11-23 DIAGNOSIS — M1990 Unspecified osteoarthritis, unspecified site: Secondary | ICD-10-CM

## 2021-11-23 DIAGNOSIS — K625 Hemorrhage of anus and rectum: Secondary | ICD-10-CM | POA: Diagnosis not present

## 2021-11-23 DIAGNOSIS — R195 Other fecal abnormalities: Secondary | ICD-10-CM | POA: Diagnosis not present

## 2021-11-23 DIAGNOSIS — K219 Gastro-esophageal reflux disease without esophagitis: Secondary | ICD-10-CM | POA: Diagnosis not present

## 2021-11-23 DIAGNOSIS — R197 Diarrhea, unspecified: Secondary | ICD-10-CM | POA: Diagnosis not present

## 2021-11-23 HISTORY — PX: BIOPSY: SHX5522

## 2021-11-23 HISTORY — PX: COLONOSCOPY WITH PROPOFOL: SHX5780

## 2021-11-23 SURGERY — COLONOSCOPY WITH PROPOFOL
Anesthesia: Monitor Anesthesia Care

## 2021-11-23 MED ORDER — PROPOFOL 500 MG/50ML IV EMUL
INTRAVENOUS | Status: DC | PRN
  Administered 2021-11-23: 128 ug/kg/min via INTRAVENOUS

## 2021-11-23 MED ORDER — LACTATED RINGERS IV SOLN: INTRAVENOUS | Status: DC

## 2021-11-23 MED ORDER — DICYCLOMINE HCL 20 MG PO TABS
20.0000 mg | ORAL_TABLET | Freq: Three times a day (TID) | ORAL | 3 refills | Status: DC
  Filled 2021-11-23: qty 90, 30d supply, fill #0

## 2021-11-23 MED ORDER — LIDOCAINE 2% (20 MG/ML) 5 ML SYRINGE
INTRAMUSCULAR | Status: DC | PRN
Start: 2021-11-23 — End: 2021-11-23
  Administered 2021-11-23: 40 mg via INTRAVENOUS

## 2021-11-23 MED ORDER — PROPOFOL 10 MG/ML IV BOLUS
INTRAVENOUS | Status: DC | PRN
  Administered 2021-11-23: 20 mg via INTRAVENOUS

## 2021-11-23 MED ORDER — EPHEDRINE SULFATE-NACL 50-0.9 MG/10ML-% IV SOSY
PREFILLED_SYRINGE | INTRAVENOUS | Status: DC | PRN
  Administered 2021-11-23: 5 mg via INTRAVENOUS

## 2021-11-23 MED ORDER — PROPOFOL 1000 MG/100ML IV EMUL
INTRAVENOUS | Status: AC
  Filled 2021-11-23: qty 100

## 2021-11-23 SURGICAL SUPPLY — 22 items

## 2021-11-23 NOTE — H&P (Signed)
Date of Initial H&P: 11/16/21  History reviewed, patient examined, no change in status, stable for surgery.

## 2021-11-23 NOTE — Anesthesia Procedure Notes (Signed)
Procedure Name: MAC Date/Time: 11/23/2021 12:24 PM Performed by: Eben Burow, CRNA Pre-anesthesia Checklist: Patient identified, Emergency Drugs available, Suction available, Patient being monitored and Timeout performed Oxygen Delivery Method: Simple face mask Placement Confirmation: positive ETCO2

## 2021-11-23 NOTE — Anesthesia Preprocedure Evaluation (Addendum)
Anesthesia Evaluation  Patient identified by MRN, date of birth, ID band Patient awake    Reviewed: Allergy & Precautions, NPO status , Patient's Chart, lab work & pertinent test results  History of Anesthesia Complications (+) PONV and history of anesthetic complications  Airway Mallampati: I  TM Distance: >3 FB Neck ROM: Full    Dental no notable dental hx.    Pulmonary neg pulmonary ROS,    Pulmonary exam normal        Cardiovascular negative cardio ROS Normal cardiovascular exam     Neuro/Psych negative neurological ROS  negative psych ROS   GI/Hepatic Neg liver ROS, GERD  Controlled,loose stools/ rectal bleeding   Endo/Other  negative endocrine ROS  Renal/GU negative Renal ROS  negative genitourinary   Musculoskeletal  (+) Arthritis ,   Abdominal   Peds  Hematology negative hematology ROS (+)   Anesthesia Other Findings Day of surgery medications reviewed with patient.  Reproductive/Obstetrics negative OB ROS                            Anesthesia Physical Anesthesia Plan  ASA: 2  Anesthesia Plan: MAC   Post-op Pain Management: Minimal or no pain anticipated   Induction:   PONV Risk Score and Plan: 3 and Treatment may vary due to age or medical condition and Propofol infusion  Airway Management Planned: Natural Airway and Simple Face Mask  Additional Equipment: None  Intra-op Plan:   Post-operative Plan:   Informed Consent: I have reviewed the patients History and Physical, chart, labs and discussed the procedure including the risks, benefits and alternatives for the proposed anesthesia with the patient or authorized representative who has indicated his/her understanding and acceptance.       Plan Discussed with: CRNA  Anesthesia Plan Comments:        Anesthesia Quick Evaluation

## 2021-11-23 NOTE — Anesthesia Postprocedure Evaluation (Signed)
Anesthesia Post Note  Patient: Jillian Andrade  Procedure(s) Performed: COLONOSCOPY WITH PROPOFOL BIOPSY     Patient location during evaluation: PACU Anesthesia Type: MAC Level of consciousness: awake and alert Pain management: pain level controlled Vital Signs Assessment: post-procedure vital signs reviewed and stable Respiratory status: spontaneous breathing, nonlabored ventilation and respiratory function stable Cardiovascular status: blood pressure returned to baseline Postop Assessment: no apparent nausea or vomiting Anesthetic complications: no   No notable events documented.  Last Vitals:  Vitals:   11/23/21 1253 11/23/21 1303  BP: (!) 101/52 (!) 102/50  Pulse: 66 (!) 58  Resp: 14 16  Temp: 36.5 C   SpO2: 99% 100%    Last Pain:  Vitals:   11/23/21 1303  TempSrc:   PainSc: 0-No pain                 Marthenia Rolling

## 2021-11-23 NOTE — Interval H&P Note (Signed)
History and Physical Interval Note:  11/23/2021 12:21 PM  Jillian Andrade  has presented today for surgery, with the diagnosis of loose stools/ rectal bleeding.  The various methods of treatment have been discussed with the patient and family. After consideration of risks, benefits and other options for treatment, the patient has consented to  Procedure(s): COLONOSCOPY WITH PROPOFOL (N/A) as a surgical intervention.  The patient's history has been reviewed, patient examined, no change in status, stable for surgery.  I have reviewed the patient's chart and labs.  Questions were answered to the patient's satisfaction.     [name]

## 2021-11-23 NOTE — Op Note (Signed)
[facility] Patient Name: Jillian Andrade Procedure Date: 11/23/2021 MRN: [id number] Attending MD: [name] , MD Date of Birth: 07-Oct-1961 CSN: [id number] Age: 61 Admit Type: Outpatient Procedure:                Colonoscopy Indications:              Last colonoscopy: June 2015, Diarrhea, Rectal                            bleeding Providers:                [name], MD, Dulcy Fanny, Frazier Richards, Technician Referring MD:             Biagio Borg, MD Medicines:                Propofol per Anesthesia, Monitored Anesthesia Care Complications:            No immediate complications. Estimated Blood Loss:     Estimated blood loss was minimal. Procedure:                Pre-Anesthesia Assessment:                           - Prior to the procedure, a History and Physical                            was performed, and patient medications and                            allergies were reviewed. The patient's tolerance of                            previous anesthesia was also reviewed. The risks                            and benefits of the procedure and the sedation                            options and risks were discussed with the patient.                            All questions were answered, and informed consent                            was obtained. Prior Anticoagulants: The patient has                            taken no previous anticoagulant or antiplatelet                            agents. ASA Grade Assessment: II - A patient with  mild systemic disease. After reviewing the risks                            and benefits, the patient was deemed in                            satisfactory condition to undergo the procedure.                           After obtaining informed consent, the colonoscope                            was passed under direct vision. Throughout the                             procedure, the patient's blood pressure, pulse, and                            oxygen saturations were monitored continuously. The                            PCF-H190TL ([id number] Olympus slim colonoscope was                            introduced through the anus and advanced to the the                            cecum, identified by appendiceal orifice and                            ileocecal valve. The colonoscopy was somewhat                            difficult due to a tortuous colon. Successful                            completion of the procedure was aided by                            straightening and shortening the scope to obtain                            bowel loop reduction. The patient tolerated the                            procedure well. The quality of the bowel                            preparation was adequate and good. The terminal                            ileum, ileocecal valve, appendiceal orifice, and  rectum were photographed. Scope In: 12:29:57 PM Scope Out: 12:45:00 PM Scope Withdrawal Time: 0 hours 8 minutes 56 seconds  Total Procedure Duration: 0 hours 15 minutes 3 seconds  Findings:      The perianal and digital rectal examinations were normal.      A diffuse area of mildly congested mucosa was found in the entire colon.       Biopsies were taken with a cold forceps for histology. Estimated blood       loss was minimal.      Internal hemorrhoids were found during retroflexion. The hemorrhoids       were small and Grade I (internal hemorrhoids that do not prolapse).      The terminal ileum appeared normal. Impression:               - Congested mucosa in the entire examined colon.                            Biopsied.                           - Internal hemorrhoids.                           - The examined portion of the ileum was normal. Moderate Sedation:      N/A - MAC procedure Recommendation:           - Patient has a  contact number available for                            emergencies. The signs and symptoms of potential                            delayed complications were discussed with the                            patient. Return to normal activities tomorrow.                            Written discharge instructions were provided to the                            patient.                           - Await pathology results.                           - Repeat colonoscopy for surveillance based on                            pathology results. Procedure Code(s):        --- Professional ---                           [id number], Colonoscopy, flexible; with biopsy, single  or multiple Diagnosis Code(s):        --- Professional ---                           K62.5, Hemorrhage of anus and rectum                           R19.7, Diarrhea, unspecified                           K64.0, First degree hemorrhoids                           K63.89, Other specified diseases of intestine CPT copyright 2019 [employer]. All rights reserved. The codes documented in this report are preliminary and upon coder review may  be revised to meet current compliance requirements. [name], MD 11/23/2021 12:55:06 PM This report has been signed electronically. Number of Addenda: 0

## 2021-11-23 NOTE — Discharge Instructions (Addendum)

## 2021-11-23 NOTE — Transfer of Care (Signed)
Immediate Anesthesia Transfer of Care Note  Patient: Jillian Andrade  Procedure(s) Performed: COLONOSCOPY WITH PROPOFOL BIOPSY  Patient Location: PACU and Endoscopy Unit  Anesthesia Type:MAC  Level of Consciousness: awake, alert  and patient cooperative  Airway & Oxygen Therapy: Patient Spontanous Breathing and Patient connected to face mask oxygen  Post-op Assessment: Report given to RN and Post -op Vital signs reviewed and stable  Post vital signs: Reviewed and stable  Last Vitals:  Vitals Value Taken Time  BP 101/52 11/23/21 1253  Temp 36.5 C 11/23/21 1253  Pulse 59 11/23/21 1254  Resp 17 11/23/21 1254  SpO2 98 % 11/23/21 1254  Vitals shown include unvalidated device data.  Last Pain:  Vitals:   11/23/21 1253  TempSrc: Tympanic  PainSc: Asleep         Complications: No notable events documented.

## 2021-11-24 ENCOUNTER — Encounter (HOSPITAL_COMMUNITY): Payer: Self-pay | Admitting: Gastroenterology

## 2021-11-24 LAB — SURGICAL PATHOLOGY

## 2021-11-27 ENCOUNTER — Other Ambulatory Visit (HOSPITAL_COMMUNITY): Payer: Self-pay

## 2021-11-28 ENCOUNTER — Other Ambulatory Visit: Payer: Self-pay

## 2021-11-28 ENCOUNTER — Other Ambulatory Visit (HOSPITAL_COMMUNITY): Payer: Self-pay

## 2021-11-30 ENCOUNTER — Encounter: Payer: Self-pay | Admitting: Internal Medicine

## 2021-11-30 ENCOUNTER — Other Ambulatory Visit: Payer: Self-pay

## 2021-11-30 ENCOUNTER — Other Ambulatory Visit (HOSPITAL_COMMUNITY): Payer: Self-pay

## 2021-11-30 MED ORDER — PANTOPRAZOLE SODIUM 40 MG PO TBEC
40.0000 mg | DELAYED_RELEASE_TABLET | Freq: Every day | ORAL | 0 refills | Status: DC
  Filled 2021-11-30: qty 90, 90d supply, fill #0

## 2021-12-07 ENCOUNTER — Other Ambulatory Visit (HOSPITAL_COMMUNITY): Payer: Self-pay

## 2021-12-08 ENCOUNTER — Other Ambulatory Visit: Payer: Self-pay

## 2021-12-08 ENCOUNTER — Ambulatory Visit: Payer: 59 | Admitting: Internal Medicine

## 2021-12-08 ENCOUNTER — Other Ambulatory Visit (HOSPITAL_COMMUNITY): Payer: Self-pay

## 2021-12-08 ENCOUNTER — Encounter: Payer: Self-pay | Admitting: Internal Medicine

## 2021-12-08 DIAGNOSIS — M546 Pain in thoracic spine: Secondary | ICD-10-CM | POA: Diagnosis not present

## 2021-12-08 DIAGNOSIS — D696 Thrombocytopenia, unspecified: Secondary | ICD-10-CM

## 2021-12-08 DIAGNOSIS — E559 Vitamin D deficiency, unspecified: Secondary | ICD-10-CM

## 2021-12-08 DIAGNOSIS — R739 Hyperglycemia, unspecified: Secondary | ICD-10-CM | POA: Diagnosis not present

## 2021-12-08 NOTE — Assessment & Plan Note (Signed)
Lab Results  Component Value Date   HGBA1C 6.1 09/22/2021   Stable, pt to continue current medical treatment  - diet, and consider wegovy for obesity

## 2021-12-08 NOTE — Progress Notes (Signed)
Patient ID: [name], female   DOB: 03-21-1961, 61 y.o.   MRN: [id number]        Chief Complaint: follow up t spine pain with radicular symptoms, low platelets, low Vit D, and obesity       HPI:  Jillian Andrade is a 61 y.o. female here with c/o persistent now worsening 1 wk t spine pain about t5 it seems with radicular pain and numbness to the left back/side and front.   Recent colonoscopy showed no diagnosis related, so pt now willing to consider further spine evaluation.  Is sp c5-6 c spine fusion per Dr Ellene Route  Has no overt bleeding or bruising in relation to recent ongoing mild low platelets. Has been taking less Vit D since last visit since Vit D level was increased.  Pt continues obesity, working on lower calorie diet and exercise, and might consider wegovy after other events above        Wt Readings from Last 3 Encounters:  12/08/21 195 lb (88.5 kg)  11/23/21 185 lb (83.9 kg)  09/25/21 197 lb (89.4 kg)   BP Readings from Last 3 Encounters:  12/08/21 116/62  11/23/21 (!) 116/56  09/25/21 104/60         Past Medical History:  Diagnosis Date   Allergic rhinitis 0/12/7046   Complication of anesthesia    n/v   Fibroids in ovaries    pain in left side   GERD (gastroesophageal reflux disease)    no medications at this time   Hand osteomyelitis ([place]) 03/06/2019   Heart murmur    back in the 90s with the mitral valve prolapse   Insomnia 05/01/2016   Lumbar disc disease 05/01/2016   MVP (mitral valve prolapse) mild, no cardiologist   No pertinent past medical history    PONV (postoperative nausea and vomiting)    Past Surgical History:  Procedure Laterality Date   ABDOMINAL HYSTERECTOMY     ANKLE ARTHROSCOPY  10/31/2011   Procedure: ANKLE ARTHROSCOPY;  Surgeon: Colin Rhein, MD;  Location: [place];  Service: Orthopedics;  Laterality: Right;  RIGHT ANKLE ARTHROSCOPY WITH EXTENSIVE DEBRIDEMENT   ANTERIOR CERVICAL DECOMP/DISCECTOMY FUSION N/A 10/04/2016    Procedure: CERVICAL FIVE-CERVICAL SIX  ANTERIOR CERVICAL DECOMPRESSION/DISCECTOMY/FUSION;  Surgeon: Erline Levine, MD;  Location: [place];  Service: Neurosurgery;  Laterality: N/A;  C5-6 ANTERIOR CERVICAL DECOMPRESSION/DISCECTOMY/FUSION   BIOPSY  11/23/2021   Procedure: BIOPSY;  Surgeon: Wilford Corner, MD;  Location: [facility];  Service: Endoscopy;;   COLONOSCOPY     COLONOSCOPY N/A 03/02/2014   Procedure: COLONOSCOPY;  Surgeon: Cleotis Nipper, MD;  Location: [facility];  Service: Endoscopy;  Laterality: N/A;  ultra slim scope   COLONOSCOPY WITH PROPOFOL N/A 11/23/2021   Procedure: COLONOSCOPY WITH PROPOFOL;  Surgeon: Wilford Corner, MD;  Location: [facility];  Service: Endoscopy;  Laterality: N/A;   DILATION AND CURETTAGE OF UTERUS     HEMORRHOID SURGERY      reports that she has never smoked. She has been exposed to tobacco smoke. She has never used smokeless tobacco. She reports that she does not drink alcohol and does not use drugs. family history includes Glaucoma in her mother; Hypertension in her father; Stroke in her father. Allergies  Allergen Reactions   Bee Venom Anaphylaxis   Propoxyphene Nausea And Vomiting    Allergy to Wygesic   Orange Juice [Orange Oil] Hives and Nausea And Vomiting   Oxycodone Hives   Current Outpatient Medications on File Prior  to Visit  Medication Sig Dispense Refill   amoxicillin (AMOXIL) 500 MG capsule TAKE 4 CAPSULES BY MOUTH 1 HOURS PRIOR TO APPOINTMENT 12 capsule 2   Ascorbic Acid (VITAMIN C) 1000 MG tablet Take 1,000 mg by mouth daily.     cholecalciferol (VITAMIN D3) 25 MCG (1000 UNIT) tablet Take 1,000 Units by mouth daily.     cyclobenzaprine (FLEXERIL) 5 MG tablet Take 1 tablet (5 mg total) by mouth 3 (three) times daily as needed for muscle spasms. 40 tablet 2   cycloSPORINE (RESTASIS) 0.05 % ophthalmic emulsion INSTILL 1 DROP IN BOTH EYES TWO TIMES DAILY 180 each 4   diclofenac sodium (VOLTAREN) 1 % GEL Apply 2 g topically 4 (four)  times daily as needed. 200 g 11   dicyclomine (BENTYL) 20 MG tablet Take 1 tablet (20 mg total) by mouth 3 (three) times daily. 90 tablet 3   dicyclomine (BENTYL) 20 MG tablet 1 tablet     estradiol (ESTRACE) 1 MG tablet Take 1 tablet (1 mg total) by mouth daily. 90 tablet 3   hyoscyamine (ANASPAZ) 0.125 MG TBDP disintergrating tablet Place 1 tablet on the tongue and allow to dissolve every 4 to 6 hours as needed 120 tablet 1   ketorolac (TORADOL) 10 MG tablet Take 1 tablet (10 mg total) by mouth every 8 (eight) hours as needed. 10 tablet 0   Multiple Vitamins-Minerals (ALIVE WOMENS ENERGY) TABS Take 1 tablet by mouth daily.     omeprazole (PRILOSEC) 40 MG capsule Take 1 capsule (40 mg total) by mouth daily. 30 capsule 0   pantoprazole (PROTONIX) 20 MG tablet Take 20 mg by mouth every morning.     pantoprazole (PROTONIX) 40 MG tablet Take 1 tablet (40 mg total) by mouth daily about 30 minutes before breakfast 90 tablet 0   polyethylene glycol-electrolytes (NULYTELY) 420 g solution Use as directed 4000 mL 0   rosuvastatin (CRESTOR) 20 MG tablet Take 1 tablet (20 mg total) by mouth daily. 90 tablet 3   clobetasol ointment (TEMOVATE) 1.61 % APPLY 1 APPLICATION TOPICALLY 2 TIMES DAILY (Patient not taking: Reported on 11/20/2021) 60 g 0   Zoster Vaccine Adjuvanted [facility]) injection Inject into the muscle. 0.5 mL 1   No current facility-administered medications on file prior to visit.        ROS:  All others reviewed and negative.  Objective        PE:  BP 116/62 (BP Location: Right Arm, Patient Position: Sitting, Cuff Size: Large)    Pulse 70    Temp 99.1 F (37.3 C) (Oral)    Ht 5\' 4"  (1.626 m)    Wt 195 lb (88.5 kg)    SpO2 98%    BMI 33.47 kg/m                 Constitutional: Pt appears in NAD               HENT: Head: NCAT.                Right Ear: External ear normal.                 Left Ear: External ear normal.                Eyes: . Pupils are equal, round, and reactive to light.  Conjunctivae and EOM are normal               Nose: without d/c or deformity  Neck: Neck supple. Gross normal ROM               Cardiovascular: Normal rate and regular rhythm.                 Pulmonary/Chest: Effort normal and breath sounds without rales or wheezing.                Abd:  Soft, NT, ND, + BS, no organomegaly               Neurological: Pt is alert. At baseline orientation, motor grossly intact; tspine nontender in the midline               Skin: Skin is warm. No rashes, no other new lesions, LE edema - none               Psychiatric: Pt behavior is normal without agitation   Micro: none  Cardiac tracings I have personally interpreted today:  none  Pertinent Radiological findings (summarize): none   Lab Results  Component Value Date   WBC 5.5 09/22/2021   HGB 13.6 09/22/2021   HCT 40.2 09/22/2021   PLT 119.0 Repeated and verified X2. (L) 09/22/2021   GLUCOSE 83 09/22/2021   CHOL 134 09/22/2021   TRIG 76.0 09/22/2021   HDL 58.70 09/22/2021   LDLCALC 60 09/22/2021   ALT 25 09/22/2021   AST 28 09/22/2021   NA 140 09/22/2021   K 4.1 09/22/2021   CL 105 09/22/2021   CREATININE 0.94 09/22/2021   BUN 12 09/22/2021   CO2 27 09/22/2021   TSH 0.78 09/22/2021   HGBA1C 6.1 09/22/2021   Assessment/Plan:  CARSON MECHE is a 61 y.o. Black or African American [2] female with  has a past medical history of Allergic rhinitis (06/15/5175), Complication of anesthesia, Fibroids (in ovaries ), GERD (gastroesophageal reflux disease), Hand osteomyelitis ([place]) (03/06/2019), Heart murmur, Insomnia (05/01/2016), Lumbar disc disease (05/01/2016), MVP (mitral valve prolapse) (mild, no cardiologist), No pertinent past medical history, and PONV (postoperative nausea and vomiting).  Thoracic spine pain With left sided radicular symptoms, declines gabapentin for now, but for MRI t spine, and consdier /fu with Dr Elsner/NS  Thrombocytopenia [facility]) Lab Results  Component Value  Date   WBC 5.5 09/22/2021   HGB 13.6 09/22/2021   HCT 40.2 09/22/2021   MCV 88.5 09/22/2021   PLT 119.0 Repeated and verified X2. (L) 09/22/2021  mild chronic persistent, asympt, cont to follow and monitor  Vitamin D deficiency Last vitamin D Lab Results  Component Value Date   VD25OH 110.26 ([place]) 09/22/2021   overcontrolled, cont oral replacement at lower dose   Hyperglycemia Lab Results  Component Value Date   HGBA1C 6.1 09/22/2021   Stable, pt to continue current medical treatment  - diet, and consider wegovy for obesity  Followup: Return if symptoms worsen or fail to improve.  Cathlean Cower, MD 12/08/2021 9:53 AM [facility]

## 2021-12-08 NOTE — Assessment & Plan Note (Signed)
Lab Results  Component Value Date   WBC 5.5 09/22/2021   HGB 13.6 09/22/2021   HCT 40.2 09/22/2021   MCV 88.5 09/22/2021   PLT 119.0 Repeated and verified X2. (L) 09/22/2021  mild chronic persistent, asympt, cont to follow and monitor

## 2021-12-08 NOTE — Assessment & Plan Note (Signed)
With left sided radicular symptoms, declines gabapentin for now, but for MRI t spine, and consdier /fu with Dr Elsner/NS

## 2021-12-08 NOTE — Patient Instructions (Signed)
You will be contacted regarding the referral for: MRI for the thoracic spine  Please call if you change your mind about gabapentin for nerve pain, or wegovy for wt loss  Please continue the reduced Vit D to 1 pill per day  Please continue all other medications as before, and refills have been done if requested.  Please have the pharmacy call with any other refills you may need.  Please continue your efforts at being more active, low cholesterol diet, and weight control..  Please keep your appointments with your specialists as you may have planned

## 2021-12-08 NOTE — Assessment & Plan Note (Signed)
Last vitamin D Lab Results  Component Value Date   VD25OH 110.26 ([place]) 09/22/2021   overcontrolled, cont oral replacement at lower dose

## 2021-12-14 DIAGNOSIS — Z0289 Encounter for other administrative examinations: Secondary | ICD-10-CM

## 2021-12-22 ENCOUNTER — Encounter (INDEPENDENT_AMBULATORY_CARE_PROVIDER_SITE_OTHER): Payer: Self-pay | Admitting: Family Medicine

## 2021-12-22 ENCOUNTER — Ambulatory Visit
Admission: RE | Admit: 2021-12-22 | Discharge: 2021-12-22 | Disposition: A | Discharge status: Home / Self Care | Payer: 59 | Source: Ambulatory Visit | Attending: Internal Medicine | Admitting: Internal Medicine

## 2021-12-22 ENCOUNTER — Ambulatory Visit (INDEPENDENT_AMBULATORY_CARE_PROVIDER_SITE_OTHER): Payer: 59 | Admitting: Family Medicine

## 2021-12-22 ENCOUNTER — Other Ambulatory Visit: Payer: Self-pay

## 2021-12-22 VITALS — BP 129/62 | HR 60 | Temp 98.0°F | Ht 64.0 in | Wt 192.0 lb

## 2021-12-22 DIAGNOSIS — M546 Pain in thoracic spine: Secondary | ICD-10-CM

## 2021-12-22 DIAGNOSIS — D500 Iron deficiency anemia secondary to blood loss (chronic): Secondary | ICD-10-CM | POA: Diagnosis not present

## 2021-12-22 DIAGNOSIS — R7303 Prediabetes: Secondary | ICD-10-CM

## 2021-12-22 DIAGNOSIS — Z6834 Body mass index (BMI) 34.0-34.9, adult: Secondary | ICD-10-CM

## 2021-12-22 DIAGNOSIS — K219 Gastro-esophageal reflux disease without esophagitis: Secondary | ICD-10-CM

## 2021-12-22 DIAGNOSIS — Z1331 Encounter for screening for depression: Secondary | ICD-10-CM

## 2021-12-22 DIAGNOSIS — E7849 Other hyperlipidemia: Secondary | ICD-10-CM | POA: Diagnosis not present

## 2021-12-22 DIAGNOSIS — E669 Obesity, unspecified: Secondary | ICD-10-CM

## 2021-12-22 DIAGNOSIS — Z9189 Other specified personal risk factors, not elsewhere classified: Secondary | ICD-10-CM

## 2021-12-22 DIAGNOSIS — R5383 Other fatigue: Secondary | ICD-10-CM

## 2021-12-22 DIAGNOSIS — E559 Vitamin D deficiency, unspecified: Secondary | ICD-10-CM

## 2021-12-22 DIAGNOSIS — E7800 Pure hypercholesterolemia, unspecified: Secondary | ICD-10-CM | POA: Diagnosis not present

## 2021-12-22 DIAGNOSIS — M47814 Spondylosis without myelopathy or radiculopathy, thoracic region: Secondary | ICD-10-CM | POA: Diagnosis not present

## 2021-12-25 ENCOUNTER — Other Ambulatory Visit (HOSPITAL_COMMUNITY): Payer: Self-pay

## 2021-12-25 NOTE — Progress Notes (Unsigned)
Chief Complaint:   OBESITY Jillian Andrade (MR# [id number]) is a 61 y.o. female who presents for evaluation and treatment of obesity and related comorbidities. Current BMI is Body mass index is 32.96 kg/m. Jillian Andrade has been struggling with her weight for many years and has been unsuccessful in either losing weight, maintaining weight loss, or reaching her healthy weight goal.  Jillian Andrade's co-workers come here. She is lactose sensitive. 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 Skipping dinner nightly. Breakfast -yogurt with nuts or granola, or may have 1 boiled egg or cheese and crackers (satisfied). Lunch-between 2-3 pm, salmon, broccoli, rice 1/2 cup, or tuna and crackers, 2 wings, or 2 legs (feels full), with or without 100 calorie cookie pack.  Jillian Andrade is currently in the action stage of change and ready to dedicate time achieving and maintaining a healthier weight. Jillian Andrade is interested in becoming our patient and working on intensive lifestyle modifications including (but not limited to) diet and exercise for weight loss.  Jillian Andrade's habits were reviewed today and are as follows: her desired weight loss is 42-47 lbs, she started gaining weight in her 40's, her heaviest weight ever was 193 pounds, she has significant food cravings issues, she skips meals frequently, and she is frequently drinking liquids with calories.  Depression Screen Jillian Andrade's Food and Mood (modified PHQ-9) Andrade was 2.  Depression screen [facility] 2/9 12/22/2021  Decreased Interest 0  Down, Depressed, Hopeless 0  PHQ - 2 Andrade 0  Altered sleeping 0  Tired, decreased energy 1  Change in appetite 0  Feeling bad or failure about yourself  0  Trouble concentrating 1  Moving slowly or fidgety/restless 0  Suicidal thoughts 0  PHQ-9 Andrade 2  Difficult doing work/chores Not difficult at all  Some encounter information is confidential and restricted. Go to Review Flowsheets activity to see all data.    Subjective:   1. Other fatigue Jillian Andrade admits to daytime somnolence and admits to waking up still tired. Patient has a history of symptoms of daytime fatigue and morning fatigue. Jillian Andrade generally gets 4 or 5 hours of sleep per night, and states that she has difficulty falling asleep. Jillian Andrade is present. Apneic episodes are present. Jillian Andrade is 3. EKG was done on 08/26/2021, normal sinus rhythm.  2. Vitamin D deficiency Jillian Andrade was previously diagnosed, and she is on OTC Vit D 2,000 IU daily. Last Vit D level was 110.  3. Other hyperlipidemia Jillian Andrade is on rosuvastatin 20 mg daily. She denies myalgias or transaminate. She was diagnosed 3 years ago.  4. Gastroesophageal reflux disease, unspecified whether esophagitis present Jillian Andrade is on Protonix, and her symptoms are well managed.  5. Pre-diabetes Jillian Andrade's last A1c was 6.1 on 09/22/2021. She is not on medications.  6. Blood loss anemia Jillian Andrade is not sure what type of anemia she has. She was diagnosed prior to her hysterectomy.   7. At risk for deficient intake of food Jillian Andrade is at a higher than average risk of deficient intake of food due to skipping meals.  Assessment/Plan:   1. Other fatigue Jillian Andrade does feel that her weight is causing her energy to be lower than it should be. Fatigue may be related to obesity, depression or many other causes. Labs will be ordered, and in the meanwhile, Jillian Andrade will focus on self care including making healthy food choices, increasing physical activity and focusing on stress reduction.  - Thyroid Panel With TSH  2. Vitamin  D deficiency We will check labs today, and we will follow up at Jillian Andrade's next appointment.  - VITAMIN D 25 Hydroxy (Vit-D Deficiency, Fractures)  3. Other hyperlipidemia We will check labs today, and we will follow up at Jillian Andrade's next appointment.  - Lipid Panel With LDL/HDL Ratio  4. Gastroesophageal reflux disease, unspecified whether esophagitis present We  will follow up on [place]'s symptoms control at her next appointment.  5. Pre-diabetes We will check labs today, and we will follow up at [name]'s next appointment.  - Comprehensive metabolic panel - Hemoglobin A1c - Insulin, random  6. Blood loss anemia We will check labs today, and we will follow up at Jillian Andrade's next appointment.  - Vitamin B12 - Folate - CBC w/Diff/Platelet  7. Depression screening [name] had a negative depression screening. Depression is commonly associated with obesity and often results in emotional eating behaviors. We will monitor this closely and work on CBT to help improve the non-hunger eating patterns. Referral to Psychology may be required if no improvement is seen as she continues in our clinic.  8. At risk for deficient intake of food Jillian Andrade was given approximately 15 minutes of deficient intake of food prevention counseling today. Jillian Andrade is at risk for eating too few calories based on current food recall. She was encouraged to focus on meeting caloric and protein goals according to her recommended meal plan.  9. Obesity with current BMI of 34.1 Jillian Andrade is currently in the action stage of change and her goal is to continue with weight loss efforts. I recommend [name] begin the structured treatment plan as follows:  She has agreed to the Category 2 Plan.  Exercise goals: No exercise has been prescribed at this time.   Behavioral modification strategies: increasing lean protein intake, no skipping meals, and meal planning and cooking strategies.  She was informed of the importance of frequent follow-up visits to maximize her success with intensive lifestyle modifications for her multiple health conditions. She was informed we would discuss her lab results at her next visit unless there is a critical issue that needs to be addressed sooner. Jillian Andrade agreed to keep her next visit at the agreed upon time to discuss these results.  Objective:   Blood pressure  129/62, pulse 60, temperature 98 F (36.7 C), height '5\' 4"'$  (1.626 m), weight 192 lb (87.1 kg), SpO2 98 %. Body mass index is 32.96 kg/m.  EKG: Normal sinus rhythm, rate (unable to obtain).  Indirect Calorimeter completed today shows a VO2 of 205 and a REE of 1411.  Her calculated basal metabolic rate is 3532 thus her basal metabolic rate is worse than expected.  General: Cooperative, alert, well developed, in no acute distress. HEENT: Conjunctivae and lids unremarkable. Cardiovascular: Regular rhythm.  Lungs: Normal work of breathing. Neurologic: No focal deficits.   Lab Results  Component Value Date   CREATININE 0.94 09/22/2021   BUN 12 09/22/2021   NA 140 09/22/2021   K 4.1 09/22/2021   CL 105 09/22/2021   CO2 27 09/22/2021   Lab Results  Component Value Date   ALT 25 09/22/2021   AST 28 09/22/2021   ALKPHOS 52 09/22/2021   BILITOT 0.7 09/22/2021   Lab Results  Component Value Date   HGBA1C 6.1 09/22/2021   HGBA1C 6.0 09/12/2020   HGBA1C 6.0 08/31/2019   HGBA1C 6.1 03/13/2019   HGBA1C 6.1 09/02/2018   No results found for: INSULIN Lab Results  Component Value Date   TSH 0.78 09/22/2021  Lab Results  Component Value Date   CHOL 134 09/22/2021   HDL 58.70 09/22/2021   LDLCALC 60 09/22/2021   TRIG 76.0 09/22/2021   CHOLHDL 2 09/22/2021   Lab Results  Component Value Date   WBC 5.5 09/22/2021   HGB 13.6 09/22/2021   HCT 40.2 09/22/2021   MCV 88.5 09/22/2021   PLT 119.0 Repeated and verified X2. (L) 09/22/2021   Lab Results  Component Value Date   IRON 56 03/13/2019   Attestation Statements:   Reviewed by clinician on day of visit: allergies, medications, problem list, medical history, surgical history, family history, social history, and previous encounter notes.   I, Trixie Dredge, am acting as transcriptionist for Coralie Common, MD.  I have reviewed the above documentation for accuracy and completeness, and I agree with the above. - ***

## 2021-12-26 LAB — CBC WITH DIFFERENTIAL/PLATELET
Basophils Absolute: 0 10*3/uL (ref 0.0–0.2)
Basos: 1 %
EOS (ABSOLUTE): 0.2 10*3/uL (ref 0.0–0.4)
Eos: 3 %
Hematocrit: 40.9 % (ref 34.0–46.6)
Hemoglobin: 13.4 g/dL (ref 11.1–15.9)
Immature Grans (Abs): 0 10*3/uL (ref 0.0–0.1)
Immature Granulocytes: 0 %
Lymphocytes Absolute: 1.6 10*3/uL (ref 0.7–3.1)
Lymphs: 26 %
MCH: 29.4 pg (ref 26.6–33.0)
MCHC: 32.8 g/dL (ref 31.5–35.7)
MCV: 90 fL (ref 79–97)
Monocytes Absolute: 0.5 10*3/uL (ref 0.1–0.9)
Monocytes: 8 %
Neutrophils Absolute: 4 10*3/uL (ref 1.4–7.0)
Neutrophils: 62 %
Platelets: 142 10*3/uL — ABNORMAL LOW (ref 150–450)
RBC: 4.56 x10E6/uL (ref 3.77–5.28)
RDW: 12.5 % (ref 11.7–15.4)
WBC: 6.3 10*3/uL (ref 3.4–10.8)

## 2021-12-26 LAB — THYROID PANEL WITH TSH
Free Thyroxine Index: 2.1 (ref 1.2–4.9)
T3 Uptake Ratio: 24 % (ref 24–39)
T4, Total: 8.8 ug/dL (ref 4.5–12.0)
TSH: 0.662 u[IU]/mL (ref 0.450–4.500)

## 2021-12-26 LAB — COMPREHENSIVE METABOLIC PANEL
ALT: 23 IU/L (ref 0–32)
AST: 33 IU/L (ref 0–40)
Albumin/Globulin Ratio: 2 (ref 1.2–2.2)
Albumin: 4.9 g/dL (ref 3.8–4.9)
Alkaline Phosphatase: 68 IU/L (ref 44–121)
BUN/Creatinine Ratio: 12 (ref 9–23)
BUN: 11 mg/dL (ref 6–24)
Bilirubin Total: 0.5 mg/dL (ref 0.0–1.2)
CO2: 21 mmol/L (ref 20–29)
Calcium: 9.6 mg/dL (ref 8.7–10.2)
Chloride: 103 mmol/L (ref 96–106)
Creatinine, Ser: 0.9 mg/dL (ref 0.57–1.00)
Globulin, Total: 2.5 g/dL (ref 1.5–4.5)
Glucose: 81 mg/dL (ref 70–99)
Potassium: 4.1 mmol/L (ref 3.5–5.2)
Sodium: 140 mmol/L (ref 134–144)
Total Protein: 7.4 g/dL (ref 6.0–8.5)
eGFR: 74 mL/min/{1.73_m2} (ref >59)

## 2021-12-26 LAB — LIPID PANEL WITH LDL/HDL RATIO
Cholesterol, Total: 144 mg/dL (ref 100–199)
HDL: 64 mg/dL (ref >39)
LDL Chol Calc (NIH): 68 mg/dL (ref 0–99)
LDL/HDL Ratio: 1.1 ratio (ref 0.0–3.2)
Triglycerides: 54 mg/dL (ref 0–149)
VLDL Cholesterol Cal: 12 mg/dL (ref 5–40)

## 2021-12-26 LAB — VITAMIN B12: Vitamin B-12: 1696 pg/mL — ABNORMAL HIGH (ref 232–1245)

## 2021-12-26 LAB — FOLATE: Folate: >20 ng/mL (ref >3.0)

## 2021-12-26 LAB — HEMOGLOBIN A1C
Est. average glucose Bld gHb Est-mCnc: 123 mg/dL
Hgb A1c MFr Bld: 5.9 % — ABNORMAL HIGH (ref 4.8–5.6)

## 2021-12-26 LAB — VITAMIN D 25 HYDROXY (VIT D DEFICIENCY, FRACTURES): Vit D, 25-Hydroxy: 90 ng/mL (ref 30.0–100.0)

## 2021-12-26 LAB — INSULIN, RANDOM: INSULIN: 6.9 u[IU]/mL (ref 2.6–24.9)

## 2021-12-28 ENCOUNTER — Other Ambulatory Visit: Payer: Self-pay | Admitting: Neurosurgery

## 2021-12-28 ENCOUNTER — Other Ambulatory Visit (HOSPITAL_COMMUNITY): Payer: Self-pay | Admitting: Neurosurgery

## 2021-12-29 ENCOUNTER — Other Ambulatory Visit (HOSPITAL_COMMUNITY): Payer: Self-pay

## 2022-01-01 DIAGNOSIS — H43813 Vitreous degeneration, bilateral: Secondary | ICD-10-CM | POA: Diagnosis not present

## 2022-01-01 DIAGNOSIS — Z961 Presence of intraocular lens: Secondary | ICD-10-CM | POA: Diagnosis not present

## 2022-01-01 DIAGNOSIS — H04123 Dry eye syndrome of bilateral lacrimal glands: Secondary | ICD-10-CM | POA: Diagnosis not present

## 2022-01-05 ENCOUNTER — Ambulatory Visit (HOSPITAL_COMMUNITY)
Admission: RE | Admit: 2022-01-05 | Discharge: 2022-01-05 | Disposition: A | Discharge status: Home / Self Care | Payer: 59 | Source: Ambulatory Visit | Attending: Neurosurgery | Admitting: Neurosurgery

## 2022-01-05 ENCOUNTER — Other Ambulatory Visit: Payer: Self-pay

## 2022-01-05 DIAGNOSIS — N281 Cyst of kidney, acquired: Secondary | ICD-10-CM | POA: Diagnosis not present

## 2022-01-08 ENCOUNTER — Ambulatory Visit (INDEPENDENT_AMBULATORY_CARE_PROVIDER_SITE_OTHER): Payer: 59 | Admitting: Family Medicine

## 2022-01-08 ENCOUNTER — Other Ambulatory Visit: Payer: Self-pay

## 2022-01-08 ENCOUNTER — Encounter (INDEPENDENT_AMBULATORY_CARE_PROVIDER_SITE_OTHER): Payer: Self-pay | Admitting: Family Medicine

## 2022-01-08 VITALS — BP 146/77 | HR 59 | Temp 99.0°F | Ht 64.0 in | Wt 187.0 lb

## 2022-01-08 DIAGNOSIS — R7303 Prediabetes: Secondary | ICD-10-CM

## 2022-01-08 DIAGNOSIS — Z6832 Body mass index (BMI) 32.0-32.9, adult: Secondary | ICD-10-CM

## 2022-01-08 DIAGNOSIS — E669 Obesity, unspecified: Secondary | ICD-10-CM

## 2022-01-08 DIAGNOSIS — E7849 Other hyperlipidemia: Secondary | ICD-10-CM

## 2022-01-08 DIAGNOSIS — E559 Vitamin D deficiency, unspecified: Secondary | ICD-10-CM

## 2022-01-09 NOTE — Progress Notes (Signed)
? ? ? ?Chief Complaint:  ? ?OBESITY ?Jillian Andrade is here to discuss her progress with her obesity treatment plan along with follow-up of her obesity related diagnoses. [name] is on the Category 2 Plan and states she is following her eating plan approximately 95% of the time. [name] states she is walking 10,000 steps per day. ? ?Today's visit was #: 2 ?Starting weight: 192 lbs ?Starting date: 12/22/2021 ?Today's weight: 187 lbs ?Today's date: 01/08/2022 ?Total lbs lost to date: 5 ?Total lbs lost since last in-office visit: 5 ? ?Interim History: [name] reports that she is getting used to the food quantity. Her biggest surprise is getting food in. Her snack calories were beef jerky, skinny popcorn, and Halo top fruit bars. Cerria is wondering about other options for breakfast. She is also interested in protein shakes and bars as adjunct to the plan. Fit Crunch bars are 380 calories and 30 grams of protein.  ? ?Subjective:  ? ?1. Prediabetes ?Jillian Andrade has a diagnosis of prediabetes based on her elevated HgA1c of 5.9 and Insulin was 6.9. Her carb cravings are relatively controlled. She was informed this puts her at greater risk of developing diabetes. She continues to work on diet and exercise to decrease her risk of diabetes. She denies nausea or hypoglycemia. ? ?Lab Results  ?Component Value Date  ? HGBA1C 5.9 (H) 12/22/2021  ? ?Lab Results  ?Component Value Date  ? INSULIN 6.9 12/22/2021  ? ?2. Vitamin D deficiency ?She is currently taking OTC vitamin D 2,000 each day. Her vitamin D level was 90. She notes fatigue and denies nausea, vomiting, or muscle weakness. ? ?Lab Results  ?Component Value Date  ? VD25OH 90.0 12/22/2021  ? VD25OH 110.26 (HH) 09/22/2021  ? VD25OH 99.60 09/12/2020  ? ?3. Other hyperlipidemia ?Jillian Andrade has hyperlipidemia is on Crestor. Her LDL was 68, HDL was 64, and triglycerides were 54. She has been trying to improve her cholesterol levels with intensive lifestyle modifications including a low saturated  fat diet, exercise, and weight loss. She denies any myalgias or transaminitis. ? ?Lab Results  ?Component Value Date  ? ALT 23 12/22/2021  ? AST 33 12/22/2021  ? ALKPHOS 68 12/22/2021  ? BILITOT 0.5 12/22/2021  ? ?Lab Results  ?Component Value Date  ? CHOL 144 12/22/2021  ? HDL 64 12/22/2021  ? [place] 68 12/22/2021  ? TRIG 54 12/22/2021  ? CHOLHDL 2 09/22/2021  ?  ? ?Assessment/Plan:  ? ?1. Prediabetes ?Jillian Andrade agrees to continue following the Category 2 plan. She will continue to work on weight loss, exercise, and decreasing simple carbohydrates to help decrease the risk of diabetes.  ? ?2. Vitamin D deficiency ?Low Vitamin D level contributes to fatigue and are associated with obesity, breast, and colon cancer. She agrees to continue to take OTC vitamin D and she will follow-up for routine testing of Vitamin D, at least 2-3 times per year to avoid over-replacement. ? ?3. Other hyperlipidemia ?Jillian Andrade agrees to continue taking Crestor and will follow up labs in 4 months. Cardiovascular risk and specific lipid/LDL goals reviewed.  We discussed several lifestyle modifications today and Jillian Andrade will continue to work on diet, exercise and weight loss efforts. Orders and follow up as documented in patient record.  ? ?Counseling ?Intensive lifestyle modifications are the first line treatment for this issue. ?Dietary changes: Increase soluble fiber. Decrease simple carbohydrates. ?Exercise changes: Moderate to vigorous-intensity aerobic activity 150 minutes per week if tolerated. ?Lipid-lowering medications: see documented in medical record. ? ?4. Obesity with  current BMI of 32.1 ?[name] is currently in the action stage of change. As such, her goal is to continue with weight loss efforts. She has agreed to the Category 2 Plan or keeping a food journal and adhering to recommended goals of 1150 to 1250 calories and 80+ grams of protein.  ? ?Exercise goals: All adults should avoid inactivity. Some physical activity is better  than none, and adults who participate in any amount of physical activity gain some health benefits. ? ?Behavioral modification strategies: increasing lean protein intake, meal planning and cooking strategies, keeping healthy foods in the home, planning for success, and keeping a strict food journal. ? ?Jillian Andrade has agreed to follow-up with our clinic in 2 weeks. She was informed of the importance of frequent follow-up visits to maximize her success with intensive lifestyle modifications for her multiple health conditions.  ? ?Objective:  ? ?Blood pressure (!) 146/77, pulse (!) 59, temperature 99 ?F (37.2 ?C), height '5\' 4"'$  (1.626 m), weight 187 lb (84.8 kg), SpO2 99 %. ?Body mass index is 32.1 kg/m?. ? ?General: Cooperative, alert, well developed, in no acute distress. ?HEENT: Conjunctivae and lids unremarkable. ?Cardiovascular: Regular rhythm.  ?Lungs: Normal work of breathing. ?Neurologic: No focal deficits.  ? ?Lab Results  ?Component Value Date  ? CREATININE 0.90 12/22/2021  ? BUN 11 12/22/2021  ? NA 140 12/22/2021  ? K 4.1 12/22/2021  ? CL 103 12/22/2021  ? CO2 21 12/22/2021  ? ?Lab Results  ?Component Value Date  ? ALT 23 12/22/2021  ? AST 33 12/22/2021  ? ALKPHOS 68 12/22/2021  ? BILITOT 0.5 12/22/2021  ? ?Lab Results  ?Component Value Date  ? HGBA1C 5.9 (H) 12/22/2021  ? HGBA1C 6.1 09/22/2021  ? HGBA1C 6.0 09/12/2020  ? HGBA1C 6.0 08/31/2019  ? HGBA1C 6.1 03/13/2019  ? ?Lab Results  ?Component Value Date  ? INSULIN 6.9 12/22/2021  ? ?Lab Results  ?Component Value Date  ? TSH 0.662 12/22/2021  ? ?Lab Results  ?Component Value Date  ? CHOL 144 12/22/2021  ? HDL 64 12/22/2021  ? [place] 68 12/22/2021  ? TRIG 54 12/22/2021  ? CHOLHDL 2 09/22/2021  ? ?Lab Results  ?Component Value Date  ? VD25OH 90.0 12/22/2021  ? VD25OH 110.26 (HH) 09/22/2021  ? VD25OH 99.60 09/12/2020  ? ?Lab Results  ?Component Value Date  ? WBC 6.3 12/22/2021  ? HGB 13.4 12/22/2021  ? HCT 40.9 12/22/2021  ? MCV 90 12/22/2021  ? PLT 142 (L)  12/22/2021  ? ?Lab Results  ?Component Value Date  ? IRON 56 03/13/2019  ? ?Attestation Statements:  ? ?Reviewed by clinician on day of visit: allergies, medications, problem list, medical history, surgical history, family history, social history, and previous encounter notes. ? ?I, Marcille Blanco, CMA, am acting as transcriptionist for Coralie Common, MD ?I have reviewed the above documentation for accuracy and completeness, and I agree with the above. Coralie Common, MD ? ?

## 2022-01-22 ENCOUNTER — Ambulatory Visit (INDEPENDENT_AMBULATORY_CARE_PROVIDER_SITE_OTHER): Payer: 59 | Admitting: Nurse Practitioner

## 2022-01-22 ENCOUNTER — Encounter (INDEPENDENT_AMBULATORY_CARE_PROVIDER_SITE_OTHER): Payer: Self-pay | Admitting: Nurse Practitioner

## 2022-01-22 VITALS — BP 138/77 | HR 62 | Temp 98.4°F | Ht 64.0 in | Wt 187.0 lb

## 2022-01-22 DIAGNOSIS — Z6832 Body mass index (BMI) 32.0-32.9, adult: Secondary | ICD-10-CM | POA: Diagnosis not present

## 2022-01-22 DIAGNOSIS — R7303 Prediabetes: Secondary | ICD-10-CM

## 2022-01-22 DIAGNOSIS — E669 Obesity, unspecified: Secondary | ICD-10-CM

## 2022-01-22 DIAGNOSIS — E559 Vitamin D deficiency, unspecified: Secondary | ICD-10-CM | POA: Diagnosis not present

## 2022-01-24 NOTE — Progress Notes (Signed)
? ? ? ?Chief Complaint:  ? ?OBESITY ?Jillian Andrade is here to discuss her progress with her obesity treatment plan along with follow-up of her obesity related diagnoses. Jillian Andrade is on the Category 2 Plan and keeping a food journal and adhering to recommended goals of 1150-1200 calories and 80 grams of protein and states she is following her eating plan approximately 95% of the time. Jillian Andrade states she is floor exercise and YouTube for 30-60 minutes 2 times per week. ? ?Today's visit was #: 3 ?Starting weight: 192 lbs ?Starting date: 12/22/2021 ?Today's weight: 187 lbs ?Today's date: 01/22/2022 ?Total lbs lost to date: 5 lbs ?Total lbs lost since last in-office visit: 0 ? ?Interim History:  Jillian Andrade is doing better following the meal plan. She is meeting calories and protein goals. She had 1-2 days where she struggled with meeting calories/protein at dinner. She is focusing on eating more protein. She denies hungry but notes occasional cravings. She works 3 days per week 12 hour shifts and struggles with following the meal plan when at work.  ? ?Subjective:  ? ?1. Vitamin D deficiency ?Jillian Andrade's last Vitamin D was high at 90. She is currently taking Vitamin D over the counter 2,000 IU and a multivitamin. She denies nausea, vomiting, and muscle weakness.  ? ?2. Prediabetes ?Jillian Andrade's last A1C looked better at 5.9. She has never been on medications.  ? ?Assessment/Plan:  ? ?1. Vitamin D deficiency ?Low Vitamin D level contributes to fatigue and are associated with obesity, breast, and colon cancer. [name] agrees to continue to take over the counter Vitamin D 1,000 IU daily and she will follow-up for routine testing of Vitamin D, at least 2-3 times per year to avoid over-replacement. We will continue to monitor.  ? ?2. Prediabetes ?Jillian Andrade will continue to work on weight loss, exercise, and decreasing simple carbohydrates to help decrease the risk of diabetes.  ? ?3. Obesity with current BMI of 32.1 ?Jillian Andrade is currently in the action  stage of change. As such, her goal is to continue with weight loss efforts. She has agreed to the Category 2 Plan and keeping a food journal and adhering to recommended goals of 1150-1200 calories and 80 grams of protein.  ? ?Handouts: Protein that can be substituted for 2 ounces of meat. And Protein contents of food was provided today.  ? ?Exercise goals:  As is. ? ?Behavioral modification strategies: increasing lean protein intake, increasing water intake, and no skipping meals. ? ?Jillian Andrade has agreed to follow-up with our clinic in 2 weeks. She was informed of the importance of frequent follow-up visits to maximize her success with intensive lifestyle modifications for her multiple health conditions.  ? ?Objective:  ? ?Blood pressure 138/77, pulse 62, temperature 98.4 ?F (36.9 ?C), height '5\' 4"'$  (1.626 m), weight 187 lb (84.8 kg), SpO2 98 %. ?Body mass index is 32.1 kg/m?. ? ?General: Cooperative, alert, well developed, in no acute distress. ?HEENT: Conjunctivae and lids unremarkable. ?Cardiovascular: Regular rhythm.  ?Lungs: Normal work of breathing. ?Neurologic: No focal deficits.  ? ?Lab Results  ?Component Value Date  ? CREATININE 0.90 12/22/2021  ? BUN 11 12/22/2021  ? NA 140 12/22/2021  ? K 4.1 12/22/2021  ? CL 103 12/22/2021  ? CO2 21 12/22/2021  ? ?Lab Results  ?Component Value Date  ? ALT 23 12/22/2021  ? AST 33 12/22/2021  ? ALKPHOS 68 12/22/2021  ? BILITOT 0.5 12/22/2021  ? ?Lab Results  ?Component Value Date  ? HGBA1C 5.9 (H) 12/22/2021  ?  HGBA1C 6.1 09/22/2021  ? HGBA1C 6.0 09/12/2020  ? HGBA1C 6.0 08/31/2019  ? HGBA1C 6.1 03/13/2019  ? ?Lab Results  ?Component Value Date  ? INSULIN 6.9 12/22/2021  ? ?Lab Results  ?Component Value Date  ? TSH 0.662 12/22/2021  ? ?Lab Results  ?Component Value Date  ? CHOL 144 12/22/2021  ? HDL 64 12/22/2021  ? [place] 68 12/22/2021  ? TRIG 54 12/22/2021  ? CHOLHDL 2 09/22/2021  ? ?Lab Results  ?Component Value Date  ? VD25OH 90.0 12/22/2021  ? VD25OH 110.26 (HH)  09/22/2021  ? VD25OH 99.60 09/12/2020  ? ?Lab Results  ?Component Value Date  ? WBC 6.3 12/22/2021  ? HGB 13.4 12/22/2021  ? HCT 40.9 12/22/2021  ? MCV 90 12/22/2021  ? PLT 142 (L) 12/22/2021  ? ?Lab Results  ?Component Value Date  ? IRON 56 03/13/2019  ? ?Attestation Statements:  ? ?Reviewed by clinician on day of visit: allergies, medications, problem list, medical history, surgical history, family history, social history, and previous encounter notes. ? ?I, Lizbeth Bark, RMA, am acting as Location manager for Everardo Pacific, FNP. ? ?I have reviewed the above documentation for accuracy and completeness, and I agree with the above. Everardo Pacific, FNP  ?

## 2022-01-29 DIAGNOSIS — M5414 Radiculopathy, thoracic region: Secondary | ICD-10-CM | POA: Diagnosis not present

## 2022-02-02 DIAGNOSIS — R350 Frequency of micturition: Secondary | ICD-10-CM | POA: Diagnosis not present

## 2022-02-02 DIAGNOSIS — R3915 Urgency of urination: Secondary | ICD-10-CM | POA: Diagnosis not present

## 2022-02-08 NOTE — Progress Notes (Signed)
?TeleHealth Visit:  ?This visit was completed with telemedicine (audio/video) technology. ?Jillian Andrade has verbally consented to this TeleHealth visit. The patient is located at home, the provider is located at home. The participants in this visit include the listed provider and patient. The visit was conducted today via MyChart video. ? ?OBESITY ?Jillian Andrade is here to discuss her progress with her obesity treatment plan along with follow-up of her obesity related diagnoses.  ? ?Today's visit was # 4 ?Starting weight: 192 lbs ?Starting date: 12/22/2021 ?Weight at last in office visit: 187 lbs on 01/22/22 ?Total weight loss: 5 lbs at last in office visit on 01/22/22. ?Today's reported weight: 181 lbs  ? ?Nutrition Plan:  She has agreed to the Category 2 Plan or keeping a food journal and adhering to recommended goals of 1150-1200 calories and 80 grams of protein.  -90% of the time ?Hunger is well controlled. Cravings are well controlled.  ?Current exercise:  You Tube videos when she is able.  ? ?Interim History: Jillian Andrade is following category 2 rather than journaling most of the time.  Jillian Andrade notes she is able to do much better on the plan on her days off.  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  She often does not have time to eat.  However she does feel she is doing better with getting in more protein on her workdays. ?She tries to exercise using YouTube videos when she can.  However she suffers from back and neck issues.  She had neck surgery in 2017 and had a recent injection in her back. ? ?Assessment/Plan:  ?1. Prediabetes ?Anelle has a diagnosis of prediabetes based on her elevated HgA1c. ?She denies polyphagia. ?Medication(s): none ?Lab Results  ?Component Value Date  ? HGBA1C 5.9 (H) 12/22/2021  ? ?Lab Results  ?Component Value Date  ? INSULIN 6.9 12/22/2021  ? ? ?Plan: ?Continue to work on increasing protein and decreasing simple carbohydrates. ? ?2. Obesity: Current  BMI 31.05 ?Jillian Andrade is currently in the action stage of change. As such, her goal is to Category 2 Plan or keeping a food journal and adhering to recommended goals of 1150-1200 calories and 80 grams of protein. . .  ?She has agreed to Category 2 Plan or keeping a food journal and adhering to recommended goals of 1150-1200 calories and 80 grams of protein. ? ?Exercise goals: All adults should avoid inactivity. Some physical activity is better than none, and adults who participate in any amount of physical activity gain some health benefits. ? ?Behavioral modification strategies: increasing lean protein intake, decreasing simple carbohydrates, and no skipping meals. ? ?Natalee has agreed to follow-up with our clinic in 3 weeks.  ? ?No orders of the defined types were placed in this encounter. ? ? ?There are no discontinued medications.  ? ?No orders of the defined types were placed in this encounter. ?   ? ?Objective:  ? ?VITALS: Per patient if applicable, see vitals. ?GENERAL: Alert and in no acute distress. ?CARDIOPULMONARY: No increased WOB. Speaking in clear sentences.  ?PSYCH: Pleasant and cooperative. Speech normal rate and rhythm. Affect is appropriate. Insight and judgement are appropriate. Attention is focused, linear, and appropriate.  ?NEURO: Oriented as arrived to appointment on time with no prompting.  ? ?Lab Results  ?Component Value Date  ? CREATININE 0.90 12/22/2021  ? BUN 11 12/22/2021  ? NA 140 12/22/2021  ? K 4.1 12/22/2021  ? CL 103 12/22/2021  ? CO2  21 12/22/2021  ? ?Lab Results  ?Component Value Date  ? ALT 23 12/22/2021  ? AST 33 12/22/2021  ? ALKPHOS 68 12/22/2021  ? BILITOT 0.5 12/22/2021  ? ?Lab Results  ?Component Value Date  ? HGBA1C 5.9 (H) 12/22/2021  ? HGBA1C 6.1 09/22/2021  ? HGBA1C 6.0 09/12/2020  ? HGBA1C 6.0 08/31/2019  ? HGBA1C 6.1 03/13/2019  ? ?Lab Results  ?Component Value Date  ? INSULIN 6.9 12/22/2021  ? ?Lab Results  ?Component Value Date  ? TSH 0.662 12/22/2021  ? ?Lab Results   ?Component Value Date  ? CHOL 144 12/22/2021  ? HDL 64 12/22/2021  ? [place] 68 12/22/2021  ? TRIG 54 12/22/2021  ? CHOLHDL 2 09/22/2021  ? ?Lab Results  ?Component Value Date  ? WBC 6.3 12/22/2021  ? HGB 13.4 12/22/2021  ? HCT 40.9 12/22/2021  ? MCV 90 12/22/2021  ? PLT 142 (L) 12/22/2021  ? ?Lab Results  ?Component Value Date  ? IRON 56 03/13/2019  ? ?Lab Results  ?Component Value Date  ? VD25OH 90.0 12/22/2021  ? VD25OH 110.26 (HH) 09/22/2021  ? VD25OH 99.60 09/12/2020  ? ? ?Attestation Statements:  ? ?Reviewed by clinician on day of visit: allergies, medications, problem list, medical history, surgical history, family history, social history, and previous encounter notes. ? ? ?

## 2022-02-12 ENCOUNTER — Telehealth (INDEPENDENT_AMBULATORY_CARE_PROVIDER_SITE_OTHER): Payer: 59 | Admitting: Family Medicine

## 2022-02-12 ENCOUNTER — Encounter (INDEPENDENT_AMBULATORY_CARE_PROVIDER_SITE_OTHER): Payer: Self-pay | Admitting: Family Medicine

## 2022-02-12 ENCOUNTER — Other Ambulatory Visit (HOSPITAL_COMMUNITY): Payer: Self-pay

## 2022-02-12 VITALS — Ht 64.0 in | Wt 181.0 lb

## 2022-02-12 DIAGNOSIS — R7303 Prediabetes: Secondary | ICD-10-CM | POA: Diagnosis not present

## 2022-02-12 DIAGNOSIS — E669 Obesity, unspecified: Secondary | ICD-10-CM

## 2022-02-12 DIAGNOSIS — Z6831 Body mass index (BMI) 31.0-31.9, adult: Secondary | ICD-10-CM | POA: Diagnosis not present

## 2022-02-26 ENCOUNTER — Ambulatory Visit (INDEPENDENT_AMBULATORY_CARE_PROVIDER_SITE_OTHER): Payer: 59 | Admitting: Nurse Practitioner

## 2022-02-26 ENCOUNTER — Encounter (INDEPENDENT_AMBULATORY_CARE_PROVIDER_SITE_OTHER): Payer: Self-pay | Admitting: Nurse Practitioner

## 2022-02-26 VITALS — BP 130/69 | HR 87 | Temp 98.1°F | Ht 64.0 in | Wt 181.0 lb

## 2022-02-26 DIAGNOSIS — Z6831 Body mass index (BMI) 31.0-31.9, adult: Secondary | ICD-10-CM

## 2022-02-26 DIAGNOSIS — D696 Thrombocytopenia, unspecified: Secondary | ICD-10-CM | POA: Diagnosis not present

## 2022-02-26 DIAGNOSIS — E669 Obesity, unspecified: Secondary | ICD-10-CM

## 2022-02-26 DIAGNOSIS — E7849 Other hyperlipidemia: Secondary | ICD-10-CM | POA: Diagnosis not present

## 2022-02-28 NOTE — Progress Notes (Signed)
Chief Complaint:   OBESITY Jillian Andrade is here to discuss her progress with her obesity treatment plan along with follow-up of her obesity related diagnoses. Jillian Andrade is on the Category 2 Plan and keeping a food journal and adhering to recommended goals of 1150-1200 calories and 80 grams of  protein and states she is following her eating plan approximately 95% of the time. Jillian Andrade states she is doing [employer] and dancing for 45 minutes 3-4 times per week.  Today's visit was #: 5 Starting weight: 192 lbs Starting date: 12/22/2021 Today's weight: 181 lbs Today's date: 02/26/2022 Total lbs lost to date: 11 lbs Total lbs lost since last in-office visit: 0  Interim History: Jillian Andrade tries to follow her plan at work but can struggle due to her work hours. She is making healthy choices and watches her portion sizes. She keeps a mental record of calories and averages around 1200 calories daily. She is meeting her protein goals. Her best friend is currently in Hospice and had a family member to pass away and her aunt is sick. She has been driving to [place] to spend time with her aunt. She has very supportive friends and family; She denies stress eating.  Subjective:   1. Other hyperlipidemia Jillian Andrade is currently taking Crestor 20 mg. She denies side effects.   2. Thrombocytopenia ([place]) Jillian Andrade is seeing her primary care physician on a regular basis.   Assessment/Plan:   1. Other hyperlipidemia Cardiovascular risk and specific lipid/LDL goals reviewed.  Jillian Andrade will continue to follow up with her primary care physician. She will continue her medications as directed. We discussed several lifestyle modifications today and Jillian Andrade will continue to work on diet, exercise and weight loss efforts. Orders and follow up as documented in patient record.   Counseling Intensive lifestyle modifications are the first line treatment for this issue. Dietary changes: Increase soluble fiber. Decrease simple  carbohydrates. Exercise changes: Moderate to vigorous-intensity aerobic activity 150 minutes per week if tolerated. Lipid-lowering medications: see documented in medical record.  2. Thrombocytopenia (Jillian Andrade) Jillian Andrade plans to see primary care physician for follow up and labs in June.   3. Obesity with current BMI of 31.1 Jillian Andrade is currently in the action stage of change. As such, her goal is to continue with weight loss efforts. She has agreed to the Category 2 Plan.   Exercise goals:  As is.  Behavioral modification strategies: increasing water intake, no skipping meals, and meal planning and cooking strategies.  Jillian Andrade has agreed to follow-up with our clinic in 3 weeks. She was informed of the importance of frequent follow-up visits to maximize her success with intensive lifestyle modifications for her multiple health conditions.   Objective:   Blood pressure 130/69, pulse 87, temperature 98.1 F (36.7 C), height '5\' 4"'$  (1.626 m), weight 181 lb (82.1 kg), SpO2 94 %. Body mass index is 31.07 kg/m.  General: Cooperative, alert, well developed, in no acute distress. HEENT: Conjunctivae and lids unremarkable. Cardiovascular: Regular rhythm.  Lungs: Normal work of breathing. Neurologic: No focal deficits.   Lab Results  Component Value Date   CREATININE 0.90 12/22/2021   BUN 11 12/22/2021   NA 140 12/22/2021   K 4.1 12/22/2021   CL 103 12/22/2021   CO2 21 12/22/2021   Lab Results  Component Value Date   ALT 23 12/22/2021   AST 33 12/22/2021   ALKPHOS 68 12/22/2021   BILITOT 0.5 12/22/2021   Lab Results  Component Value Date   HGBA1C  5.9 (H) 12/22/2021   HGBA1C 6.1 09/22/2021   HGBA1C 6.0 09/12/2020   HGBA1C 6.0 08/31/2019   HGBA1C 6.1 03/13/2019   Lab Results  Component Value Date   INSULIN 6.9 12/22/2021   Lab Results  Component Value Date   TSH 0.662 12/22/2021   Lab Results  Component Value Date   CHOL 144 12/22/2021   HDL 64 12/22/2021   LDLCALC 68  12/22/2021   TRIG 54 12/22/2021   CHOLHDL 2 09/22/2021   Lab Results  Component Value Date   VD25OH 90.0 12/22/2021   VD25OH 110.26 (HH) 09/22/2021   VD25OH 99.60 09/12/2020   Lab Results  Component Value Date   WBC 6.3 12/22/2021   HGB 13.4 12/22/2021   HCT 40.9 12/22/2021   MCV 90 12/22/2021   PLT 142 (L) 12/22/2021   Lab Results  Component Value Date   IRON 56 03/13/2019   Attestation Statements:   Reviewed by clinician on day of visit: allergies, medications, problem list, medical history, surgical history, family history, social history, and previous encounter notes.  Time spent on visit including pre-visit chart review and post-visit care and charting was 30 minutes.   I, Lizbeth Bark, RMA, am acting as Location manager for Everardo Pacific, FNP.  I have reviewed the above documentation for accuracy and completeness, and I agree with the above. Everardo Pacific, FNP

## 2022-03-13 DIAGNOSIS — M5414 Radiculopathy, thoracic region: Secondary | ICD-10-CM | POA: Diagnosis not present

## 2022-03-13 DIAGNOSIS — Z6831 Body mass index (BMI) 31.0-31.9, adult: Secondary | ICD-10-CM | POA: Diagnosis not present

## 2022-03-13 DIAGNOSIS — M542 Cervicalgia: Secondary | ICD-10-CM | POA: Diagnosis not present

## 2022-03-13 DIAGNOSIS — M5021 Other cervical disc displacement,  high cervical region: Secondary | ICD-10-CM | POA: Diagnosis not present

## 2022-03-15 ENCOUNTER — Other Ambulatory Visit (HOSPITAL_COMMUNITY): Payer: Self-pay | Admitting: Neurosurgery

## 2022-03-15 ENCOUNTER — Other Ambulatory Visit: Payer: Self-pay | Admitting: Neurosurgery

## 2022-03-15 DIAGNOSIS — M5021 Other cervical disc displacement,  high cervical region: Secondary | ICD-10-CM
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
## 2022-03-19 ENCOUNTER — Encounter (INDEPENDENT_AMBULATORY_CARE_PROVIDER_SITE_OTHER): Payer: Self-pay | Admitting: Family Medicine

## 2022-03-19 ENCOUNTER — Telehealth (INDEPENDENT_AMBULATORY_CARE_PROVIDER_SITE_OTHER): Payer: 59 | Admitting: Family Medicine

## 2022-03-19 VITALS — Ht 64.0 in | Wt 175.0 lb

## 2022-03-19 DIAGNOSIS — E669 Obesity, unspecified: Secondary | ICD-10-CM | POA: Diagnosis not present

## 2022-03-19 DIAGNOSIS — R7303 Prediabetes: Secondary | ICD-10-CM | POA: Insufficient documentation

## 2022-03-19 DIAGNOSIS — Z6830 Body mass index (BMI) 30.0-30.9, adult: Secondary | ICD-10-CM

## 2022-03-22 ENCOUNTER — Other Ambulatory Visit (HOSPITAL_COMMUNITY): Payer: Self-pay

## 2022-03-24 ENCOUNTER — Ambulatory Visit (HOSPITAL_COMMUNITY)
Admission: RE | Admit: 2022-03-24 | Discharge: 2022-03-24 | Disposition: A | Discharge status: Home / Self Care | Payer: 59 | Source: Ambulatory Visit | Attending: Neurosurgery | Admitting: Neurosurgery

## 2022-03-24 DIAGNOSIS — M50221 Other cervical disc displacement at C4-C5 level: Secondary | ICD-10-CM | POA: Diagnosis not present

## 2022-03-24 DIAGNOSIS — M5021 Other cervical disc displacement,  high cervical region: Secondary | ICD-10-CM | POA: Diagnosis not present

## 2022-03-24 DIAGNOSIS — M4602 Spinal enthesopathy, cervical region: Secondary | ICD-10-CM | POA: Diagnosis not present

## 2022-03-24 DIAGNOSIS — M47812 Spondylosis without myelopathy or radiculopathy, cervical region: Secondary | ICD-10-CM | POA: Diagnosis not present

## 2022-03-24 DIAGNOSIS — R531 Weakness: Secondary | ICD-10-CM | POA: Diagnosis not present

## 2022-03-27 DIAGNOSIS — M5414 Radiculopathy, thoracic region: Secondary | ICD-10-CM | POA: Diagnosis not present

## 2022-04-09 ENCOUNTER — Ambulatory Visit (INDEPENDENT_AMBULATORY_CARE_PROVIDER_SITE_OTHER): Payer: 59 | Admitting: Nurse Practitioner

## 2022-04-13 ENCOUNTER — Encounter: Payer: Self-pay | Admitting: Internal Medicine

## 2022-04-13 ENCOUNTER — Ambulatory Visit: Payer: 59 | Admitting: Internal Medicine

## 2022-04-13 ENCOUNTER — Other Ambulatory Visit (HOSPITAL_COMMUNITY): Payer: Self-pay

## 2022-04-13 VITALS — BP 114/64 | HR 49 | Temp 98.5°F | Ht 64.0 in | Wt 175.0 lb

## 2022-04-13 DIAGNOSIS — R001 Bradycardia, unspecified: Secondary | ICD-10-CM

## 2022-04-13 DIAGNOSIS — Z0001 Encounter for general adult medical examination with abnormal findings: Secondary | ICD-10-CM | POA: Diagnosis not present

## 2022-04-13 DIAGNOSIS — E538 Deficiency of other specified B group vitamins: Secondary | ICD-10-CM

## 2022-04-13 DIAGNOSIS — R7303 Prediabetes: Secondary | ICD-10-CM | POA: Diagnosis not present

## 2022-04-13 DIAGNOSIS — E7800 Pure hypercholesterolemia, unspecified: Secondary | ICD-10-CM | POA: Diagnosis not present

## 2022-04-13 DIAGNOSIS — R739 Hyperglycemia, unspecified: Secondary | ICD-10-CM

## 2022-04-13 DIAGNOSIS — N644 Mastodynia: Secondary | ICD-10-CM | POA: Diagnosis not present

## 2022-04-13 DIAGNOSIS — E559 Vitamin D deficiency, unspecified: Secondary | ICD-10-CM

## 2022-04-13 DIAGNOSIS — M5412 Radiculopathy, cervical region: Secondary | ICD-10-CM

## 2022-04-13 MED ORDER — ROSUVASTATIN CALCIUM 20 MG PO TABS
20.0000 mg | ORAL_TABLET | Freq: Every day | ORAL | 3 refills | Status: DC
  Filled 2022-04-13 – 2022-05-07 (×2): qty 90, 90d supply, fill #0
  Filled 2022-08-13: qty 90, 90d supply, fill #1
  Filled 2022-11-12: qty 90, 90d supply, fill #2
  Filled 2023-02-07: qty 90, 90d supply, fill #3

## 2022-04-13 MED ORDER — PANTOPRAZOLE SODIUM 20 MG PO TBEC
20.0000 mg | DELAYED_RELEASE_TABLET | Freq: Every morning | ORAL | 3 refills | Status: DC
  Filled 2022-04-13: qty 90, 90d supply, fill #0
  Filled 2022-06-01 – 2022-08-30 (×2): qty 90, 90d supply, fill #1
  Filled 2022-11-27: qty 90, 90d supply, fill #2
  Filled 2023-03-06: qty 90, 90d supply, fill #3

## 2022-04-13 NOTE — Progress Notes (Unsigned)
Patient ID: Jillian Andrade, female   DOB: 1960/12/25, 61 y.o.   MRN: [id number]         Chief Complaint:: wellness exam and bradycardia, b12 deficiency, left breast pain, left cervical radiculopathy       HPI:  Jillian Andrade is a 62 y.o. female here for wellness exam; declines covid booster, o/w up to date                        Also c/o left upper lateral breast pain x 2 wks, mild to mod but persistent, dull, constant, nothing makes better or worse.  Had screening mammogram late 2022 , asking now for f/u exam.  Also has persistent left neck pain and LUE radicular pain, s/p recent MRI worse at C4-5 and has f/u with Dr Saintclair Halsted for consideration cortisone injection soon.  Pt denies chest pain, increased sob or doe, wheezing, orthopnea, PND, increased LE swelling, palpitations, dizziness or syncope.   Pt denies polydipsia, polyuria, or new focal neuro s/s.    Pt denies fever, wt loss, night sweats, loss of appetite, or other constitutional symptoms    Wt Readings from Last 3 Encounters:  04/13/22 175 lb (79.4 kg)  03/19/22 175 lb (79.4 kg)  02/26/22 181 lb (82.1 kg)   BP Readings from Last 3 Encounters:  04/13/22 114/64  02/26/22 130/69  01/22/22 138/77   Immunization History  Administered Date(s) Administered   Influenza,inj,Quad PF,6+ Mos 07/18/2015   Influenza-Unspecified 07/12/2017, 07/28/2018, 08/07/2020, 07/31/2021   PFIZER(Purple Top)SARS-COV-2 Vaccination 10/24/2019, 11/12/2019, 08/12/2020   Tdap 04/12/2015   Zoster Recombinat (Shingrix) 09/29/2021, 12/08/2021   There are no preventive care reminders to display for this patient.     Past Medical History:  Diagnosis Date   Allergic rhinitis 03/06/2019   Anemia    Bilateral swelling of feet    Chest pain    Complication of anesthesia    n/v   Constipation    Epilepsy (HCC)    Fibroids in ovaries    pain in left side   GERD (gastroesophageal reflux disease)    no medications at this time   Hand osteomyelitis ([place])  03/06/2019   Heart murmur    back in the 90s with the mitral valve prolapse   High cholesterol    Insomnia 05/01/2016   Lactose intolerance    Lumbar disc disease 05/01/2016   Multiple food allergies    MVP (mitral valve prolapse) mild, no cardiologist   No pertinent past medical history    PONV (postoperative nausea and vomiting)    SOB (shortness of breath)    Swallowing difficulty    Vitamin D deficiency    Past Surgical History:  Procedure Laterality Date   ABDOMINAL HYSTERECTOMY     ANKLE ARTHROSCOPY  10/31/2011   Procedure: ANKLE ARTHROSCOPY;  Surgeon: Colin Rhein, MD;  Location: [place];  Service: Orthopedics;  Laterality: Right;  RIGHT ANKLE ARTHROSCOPY WITH EXTENSIVE DEBRIDEMENT   ANTERIOR CERVICAL DECOMP/DISCECTOMY FUSION N/A 10/04/2016   Procedure: CERVICAL FIVE-CERVICAL SIX  ANTERIOR CERVICAL DECOMPRESSION/DISCECTOMY/FUSION;  Surgeon: Erline Levine, MD;  Location: [place];  Service: Neurosurgery;  Laterality: N/A;  C5-6 ANTERIOR CERVICAL DECOMPRESSION/DISCECTOMY/FUSION   BIOPSY  11/23/2021   Procedure: BIOPSY;  Surgeon: Wilford Corner, MD;  Location: [facility];  Service: Endoscopy;;   COLONOSCOPY     COLONOSCOPY N/A 03/02/2014   Procedure: COLONOSCOPY;  Surgeon: Cleotis Nipper, MD;  Location: [facility];  Service: Endoscopy;  Laterality:  N/A;  ultra slim scope   COLONOSCOPY WITH PROPOFOL N/A 11/23/2021   Procedure: COLONOSCOPY WITH PROPOFOL;  Surgeon: Wilford Corner, MD;  Location: [facility];  Service: Endoscopy;  Laterality: N/A;   DILATION AND CURETTAGE OF UTERUS     HEMORRHOID SURGERY      reports that she has never smoked. She has been exposed to tobacco smoke. She has never used smokeless tobacco. She reports that she does not drink alcohol and does not use drugs. family history includes Cancer in her father; Glaucoma in her mother; Hyperlipidemia in her mother; Hypertension in her father and mother; Obesity in her father and mother;  Stroke in her father and mother. Allergies  Allergen Reactions   Bee Venom Anaphylaxis   Propoxyphene Nausea And Vomiting    Allergy to Wygesic   Orange Juice [Orange Oil] Hives and Nausea And Vomiting   Oxycodone Hives   Current Outpatient Medications on File Prior to Visit  Medication Sig Dispense Refill   Ascorbic Acid (VITAMIN C) 1000 MG tablet Take 1,000 mg by mouth daily.     Cholecalciferol (VITAMIN D) 50 MCG (2000 UT) CAPS Take 1,000 Units by mouth daily.     cyclobenzaprine (FLEXERIL) 5 MG tablet Take 1 tablet (5 mg total) by mouth 3 (three) times daily as needed for muscle spasms. 40 tablet 2   estradiol (ESTRACE) 1 MG tablet Take 1 tablet (1 mg total) by mouth daily. 90 tablet 3   Multiple Vitamins-Minerals (ALIVE WOMENS ENERGY) TABS Take 1 tablet by mouth daily.     No current facility-administered medications on file prior to visit.        ROS:  All others reviewed and negative.  Objective        PE:  BP 114/64 (BP Location: Left Arm, Patient Position: Sitting, Cuff Size: Large)   Pulse (!) 49   Temp 98.5 F (36.9 C) (Oral)   Ht '5\' 4"'$  (1.626 m)   Wt 175 lb (79.4 kg)   SpO2 98%   BMI 30.04 kg/m                 Constitutional: Pt appears in NAD               HENT: Head: NCAT.                Right Ear: External ear normal.                 Left Ear: External ear normal.                Eyes: . Pupils are equal, round, and reactive to light. Conjunctivae and EOM are normal               Nose: without d/c or deformity               Neck: Neck supple. Gross normal ROM               Cardiovascular: Normal rate and regular rhythm.                 Pulmonary/Chest: Effort normal and breath sounds without rales or wheezing.                Abd:  Soft, NT, ND, + BS, no organomegaly               Neurological: Pt is alert. At baseline orientation, motor grossly intact  Skin: Skin is warm. No rashes, no other new lesions, LE edema - none                Psychiatric: Pt behavior is normal without agitation   Micro: none  Cardiac tracings I have personally interpreted today:  none  Pertinent Radiological findings (summarize): none   Lab Results  Component Value Date   WBC 6.3 12/22/2021   HGB 13.4 12/22/2021   HCT 40.9 12/22/2021   PLT 142 (L) 12/22/2021   GLUCOSE 81 12/22/2021   CHOL 144 12/22/2021   TRIG 54 12/22/2021   HDL 64 12/22/2021   LDLCALC 68 12/22/2021   ALT 23 12/22/2021   AST 33 12/22/2021   NA 140 12/22/2021   K 4.1 12/22/2021   CL 103 12/22/2021   CREATININE 0.90 12/22/2021   BUN 11 12/22/2021   CO2 21 12/22/2021   TSH 0.662 12/22/2021   HGBA1C 5.9 (H) 12/22/2021   Assessment/Plan:  Jillian Andrade is a 61 y.o. Black or African American [2] female with  has a past medical history of Allergic rhinitis (03/06/2019), Anemia, Bilateral swelling of feet, Chest pain, Complication of anesthesia, Constipation, Epilepsy ([place]), Fibroids (in ovaries ), GERD (gastroesophageal reflux disease), Hand osteomyelitis ([place]) (03/06/2019), Heart murmur, High cholesterol, Insomnia (05/01/2016), Lactose intolerance, Lumbar disc disease (05/01/2016), Multiple food allergies, MVP (mitral valve prolapse) (mild, no cardiologist), No pertinent past medical history, PONV (postoperative nausea and vomiting), SOB (shortness of breath), Swallowing difficulty, and Vitamin D deficiency.  Encounter for well adult exam with abnormal findings Age and sex appropriate education and counseling updated with regular exercise and diet Referrals for preventative services - none needed Immunizations addressed - declines covid booster Smoking counseling  - none needed Evidence for depression or other mood disorder - none significant Most recent labs reviewed. I have personally reviewed and have noted: 1) the patient's medical and social history 2) The patient's current medications and supplements 3) The patient's height, weight, and BMI have been  recorded in the chart   Vitamin D deficiency Last vitamin D Lab Results  Component Value Date   VD25OH 90.0 12/22/2021   Stable, cont oral replacement   Prediabetes Lab Results  Component Value Date   HGBA1C 5.9 (H) 12/22/2021   Stable, pt to continue current medical treatment  - diet, wt control, excercise   Left cervical radiculopathy Recent worsening, sp MRI with worsening c4-5 neuroforaminal, for f/u Dr Saintclair Halsted NS  HLD (hyperlipidemia) Lab Results  Component Value Date   LDLCALC 68 12/22/2021   Stable, pt to continue current statin crestor 20 mg  Breast pain, left Pt for diagnostic mammogram and ultrasound  B12 deficiency Lab Results  Component Value Date   VITAMINB12 1,696 (H) 12/22/2021   Overcontrolled, pt to reduce oral replacement - b12 1000 mcg to mon - wed  fri only  Bradycardia Mild asympt, declines ecg or cardiology for now,  to f/u any worsening symptoms or concerns  Followup: Return in about 1 year (around 04/14/2023).  Cathlean Cower, MD 04/15/2022 7:33 PM [facility]

## 2022-04-13 NOTE — Patient Instructions (Signed)
Ok to decrease the MVI to mon-wed-Friday only  Please continue all other medications as before, and refills have been done if requested.  Please have the pharmacy call with any other refills you may need.  Please continue your efforts at being more active, low cholesterol diet, and weight control.  You are otherwise up to date with prevention measures today.  Please keep your appointments with your specialists as you may have planned - Dr Saintclair Halsted for possible cortisone injection  You will be contacted regarding the referral for: Diagnostic mammogram (with ultrasound)  Please go to the LAB at the blood drawing area for the tests to be done - in 3 MONTHS at the [place] will be contacted by phone if any changes need to be made immediately.  Otherwise, you will receive a letter about your results with an explanation, but please check with MyChart first.  Please remember to sign up for MyChart if you have not done so, as this will be important to you in the future with finding out test results, communicating by private email, and scheduling acute appointments online when needed.  Please make an Appointment to return for your 1 year visit, or sooner if needed

## 2022-04-15 ENCOUNTER — Encounter: Payer: Self-pay | Admitting: Internal Medicine

## 2022-04-15 DIAGNOSIS — R001 Bradycardia, unspecified: Secondary | ICD-10-CM | POA: Insufficient documentation

## 2022-04-15 NOTE — Assessment & Plan Note (Signed)
Lab Results  Component Value Date   HGBA1C 5.9 (H) 12/22/2021   Stable, pt to continue current medical treatment  - diet, wt control, excercise

## 2022-04-15 NOTE — Assessment & Plan Note (Signed)
Lab Results  Component Value Date   LDLCALC 68 12/22/2021   Stable, pt to continue current statin crestor 20 mg

## 2022-04-15 NOTE — Assessment & Plan Note (Signed)
Last vitamin D Lab Results  Component Value Date   VD25OH 90.0 12/22/2021   Stable, cont oral replacement

## 2022-04-15 NOTE — Assessment & Plan Note (Signed)

## 2022-04-15 NOTE — Assessment & Plan Note (Signed)
Pt for diagnostic mammogram and ultrasound

## 2022-04-15 NOTE — Assessment & Plan Note (Signed)
Lab Results  Component Value Date   VITAMINB12 1,696 (H) 12/22/2021   Overcontrolled, pt to reduce oral replacement - b12 1000 mcg to mon - wed  fri only

## 2022-04-15 NOTE — Assessment & Plan Note (Signed)
Mild asympt, declines ecg or cardiology for now,  to f/u any worsening symptoms or concerns

## 2022-04-15 NOTE — Assessment & Plan Note (Signed)
Recent worsening, sp MRI with worsening c4-5 neuroforaminal, for f/u Dr Saintclair Halsted NS

## 2022-04-16 ENCOUNTER — Encounter (INDEPENDENT_AMBULATORY_CARE_PROVIDER_SITE_OTHER): Payer: Self-pay | Admitting: Nurse Practitioner

## 2022-04-16 ENCOUNTER — Ambulatory Visit (INDEPENDENT_AMBULATORY_CARE_PROVIDER_SITE_OTHER): Payer: 59 | Admitting: Nurse Practitioner

## 2022-04-16 VITALS — BP 127/79 | HR 66 | Temp 98.0°F | Ht 64.0 in | Wt 173.0 lb

## 2022-04-16 DIAGNOSIS — Z6829 Body mass index (BMI) 29.0-29.9, adult: Secondary | ICD-10-CM | POA: Diagnosis not present

## 2022-04-16 DIAGNOSIS — E669 Obesity, unspecified: Secondary | ICD-10-CM

## 2022-04-16 DIAGNOSIS — R7989 Other specified abnormal findings of blood chemistry: Secondary | ICD-10-CM | POA: Diagnosis not present

## 2022-04-17 NOTE — Progress Notes (Unsigned)
Chief Complaint:   OBESITY Jillian Andrade is here to discuss her progress with her obesity treatment plan along with follow-up of her obesity related diagnoses. Jillian Andrade is on keeping a food journal and adhering to recommended goals of 1000-1250 calories and 80+ grams of protein daily and states she is following her eating plan approximately 90% of the time. Jillian Andrade states she is doing 0 minutes 0 times per week.  Today's visit was #: 7 Starting weight: 192 lbs Starting date: 12/22/2021 Today's weight: 173 lbs Today's date: 04/16/2022 Total lbs lost to date: 19 Total lbs lost since last in-office visit: 2  Interim History: Jillian Andrade has done well with weight loss since her last visit.  She is struggling with neck and back pain.  She plans to try injections prior to having surgery.  She is struggling with exercising due to pain.  She is averaging around 1150-1200 cal and 80 g of protein daily.  Sometimes she substitutes a protein shake for dinner.  She denies hunger or cravings.  Subjective:   1. High serum vitamin B12 Jillian Andrade was instructed to reduce her multivitamins to Monday, Wednesday, Friday per her PCP.  She is to have labs rechecked in 3 months.  Assessment/Plan:   1. High serum vitamin B12 Jillian Andrade will continue to follow-up with her PCP.  2. Obesity with current BMI of 29.7 Jillian Andrade is currently in the action stage of change. As such, her goal is to continue with weight loss efforts. She has agreed to keeping a food journal and adhering to recommended goals of 1150-1200 calories and 80 grams of protein daily.   Exercise goals: Pool exercises.  Behavioral modification strategies: increasing lean protein intake, increasing water intake, and planning for success.  Jillian Andrade has agreed to follow-up with our clinic in 3 weeks. She was informed of the importance of frequent follow-up visits to maximize her success with intensive lifestyle modifications for her multiple health conditions.    Objective:   Blood pressure 127/79, pulse 66, temperature 98 F (36.7 C), height '5\' 4"'$  (1.626 m), weight 173 lb (78.5 kg), SpO2 96 %. Body mass index is 29.7 kg/m.  General: Cooperative, alert, well developed, in no acute distress. HEENT: Conjunctivae and lids unremarkable. Cardiovascular: Regular rhythm.  Lungs: Normal work of breathing. Neurologic: No focal deficits.   Lab Results  Component Value Date   CREATININE 0.90 12/22/2021   BUN 11 12/22/2021   NA 140 12/22/2021   K 4.1 12/22/2021   CL 103 12/22/2021   CO2 21 12/22/2021   Lab Results  Component Value Date   ALT 23 12/22/2021   AST 33 12/22/2021   ALKPHOS 68 12/22/2021   BILITOT 0.5 12/22/2021   Lab Results  Component Value Date   HGBA1C 5.9 (H) 12/22/2021   HGBA1C 6.1 09/22/2021   HGBA1C 6.0 09/12/2020   HGBA1C 6.0 08/31/2019   HGBA1C 6.1 03/13/2019   Lab Results  Component Value Date   INSULIN 6.9 12/22/2021   Lab Results  Component Value Date   TSH 0.662 12/22/2021   Lab Results  Component Value Date   CHOL 144 12/22/2021   HDL 64 12/22/2021   LDLCALC 68 12/22/2021   TRIG 54 12/22/2021   CHOLHDL 2 09/22/2021   Lab Results  Component Value Date   VD25OH 90.0 12/22/2021   VD25OH 110.26 ([facility]) 09/22/2021   VD25OH 99.60 09/12/2020   Lab Results  Component Value Date   WBC 6.3 12/22/2021   HGB 13.4 12/22/2021   HCT  40.9 12/22/2021   MCV 90 12/22/2021   PLT 142 (L) 12/22/2021   Lab Results  Component Value Date   IRON 56 03/13/2019   Attestation Statements:   Reviewed by clinician on day of visit: allergies, medications, problem list, medical history, surgical history, family history, social history, and previous encounter notes.  Time spent on visit including pre-visit chart review and post-visit care and charting was 30 minutes.    Wilhemena Durie, am acting as Location manager for [employer], FNP-C.  I have reviewed the above documentation for accuracy and  completeness, and I agree with the above. Everardo Pacific, FNP

## 2022-04-30 ENCOUNTER — Other Ambulatory Visit: Payer: Self-pay | Admitting: Internal Medicine

## 2022-04-30 ENCOUNTER — Ambulatory Visit
Admission: RE | Admit: 2022-04-30 | Discharge: 2022-04-30 | Disposition: A | Discharge status: Home / Self Care | Payer: 59 | Source: Ambulatory Visit | Attending: Internal Medicine | Admitting: Internal Medicine

## 2022-04-30 ENCOUNTER — Ambulatory Visit: Payer: 59

## 2022-04-30 DIAGNOSIS — R922 Inconclusive mammogram: Secondary | ICD-10-CM | POA: Diagnosis not present

## 2022-04-30 DIAGNOSIS — N644 Mastodynia: Secondary | ICD-10-CM

## 2022-05-03 NOTE — Progress Notes (Signed)
TeleHealth Visit:  This visit was completed with telemedicine (audio/video) technology. Jillian Andrade has verbally consented to this TeleHealth visit. The patient is located at home, the provider is located at home. The participants in this visit include the listed provider and patient. The visit was conducted today via MyChart video.  OBESITY Jillian Andrade is here to discuss her progress with her obesity treatment plan along with follow-up of her obesity related diagnoses.   Today's visit was # 8 Starting weight: 192 lbs Starting date: 12/22/2021 Weight at last in office visit: 173 lbs on 04/16/22 Total weight loss: 19 lbs at last in office visit on 04/16/22. Today's reported weight: No weight reported.  Nutrition Plan: keeping a food journal and adhering to recommended goals of 1150-1200 calories and 80 gms protein.  Hunger is well controlled. Cravings are moderately controlled.  Current exercise: none  Interim History: Jillian Andrade journals every day and meets protein goals.  Sometimes she is low on calories but always eats at least 1000 cal/day.  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 She denies excessive hunger.  Drinks only water.  Reports she has never been a person who ate much junk food. Still suffering from cervical spine pain.  Assessment/Plan:  1. Prediabetes Last A1c elevated at 5.9 on 12/22/2021. Medication(s): None.  Denies polyphasia. Lab Results  Component Value Date   HGBA1C 5.9 (H) 12/22/2021   Lab Results  Component Value Date   INSULIN 6.9 12/22/2021    Plan: Continue meal plan. Encouraged exercise.   2. Cervical Spine Arthritis She sees Dr. Saintclair Halsted for issues with her cervical spine.  She has had surgery for this in the past and is now planning an injection to avoid another surgery.  However she has lacked the time due to work to get this scheduled.  She also reports back pain and has some degenerative changes  in her thoracic spine. 03/24/2022 cervical spine MRI IMPRESSION: 1. C5-6 ACDF with solid arthrodesis and no recurrent impingement. 2. Asymmetric left-sided facet osteoarthritis and uncovertebral spurring at C2-3 to C4-5, progressed from 2017 with left foraminal impingement at these levels.  Plan: Encouraged her to try to schedule her injection ASAP with Dr. Saintclair Halsted. Encouraged her to consider water exercises-possibly at [facility].   3. Obesity: Current BMI 29.7 Jillian Andrade is currently in the action stage of change. As such, her goal is to continue with weight loss efforts.  She has agreed to keeping a food journal and adhering to recommended goals of 1150-1200 calories and 80 gms protein.   Exercise goals: consider water exercise.  Behavioral modification strategies: planning for success. Encouraged her to get at least at 1000 cal/day.  RMR is 1411 (12/22/2021).  Jillian Andrade has agreed to follow-up with our clinic in 4 weeks.   No orders of the defined types were placed in this encounter.   There are no discontinued medications.   No orders of the defined types were placed in this encounter.     Objective:   VITALS: Per patient if applicable, see vitals. GENERAL: Alert and in no acute distress. CARDIOPULMONARY: No increased WOB. Speaking in clear sentences.  PSYCH: Pleasant and cooperative. Speech normal rate and rhythm. Affect is appropriate. Insight and judgement are appropriate. Attention is focused, linear, and appropriate.  NEURO: Oriented as arrived to appointment on time with no prompting.   Lab Results  Component Value Date   CREATININE 0.90 12/22/2021   BUN 11  12/22/2021   NA 140 12/22/2021   K 4.1 12/22/2021   CL 103 12/22/2021   CO2 21 12/22/2021   Lab Results  Component Value Date   ALT 23 12/22/2021   AST 33 12/22/2021   ALKPHOS 68 12/22/2021   BILITOT 0.5 12/22/2021   Lab Results  Component Value Date   HGBA1C 5.9 (H) 12/22/2021   HGBA1C 6.1 09/22/2021    HGBA1C 6.0 09/12/2020   HGBA1C 6.0 08/31/2019   HGBA1C 6.1 03/13/2019   Lab Results  Component Value Date   INSULIN 6.9 12/22/2021   Lab Results  Component Value Date   TSH 0.662 12/22/2021   Lab Results  Component Value Date   CHOL 144 12/22/2021   HDL 64 12/22/2021   LDLCALC 68 12/22/2021   TRIG 54 12/22/2021   CHOLHDL 2 09/22/2021   Lab Results  Component Value Date   WBC 6.3 12/22/2021   HGB 13.4 12/22/2021   HCT 40.9 12/22/2021   MCV 90 12/22/2021   PLT 142 (L) 12/22/2021   Lab Results  Component Value Date   IRON 56 03/13/2019   Lab Results  Component Value Date   VD25OH 90.0 12/22/2021   VD25OH 110.26 ([place]) 09/22/2021   VD25OH 99.60 09/12/2020    Attestation Statements:   Reviewed by clinician on day of visit: allergies, medications, problem list, medical history, surgical history, family history, social history, and previous encounter notes.  Time spent on visit including the items listed below was 35 minutes.  -preparing to see the patient (e.g., review of tests, history, previous notes) -obtaining and/or reviewing separately obtained history -counseling and educating the patient/family/caregiver -documenting clinical information in the electronic or other health record

## 2022-05-07 ENCOUNTER — Telehealth (INDEPENDENT_AMBULATORY_CARE_PROVIDER_SITE_OTHER): Payer: 59 | Admitting: Family Medicine

## 2022-05-07 ENCOUNTER — Other Ambulatory Visit (HOSPITAL_COMMUNITY): Payer: Self-pay

## 2022-05-07 ENCOUNTER — Encounter (INDEPENDENT_AMBULATORY_CARE_PROVIDER_SITE_OTHER): Payer: Self-pay | Admitting: Family Medicine

## 2022-05-07 VITALS — Ht 64.0 in | Wt 174.0 lb

## 2022-05-07 DIAGNOSIS — M47812 Spondylosis without myelopathy or radiculopathy, cervical region: Secondary | ICD-10-CM | POA: Diagnosis not present

## 2022-05-07 DIAGNOSIS — R7303 Prediabetes: Secondary | ICD-10-CM | POA: Diagnosis not present

## 2022-05-07 DIAGNOSIS — Z6829 Body mass index (BMI) 29.0-29.9, adult: Secondary | ICD-10-CM

## 2022-05-07 DIAGNOSIS — E669 Obesity, unspecified: Secondary | ICD-10-CM | POA: Diagnosis not present

## 2022-05-21 ENCOUNTER — Other Ambulatory Visit (HOSPITAL_COMMUNITY): Payer: Self-pay

## 2022-05-23 ENCOUNTER — Encounter (INDEPENDENT_AMBULATORY_CARE_PROVIDER_SITE_OTHER): Payer: Self-pay

## 2022-05-24 IMAGING — MR MR THORACIC SPINE W/O CM
4 of 6 series · 22 of 48 positions shown · non-contrast
Comparison: None.

CLINICAL DATA: Thoracic spine pain DIF.B (JHF-18-CM). Trunk
numbness or tingling.

EXAM:
MRI THORACIC SPINE WITHOUT CONTRAST
TECHNIQUE: Multiplanar, multisequence MR imaging of the thoracic spine was
performed. No intravenous contrast was administered.

[Series 2: T1 · sagittal · 3.0mm · 1.06mm/px · 3 of 14 slices shown]
[im 1/14]
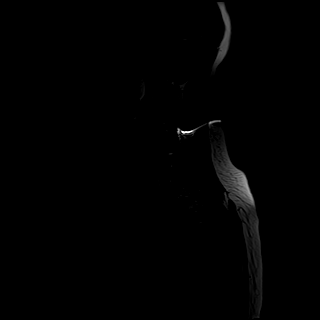
[im 7/14]
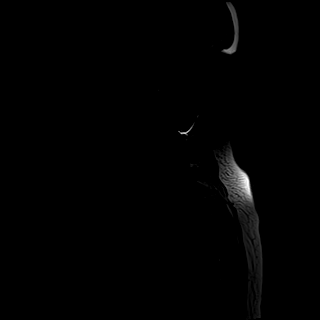
[im 14/14]
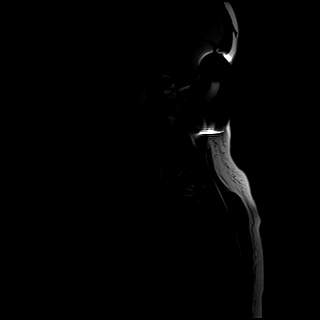

[Series 4: T2 · sagittal · 4.0mm · 0.53mm/px · 5 of 14 slices shown (1 of 3)]
[im 1/14]
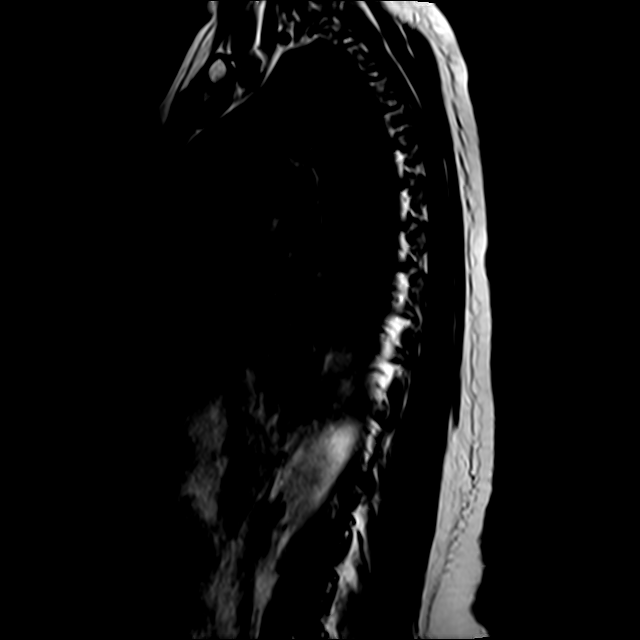
[im 4/14]
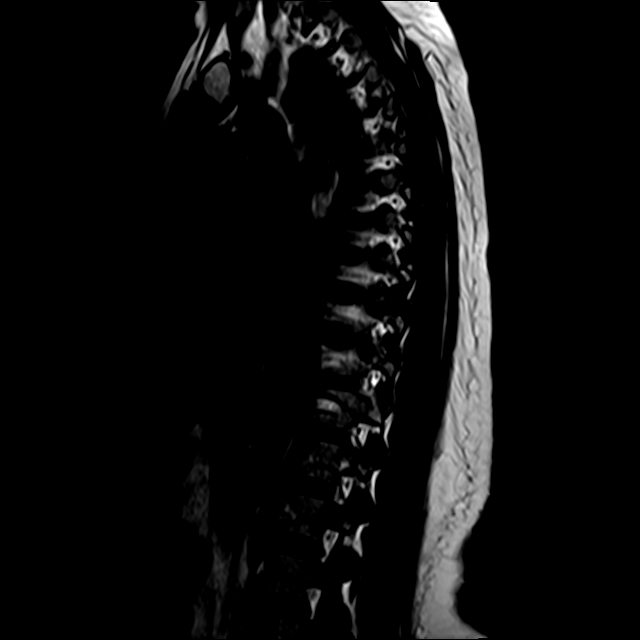
[im 7/14]
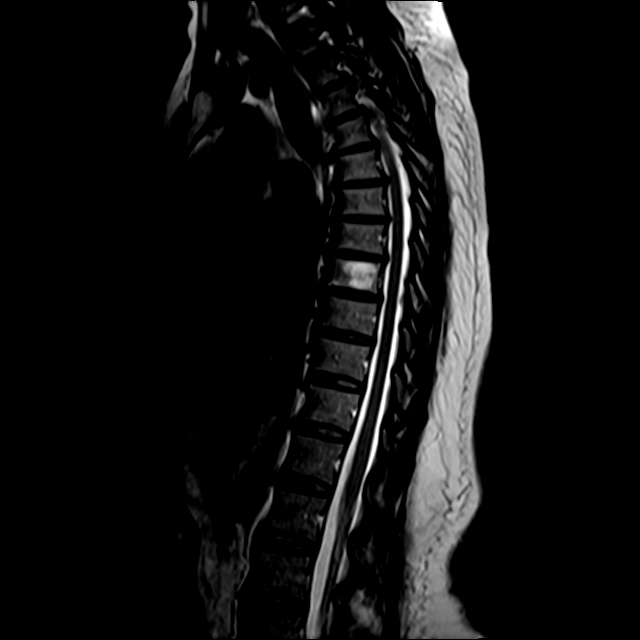
[im 10/14]
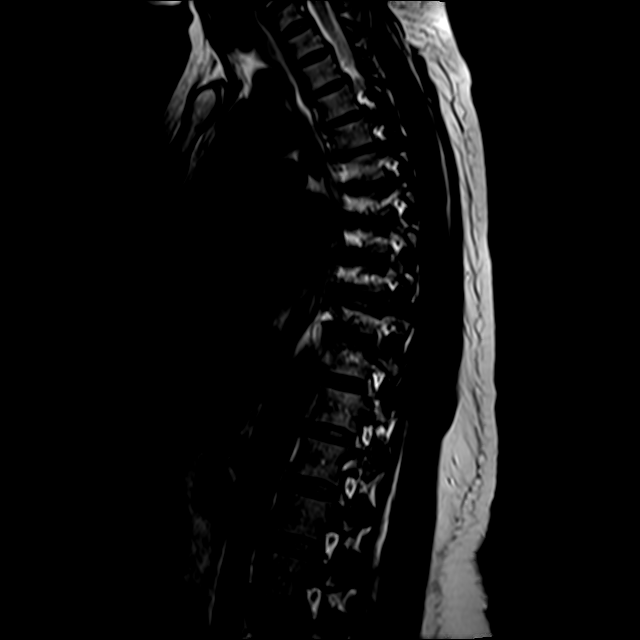
[im 14/14]
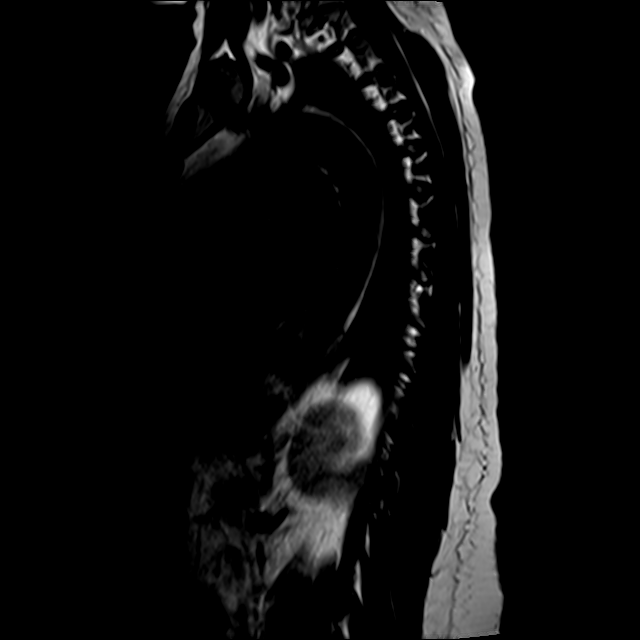

[Series 7: T2 · axial · 4.0mm · 0.39mm/px · z∈[-253,-48]mm · 8 of 39 slices shown (2 of 3)]
[im 1/39]
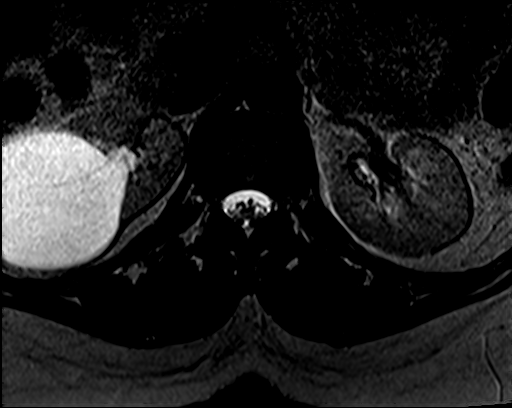
[im 6/39]
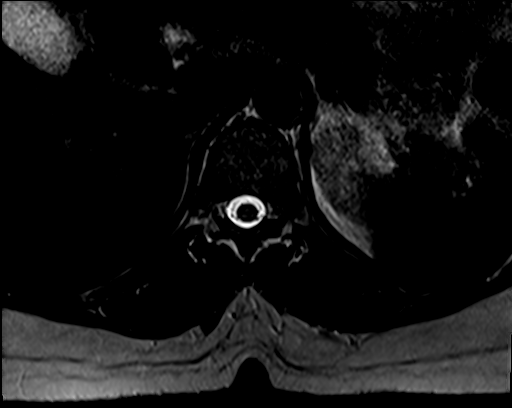
[im 12/39]
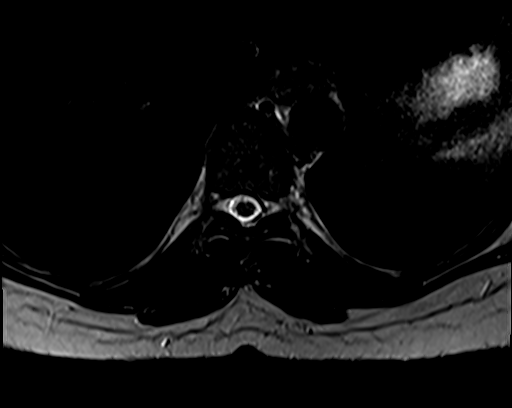
[im 18/39]
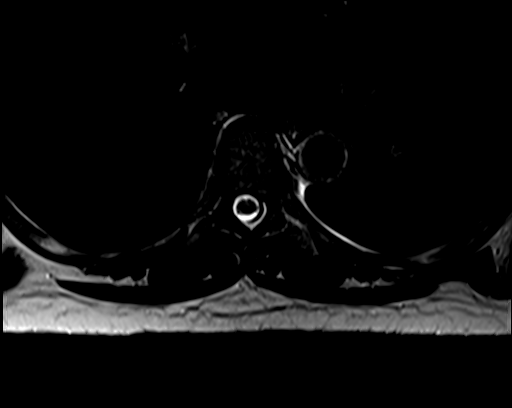
[im 21/39]
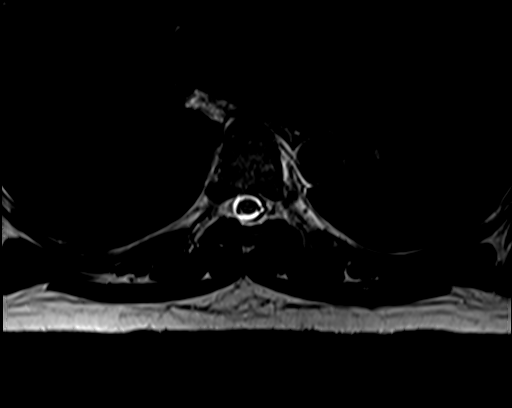
[im 27/39]
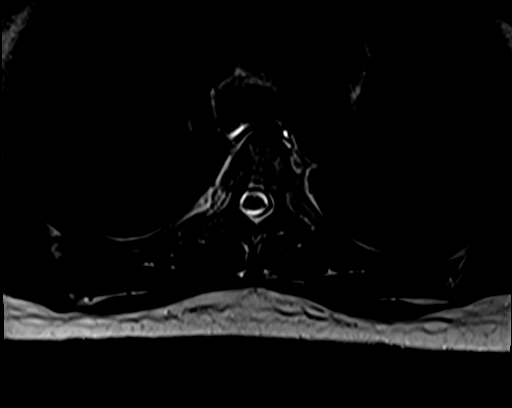
[im 33/39]
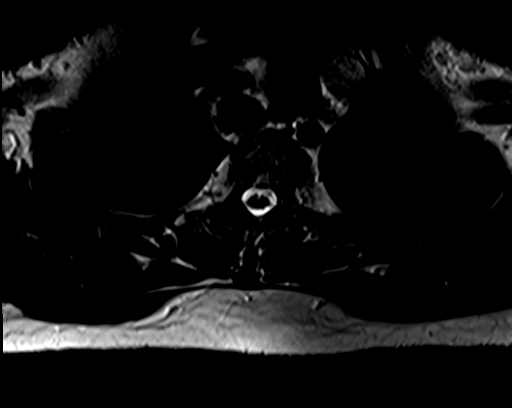
[im 39/39]
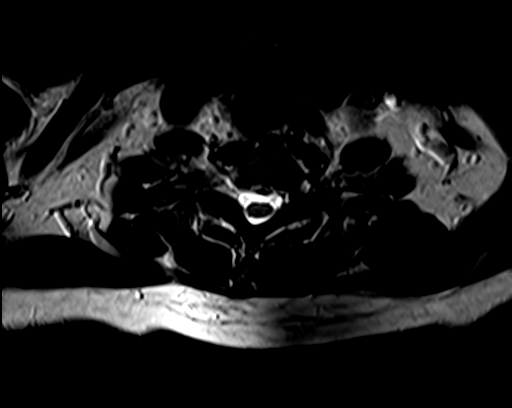

[Series 8: T2 · axial · 4.0mm · 0.39mm/px · z∈[-253,-74]mm · 6 of 39 slices shown (3 of 3)]
[im 1/39]
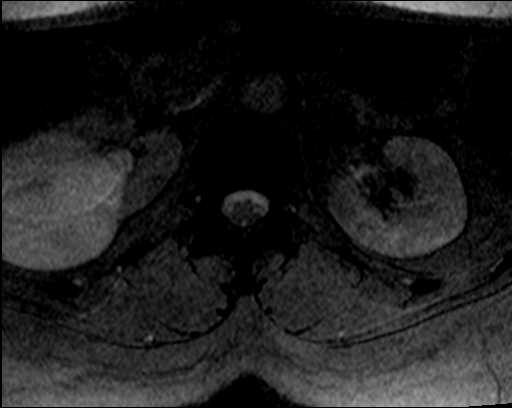
[im 6/39]
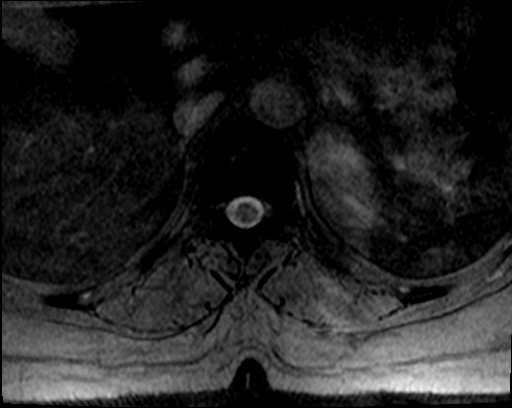
[im 12/39]
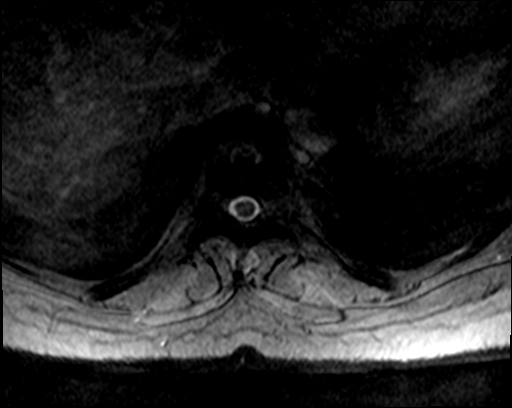
[im 18/39]
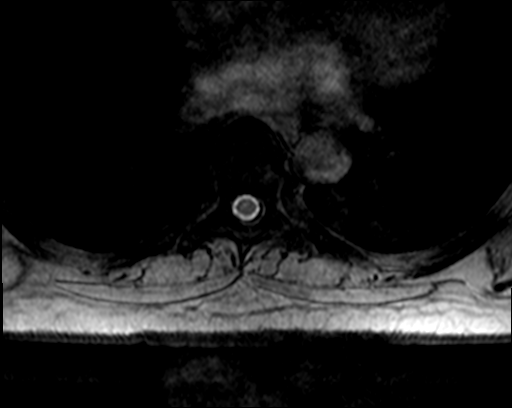
[im 21/39]
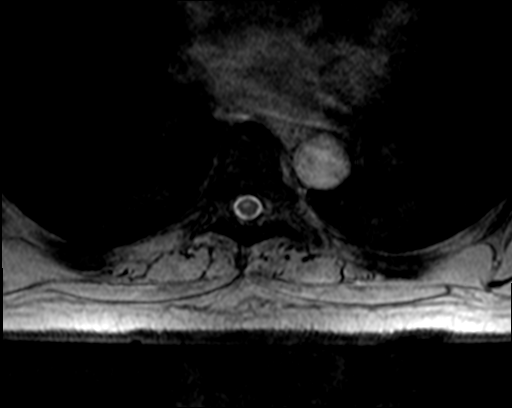
[im 33/39]
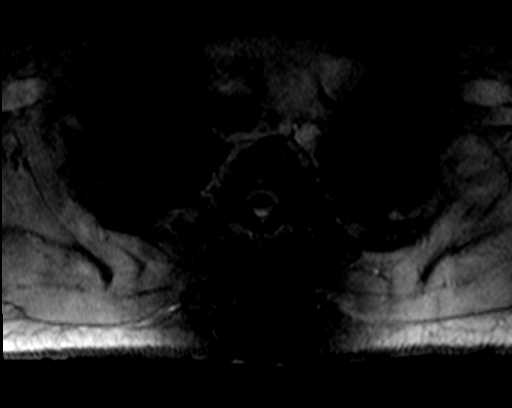

[22 of 48 positions shown; findings below may reference images not displayed]

FINDINGS: Alignment:  Trace anterolisthesis at T8-9.

Vertebrae: No fracture, evidence of discitis, or aggressive bone
lesion. Hemangioma in the T8 vertebral body.

Cord:  Normal signal and morphology.

Paraspinal and other soft tissues: Cystic appearing right renal
lesion measuring at least 5.5 cm, partially evaluated.

Disc levels:

Small posterior disc protrusions along the thoracic spine from T1 to
through T9-10 causing small indentations on the thecal sac without
significant spinal canal stenosis.

Mild facet degenerative changes at T8-9, T9-10 and T10-11.

No significant neural foraminal narrowing at any level.
IMPRESSION: Mild degenerative changes of the thoracic spine without high-grade
spinal canal or neural foraminal stenosis at any level.

## 2022-06-01 ENCOUNTER — Other Ambulatory Visit (HOSPITAL_COMMUNITY): Payer: Self-pay

## 2022-06-04 ENCOUNTER — Ambulatory Visit (INDEPENDENT_AMBULATORY_CARE_PROVIDER_SITE_OTHER): Payer: 59 | Admitting: Nurse Practitioner

## 2022-06-04 ENCOUNTER — Encounter (INDEPENDENT_AMBULATORY_CARE_PROVIDER_SITE_OTHER): Payer: Self-pay | Admitting: Nurse Practitioner

## 2022-06-04 VITALS — BP 130/80 | HR 74 | Temp 99.2°F | Ht 64.0 in | Wt 173.0 lb

## 2022-06-04 DIAGNOSIS — E669 Obesity, unspecified: Secondary | ICD-10-CM

## 2022-06-04 DIAGNOSIS — E7849 Other hyperlipidemia: Secondary | ICD-10-CM

## 2022-06-04 DIAGNOSIS — Z6829 Body mass index (BMI) 29.0-29.9, adult: Secondary | ICD-10-CM

## 2022-06-12 NOTE — Progress Notes (Unsigned)
Chief Complaint:   OBESITY Jillian Andrade is here to discuss her progress with her obesity treatment plan along with follow-up of her obesity related diagnoses. [name] is on keeping a food journal and adhering to recommended goals of 1150-1200 calories and 80 grams of protein and states she is following her eating plan approximately 95% of the time. [name] states she is exercising 0 minutes 0 times per week.  Today's visit was #: 8 Starting weight: 192 lbs Starting date: 12/22/2021 Today's weight: 173 lbs Today's date: 06/04/2022 Total lbs lost to date: 19 lbs Total lbs lost since last in-office visit: 0  Interim History: [name] has been on vacation since her last visit in office. On 04/16/22 she had a birthday party for her 59 year old aunt. She is following Cat 2 plan, some days does not get all her food in  due to her work schedule-1-2 times per week.  Eats bigger meal at lunch.  Denies hunger, occasional cravings for cantaloupe. Her goal weight is 155. She has been trying to stretch, and do floor exercises. Denies sodas.  Drinks water, protein shakes and seltzer water.   Subjective:   1. Other hyperlipidemia [name] is taking Crestor 20 mg. Denies any side effects.  Assessment/Plan:   1. Other hyperlipidemia Jillian Andrade will continue to follow up with PCP and continue medications as directed. Cardiovascular risk and specific lipid/LDL goals reviewed.  We discussed several lifestyle modifications today and [name] will continue to work on diet, exercise and weight loss efforts. Orders and follow up as documented in patient record.   Counseling Intensive lifestyle modifications are the first line treatment for this issue. Dietary changes: Increase soluble fiber. Decrease simple carbohydrates. Exercise changes: Moderate to vigorous-intensity aerobic activity 150 minutes per week if tolerated. Lipid-lowering medications: see documented in medical record.  2. Obesity with current BMI of  29.7 Jillian Andrade is currently in the action stage of change. As such, her goal is to continue with weight loss efforts. She has agreed to the Category 2 Plan.   Exercise goals: Jillian Andrade is to start chair exercises.  Behavioral modification strategies: increasing lean protein intake, increasing water intake, and planning for success.  Jillian Andrade has agreed to follow-up with our clinic in 6 weeks. She was informed of the importance of frequent follow-up visits to maximize her success with intensive lifestyle modifications for her multiple health conditions.   Objective:   Blood pressure 130/80, pulse 74, temperature 99.2 F (37.3 C), height '5\' 4"'$  (1.626 m), weight 173 lb (78.5 kg), SpO2 98 %. Body mass index is 29.7 kg/m.  General: Cooperative, alert, well developed, in no acute distress. HEENT: Conjunctivae and lids unremarkable. Cardiovascular: Regular rhythm.  Lungs: Normal work of breathing. Neurologic: No focal deficits.   Lab Results  Component Value Date   CREATININE 0.90 12/22/2021   BUN 11 12/22/2021   NA 140 12/22/2021   K 4.1 12/22/2021   CL 103 12/22/2021   CO2 21 12/22/2021   Lab Results  Component Value Date   ALT 23 12/22/2021   AST 33 12/22/2021   ALKPHOS 68 12/22/2021   BILITOT 0.5 12/22/2021   Lab Results  Component Value Date   HGBA1C 5.9 (H) 12/22/2021   HGBA1C 6.1 09/22/2021   HGBA1C 6.0 09/12/2020   HGBA1C 6.0 08/31/2019   HGBA1C 6.1 03/13/2019   Lab Results  Component Value Date   INSULIN 6.9 12/22/2021   Lab Results  Component Value Date   TSH 0.662 12/22/2021   Lab  Results  Component Value Date   CHOL 144 12/22/2021   HDL 64 12/22/2021   LDLCALC 68 12/22/2021   TRIG 54 12/22/2021   CHOLHDL 2 09/22/2021   Lab Results  Component Value Date   VD25OH 90.0 12/22/2021   VD25OH 110.26 ([facility]) 09/22/2021   VD25OH 99.60 09/12/2020   Lab Results  Component Value Date   WBC 6.3 12/22/2021   HGB 13.4 12/22/2021   HCT 40.9 12/22/2021   MCV 90  12/22/2021   PLT 142 (L) 12/22/2021   Lab Results  Component Value Date   IRON 56 03/13/2019   Attestation Statements:   Reviewed by clinician on day of visit: allergies, medications, problem list, medical history, surgical history, family history, social history, and previous encounter notes.  Time spent on visit including pre-visit chart review and post-visit care and charting was 30 minutes.   I, Brendell Tyus, RMA, am acting as transcriptionist for Everardo Pacific, FNP.  I have reviewed the above documentation for accuracy and completeness, and I agree with the above. Everardo Pacific, FNP

## 2022-06-22 ENCOUNTER — Other Ambulatory Visit (HOSPITAL_COMMUNITY): Payer: Self-pay

## 2022-06-25 ENCOUNTER — Other Ambulatory Visit (HOSPITAL_COMMUNITY): Payer: Self-pay

## 2022-06-25 MED ORDER — ESTRADIOL 1 MG PO TABS
1.0000 mg | ORAL_TABLET | Freq: Every day | ORAL | 0 refills | Status: DC
  Filled 2022-06-25: qty 90, 90d supply, fill #0

## 2022-07-02 ENCOUNTER — Telehealth (INDEPENDENT_AMBULATORY_CARE_PROVIDER_SITE_OTHER): Payer: 59 | Admitting: Family Medicine

## 2022-07-23 ENCOUNTER — Ambulatory Visit (INDEPENDENT_AMBULATORY_CARE_PROVIDER_SITE_OTHER): Payer: 59 | Admitting: Adult Health

## 2022-07-26 NOTE — Progress Notes (Signed)
TeleHealth Visit:  This visit was completed with telemedicine (audio/video) technology. [name] has verbally consented to this TeleHealth visit. The patient is located at home, the provider is located at home. The participants in this visit include the listed provider and patient. The visit was conducted today via MyChart video.  OBESITY Jillian Andrade is here to discuss her progress with her obesity treatment plan along with follow-up of her obesity related diagnoses.   Today's visit was # 9 Starting weight: 192 lbs Starting date: 12/22/2021 Weight at last in office visit: 173 lbs on 06/04/22 Total weight loss: 19 lbs at last in office visit on 06/04/22. Today's reported weight: 177 lbs  at OB/GYN  Goal weight 155 lbs  Nutrition Plan: the Category 2 Plan. - 85% adherence  Current exercise:10000 steps on work days.  Interim History: [name] reports she never has an issue getting in the prescribed protein.  On her days off work she has no trouble getting in all of the category 2 food.  On the days that she works (works 14 hour shifts 3 days/week ) she frequently does not get a break to eat.  She tries her best to get in food that she has packed.  She drinks adequate water and tends to do better with water intake on workdays.  Recently got a standing desk at work.  She rarely eats out.  She mostly cooks at home and packs food for work.  Denies excessive hunger or cravings.  Assessment/Plan:  1. GERD Well-controlled with pantoprazole 20 mg daily.  Plan: Continue pantoprazole 20 mg daily.  2. Cervicalgia Has cervicalgia with radiculopathy.  She will be having a neck fusion in December by Dr. Saintclair Halsted.  She does not have a surgery date yet.  She is status post C5-6 anterior cervical decompression/discectomy/fusion by Dr. Vertell Limber on 10/04/2016.   Pain is severe.  Plan: Follow-up with Dr. Saintclair Halsted as directed.  3. Obesity: Current BMI 29.7 [name] is currently in the action stage of change. As  such, her goal is to continue with weight loss efforts.  She has agreed to the Category 2 Plan.   Exercise goals: as is  Behavioral modification strategies: increasing lean protein intake, decreasing simple carbohydrates, and planning for success.  Veronica has agreed to follow-up with our clinic in 6 weeks.  She requests 6-week follow-ups.  No orders of the defined types were placed in this encounter.   There are no discontinued medications.   No orders of the defined types were placed in this encounter.     Objective:   VITALS: Per patient if applicable, see vitals. GENERAL: Alert and in no acute distress. CARDIOPULMONARY: No increased WOB. Speaking in clear sentences.  PSYCH: Pleasant and cooperative. Speech normal rate and rhythm. Affect is appropriate. Insight and judgement are appropriate. Attention is focused, linear, and appropriate.  NEURO: Oriented as arrived to appointment on time with no prompting.   Lab Results  Component Value Date   CREATININE 0.90 12/22/2021   BUN 11 12/22/2021   NA 140 12/22/2021   K 4.1 12/22/2021   CL 103 12/22/2021   CO2 21 12/22/2021   Lab Results  Component Value Date   ALT 23 12/22/2021   AST 33 12/22/2021   ALKPHOS 68 12/22/2021   BILITOT 0.5 12/22/2021   Lab Results  Component Value Date   HGBA1C 5.9 (H) 12/22/2021   HGBA1C 6.1 09/22/2021   HGBA1C 6.0 09/12/2020   HGBA1C 6.0 08/31/2019   HGBA1C 6.1 03/13/2019   Lab  Results  Component Value Date   INSULIN 6.9 12/22/2021   Lab Results  Component Value Date   TSH 0.662 12/22/2021   Lab Results  Component Value Date   CHOL 144 12/22/2021   HDL 64 12/22/2021   LDLCALC 68 12/22/2021   TRIG 54 12/22/2021   CHOLHDL 2 09/22/2021   Lab Results  Component Value Date   WBC 6.3 12/22/2021   HGB 13.4 12/22/2021   HCT 40.9 12/22/2021   MCV 90 12/22/2021   PLT 142 (L) 12/22/2021   Lab Results  Component Value Date   IRON 56 03/13/2019   Lab Results  Component Value  Date   VD25OH 90.0 12/22/2021   VD25OH 110.26 ([place]) 09/22/2021   VD25OH 99.60 09/12/2020    Attestation Statements:   Reviewed by clinician on day of visit: allergies, medications, problem list, medical history, surgical history, family history, social history, and previous encounter notes.

## 2022-07-27 ENCOUNTER — Other Ambulatory Visit (HOSPITAL_COMMUNITY): Payer: Self-pay

## 2022-07-27 DIAGNOSIS — Z01419 Encounter for gynecological examination (general) (routine) without abnormal findings: Secondary | ICD-10-CM | POA: Diagnosis not present

## 2022-07-27 DIAGNOSIS — Z6830 Body mass index (BMI) 30.0-30.9, adult: Secondary | ICD-10-CM | POA: Diagnosis not present

## 2022-07-27 DIAGNOSIS — N959 Unspecified menopausal and perimenopausal disorder: Secondary | ICD-10-CM | POA: Diagnosis not present

## 2022-07-27 DIAGNOSIS — Z1272 Encounter for screening for malignant neoplasm of vagina: Secondary | ICD-10-CM | POA: Diagnosis not present

## 2022-07-27 MED ORDER — ESTRADIOL 1 MG PO TABS
1.0000 mg | ORAL_TABLET | Freq: Every day | ORAL | 3 refills | Status: DC
  Filled 2022-07-27 – 2022-09-20 (×2): qty 90, 90d supply, fill #0
  Filled 2022-12-21: qty 90, 90d supply, fill #1
  Filled 2023-03-12: qty 90, 90d supply, fill #2
  Filled 2023-06-19: qty 90, 90d supply, fill #3

## 2022-07-30 ENCOUNTER — Encounter (INDEPENDENT_AMBULATORY_CARE_PROVIDER_SITE_OTHER): Payer: Self-pay | Admitting: Family Medicine

## 2022-07-30 ENCOUNTER — Telehealth (INDEPENDENT_AMBULATORY_CARE_PROVIDER_SITE_OTHER): Payer: 59 | Admitting: Family Medicine

## 2022-07-30 DIAGNOSIS — K219 Gastro-esophageal reflux disease without esophagitis: Secondary | ICD-10-CM

## 2022-07-30 DIAGNOSIS — E669 Obesity, unspecified: Secondary | ICD-10-CM

## 2022-07-30 DIAGNOSIS — Z6829 Body mass index (BMI) 29.0-29.9, adult: Secondary | ICD-10-CM

## 2022-07-30 DIAGNOSIS — M542 Cervicalgia: Secondary | ICD-10-CM

## 2022-08-13 ENCOUNTER — Other Ambulatory Visit (HOSPITAL_COMMUNITY): Payer: Self-pay

## 2022-08-14 ENCOUNTER — Other Ambulatory Visit (HOSPITAL_COMMUNITY): Payer: Self-pay

## 2022-08-16 ENCOUNTER — Other Ambulatory Visit (HOSPITAL_COMMUNITY): Payer: Self-pay

## 2022-08-16 MED ORDER — AMOXICILLIN 500 MG PO CAPS
2000.0000 mg | ORAL_CAPSULE | ORAL | 2 refills | Status: DC
  Filled 2022-08-16: qty 12, 3d supply, fill #0

## 2022-08-22 ENCOUNTER — Other Ambulatory Visit: Payer: Self-pay | Admitting: Neurosurgery

## 2022-08-30 ENCOUNTER — Other Ambulatory Visit (HOSPITAL_COMMUNITY): Payer: Self-pay

## 2022-09-10 ENCOUNTER — Ambulatory Visit (INDEPENDENT_AMBULATORY_CARE_PROVIDER_SITE_OTHER): Payer: 59 | Admitting: Family Medicine

## 2022-09-17 ENCOUNTER — Ambulatory Visit (INDEPENDENT_AMBULATORY_CARE_PROVIDER_SITE_OTHER): Payer: 59 | Admitting: Internal Medicine

## 2022-09-17 ENCOUNTER — Ambulatory Visit (INDEPENDENT_AMBULATORY_CARE_PROVIDER_SITE_OTHER): Payer: 59 | Admitting: Family Medicine

## 2022-09-20 ENCOUNTER — Other Ambulatory Visit (HOSPITAL_COMMUNITY): Payer: Self-pay

## 2022-09-20 ENCOUNTER — Other Ambulatory Visit: Payer: Self-pay

## 2022-09-21 NOTE — Progress Notes (Signed)
Surgical Instructions    Your procedure is scheduled on Thursday December 21st.  Report to [facility] Main Entrance "A" at 5:30 A.M., then check in with the Admitting office.  Call this number if you have problems the morning of surgery:  [phone]   If you have any questions prior to your surgery date call [phone]: Open Monday-Friday 8am-4pm If you experience any cold or flu symptoms such as cough, fever, chills, shortness of breath, etc. between now and your scheduled surgery, please notify us at the above number     Remember:  Do not eat or drink after midnight the night before your surgery     Take these medicines the morning of surgery with A SIP OF WATER: pantoprazole (PROTONIX) 20 MG tablet  rosuvastatin (CRESTOR) 20 MG tablet   IF NEEDED  cycloSPORINE (RESTASIS) 0.05 % ophthalmic emulsion      As of today, STOP taking any Aspirin (unless otherwise instructed by your surgeon) Aleve, Naproxen, Ibuprofen, Motrin, Advil, Goody's, BC's, all herbal medications, fish oil, and all vitamins.           Do not wear jewelry or makeup. Do not wear lotions, powders, perfumes or deodorant. Do not shave 48 hours prior to surgery.   Do not bring valuables to the hospital. Do not wear nail polish, gel polish, artificial nails, or any other type of covering on natural nails (fingers and toes) If you have artificial nails or gel coating that need to be removed by a nail salon, please have this removed prior to surgery. Artificial nails or gel coating may interfere with anesthesia's ability to adequately monitor your vital signs.  [place] is not responsible for any belongings or valuables.    Do NOT Smoke (Tobacco/Vaping)  24 hours prior to your procedure  If you use a CPAP at night, you may bring your mask for your overnight stay.   Contacts, glasses, hearing aids, dentures or partials may not be worn into surgery, please bring cases for these belongings   For patients  admitted to the hospital, discharge time will be determined by your treatment team.   Patients discharged the day of surgery will not be allowed to drive home, and someone needs to stay with them for 24 hours.   SURGICAL WAITING ROOM VISITATION Patients having surgery or a procedure may have no more than 2 support people in the waiting area - these visitors may rotate.   Children under the age of 64 must have an adult with them who is not the patient. If the patient needs to stay at the hospital during part of their recovery, the visitor guidelines for inpatient rooms apply. Pre-op nurse will coordinate an appropriate time for 1 support person to accompany patient in pre-op.  This support person may not rotate.   Please refer to [email or web address][employer]hu for the visitor guidelines for Inpatients (after your surgery is over and you are in a regular room).    Special instructions:    Oral Hygiene is also important to reduce your risk of infection.  Remember - BRUSH YOUR TEETH THE MORNING OF SURGERY WITH YOUR REGULAR TOOTHPASTE   [place]- Preparing For Surgery  Before surgery, you can play an important role. Because skin is not sterile, your skin needs to be as free of germs as possible. You can reduce the number of germs on your skin by washing with CHG (chlorahexidine gluconate) Soap before surgery.  CHG is an antiseptic cleaner which kills germs and bonds  with the skin to continue killing germs even after washing.     Please do not use if you have an allergy to CHG or antibacterial soaps. If your skin becomes reddened/irritated stop using the CHG.  Do not shave (including legs and underarms) for at least 48 hours prior to first CHG shower. It is OK to shave your face.  Please follow these instructions carefully.     Shower the NIGHT BEFORE SURGERY and the MORNING OF SURGERY with CHG Soap.   If you chose to wash your hair, wash your hair  first as usual with your normal shampoo. After you shampoo, rinse your hair and body thoroughly to remove the shampoo.  Then [employer] and genitals (private parts) with your normal soap and rinse thoroughly to remove soap.  After that Use CHG Soap as you would any other liquid soap. You can apply CHG directly to the skin and wash gently with a scrungie or a clean washcloth.   Apply the CHG Soap to your body ONLY FROM THE NECK DOWN.  Do not use on open wounds or open sores. Avoid contact with your eyes, ears, mouth and genitals (private parts). Wash Face and genitals (private parts)  with your normal soap.   Wash thoroughly, paying special attention to the area where your surgery will be performed.  Thoroughly rinse your body with warm water from the neck down.  DO NOT shower/wash with your normal soap after using and rinsing off the CHG Soap.  Pat yourself dry with a CLEAN TOWEL.  Wear CLEAN PAJAMAS to bed the night before surgery  Place CLEAN SHEETS on your bed the night before your surgery  DO NOT SLEEP WITH PETS.   Day of Surgery:  Take a shower with CHG soap. Wear Clean/Comfortable clothing the morning of surgery Do not apply any deodorants/lotions.   Remember to brush your teeth WITH YOUR REGULAR TOOTHPASTE.    If you received a COVID test during your pre-op visit, it is requested that you wear a mask when out in public, stay away from anyone that may not be feeling well, and notify your surgeon if you develop symptoms. If you have been in contact with anyone that has tested positive in the last 10 days, please notify your surgeon.    Please read over the following fact sheets that you were given.

## 2022-09-24 ENCOUNTER — Encounter (HOSPITAL_COMMUNITY): Payer: Self-pay

## 2022-09-24 ENCOUNTER — Other Ambulatory Visit: Payer: Self-pay

## 2022-09-24 ENCOUNTER — Encounter (HOSPITAL_COMMUNITY)
Admission: RE | Admit: 2022-09-24 | Discharge: 2022-09-24 | Disposition: A | Discharge status: Home / Self Care | Payer: 59 | Source: Ambulatory Visit | Attending: Neurosurgery | Admitting: Neurosurgery

## 2022-09-24 VITALS — BP 90/71 | HR 66 | Temp 98.3°F | Resp 18 | Ht 64.0 in | Wt 179.9 lb

## 2022-09-24 DIAGNOSIS — Z01812 Encounter for preprocedural laboratory examination: Secondary | ICD-10-CM | POA: Diagnosis not present

## 2022-09-24 DIAGNOSIS — R001 Bradycardia, unspecified: Secondary | ICD-10-CM | POA: Insufficient documentation

## 2022-09-24 DIAGNOSIS — Z01818 Encounter for other preprocedural examination: Secondary | ICD-10-CM

## 2022-09-24 LAB — TYPE AND SCREEN
ABO/RH(D): O POS
Antibody Screen: NEGATIVE

## 2022-09-24 LAB — BASIC METABOLIC PANEL
Anion gap: 6 (ref 5–15)
BUN: 11 mg/dL (ref 6–20)
CO2: 25 mmol/L (ref 22–32)
Calcium: 9.4 mg/dL (ref 8.9–10.3)
Chloride: 109 mmol/L (ref 98–111)
Creatinine, Ser: 0.88 mg/dL (ref 0.44–1.00)
GFR, Estimated: >60 mL/min (ref >60)
Glucose, Bld: 98 mg/dL (ref 70–99)
Potassium: 4.7 mmol/L (ref 3.5–5.1)
Sodium: 140 mmol/L (ref 135–145)

## 2022-09-24 LAB — CBC
HCT: 41.4 % (ref 36.0–46.0)
Hemoglobin: 13.5 g/dL (ref 12.0–15.0)
MCH: 30.3 pg (ref 26.0–34.0)
MCHC: 32.6 g/dL (ref 30.0–36.0)
MCV: 92.8 fL (ref 80.0–100.0)
Platelets: 137 10*3/uL — ABNORMAL LOW (ref 150–400)
RBC: 4.46 MIL/uL (ref 3.87–5.11)
RDW: 12.4 % (ref 11.5–15.5)
WBC: 5.9 10*3/uL (ref 4.0–10.5)
nRBC: 0 % (ref 0.0–0.2)

## 2022-09-24 LAB — SURGICAL PCR SCREEN
MRSA, PCR: NEGATIVE
Staphylococcus aureus: NEGATIVE

## 2022-09-24 NOTE — Progress Notes (Signed)
PCP - Dr. Cathlean Cower Cardiologist - denies  PPM/ICD - denies   Chest x-ray - 09/01/21 EKG - 09/01/21 Stress Test - 04/27/15 ECHO - denies Cardiac Cath - denies  Sleep Study - denies   DM- denies  Last dose of GLP1 agonist-  n/a   ASA/Blood Thinner Instructions: n/a   ERAS Protcol - no, NPO   COVID TEST- n/a   Anesthesia review: no. James listened to heart sounds at PAT appt- no murmur auscultated  Patient denies shortness of breath, fever, cough and chest pain at PAT appointment   All instructions explained to the patient, with a verbal understanding of the material. Patient agrees to go over the instructions while at home for a better understanding.  The opportunity to ask questions was provided.

## 2022-09-27 ENCOUNTER — Encounter (HOSPITAL_COMMUNITY): Payer: Self-pay | Admitting: Neurosurgery

## 2022-09-27 NOTE — Anesthesia Preprocedure Evaluation (Addendum)
Anesthesia Evaluation  Patient identified by MRN, date of birth, ID band Patient awake    Reviewed: Allergy & Precautions, NPO status , Patient's Chart, lab work & pertinent test results  History of Anesthesia Complications (+) PONV and history of anesthetic complications  Airway Mallampati: II  TM Distance: >3 FB Neck ROM: Limited    Dental  (+) Teeth Intact, Dental Advisory Given   Pulmonary    breath sounds clear to auscultation       Cardiovascular + Valvular Problems/Murmurs MVP  Rhythm:Regular Rate:Normal     Neuro/Psych Seizures -, Well Controlled,   negative psych ROS   GI/Hepatic Neg liver ROS,GERD  Medicated,,  Endo/Other  negative endocrine ROS    Renal/GU Renal disease     Musculoskeletal  (+) Arthritis ,    Abdominal   Peds  Hematology negative hematology ROS (+)   Anesthesia Other Findings   Reproductive/Obstetrics                             Anesthesia Physical Anesthesia Plan  ASA: 3  Anesthesia Plan: General   Post-op Pain Management: Dilaudid IV and Ofirmev IV (intra-op)*   Induction: Intravenous  PONV Risk Score and Plan: 4 or greater and Ondansetron, Dexamethasone, Midazolam, Scopolamine patch - Pre-op, TIVA, Propofol infusion and Amisulpride  Airway Management Planned: Oral ETT and Video Laryngoscope Planned  Additional Equipment: None  Intra-op Plan:   Post-operative Plan: Extubation in OR  Informed Consent: I have reviewed the patients History and Physical, chart, labs and discussed the procedure including the risks, benefits and alternatives for the proposed anesthesia with the patient or authorized representative who has indicated his/her understanding and acceptance.     Dental advisory given  Plan Discussed with: CRNA  Anesthesia Plan Comments:        Anesthesia Quick Evaluation

## 2022-10-01 DIAGNOSIS — M5412 Radiculopathy, cervical region: Secondary | ICD-10-CM | POA: Diagnosis not present

## 2022-10-04 ENCOUNTER — Other Ambulatory Visit: Payer: Self-pay

## 2022-10-04 ENCOUNTER — Ambulatory Visit (HOSPITAL_COMMUNITY)
Admission: RE | Admit: 2022-10-04 | Discharge: 2022-10-05 | Disposition: A | Discharge status: Home / Self Care | Payer: 59 | Attending: Neurosurgery | Admitting: Neurosurgery

## 2022-10-04 ENCOUNTER — Ambulatory Visit (HOSPITAL_BASED_OUTPATIENT_CLINIC_OR_DEPARTMENT_OTHER): Payer: 59 | Admitting: Anesthesiology

## 2022-10-04 ENCOUNTER — Ambulatory Visit (HOSPITAL_COMMUNITY): Payer: 59

## 2022-10-04 ENCOUNTER — Ambulatory Visit (HOSPITAL_COMMUNITY): Payer: 59 | Admitting: Physician Assistant

## 2022-10-04 ENCOUNTER — Encounter (HOSPITAL_COMMUNITY): Payer: Self-pay | Admitting: Neurosurgery

## 2022-10-04 ENCOUNTER — Ambulatory Visit (HOSPITAL_COMMUNITY)
Admission: RE | Disposition: A | Discharge status: Home / Self Care | Payer: Self-pay | Source: Home / Self Care | Attending: Neurosurgery

## 2022-10-04 DIAGNOSIS — G40909 Epilepsy, unspecified, not intractable, without status epilepticus: Secondary | ICD-10-CM | POA: Diagnosis not present

## 2022-10-04 DIAGNOSIS — M4322 Fusion of spine, cervical region: Secondary | ICD-10-CM | POA: Diagnosis not present

## 2022-10-04 DIAGNOSIS — M4802 Spinal stenosis, cervical region: Secondary | ICD-10-CM

## 2022-10-04 DIAGNOSIS — K219 Gastro-esophageal reflux disease without esophagitis: Secondary | ICD-10-CM | POA: Insufficient documentation

## 2022-10-04 DIAGNOSIS — Z981 Arthrodesis status: Secondary | ICD-10-CM | POA: Diagnosis not present

## 2022-10-04 DIAGNOSIS — Z79899 Other long term (current) drug therapy: Secondary | ICD-10-CM | POA: Insufficient documentation

## 2022-10-04 DIAGNOSIS — M4722 Other spondylosis with radiculopathy, cervical region: Secondary | ICD-10-CM

## 2022-10-04 DIAGNOSIS — I341 Nonrheumatic mitral (valve) prolapse: Secondary | ICD-10-CM | POA: Insufficient documentation

## 2022-10-04 DIAGNOSIS — N289 Disorder of kidney and ureter, unspecified: Secondary | ICD-10-CM | POA: Diagnosis not present

## 2022-10-04 DIAGNOSIS — M1990 Unspecified osteoarthritis, unspecified site: Secondary | ICD-10-CM | POA: Diagnosis not present

## 2022-10-04 DIAGNOSIS — M47812 Spondylosis without myelopathy or radiculopathy, cervical region: Secondary | ICD-10-CM | POA: Diagnosis not present

## 2022-10-04 HISTORY — PX: ANTERIOR CERVICAL DECOMP/DISCECTOMY FUSION: SHX1161

## 2022-10-04 SURGERY — ANTERIOR CERVICAL DECOMPRESSION/DISCECTOMY FUSION 2 LEVEL/HARDWARE REMOVAL
Anesthesia: General

## 2022-10-04 MED ORDER — PHENYLEPHRINE HCL-NACL 20-0.9 MG/250ML-% IV SOLN
INTRAVENOUS | Status: DC | PRN
  Administered 2022-10-04: 40 ug/min via INTRAVENOUS

## 2022-10-04 MED ORDER — SODIUM CHLORIDE 0.9% FLUSH
3.0000 mL | Freq: Two times a day (BID) | INTRAVENOUS | Status: DC
  Administered 2022-10-04: 3 mL via INTRAVENOUS

## 2022-10-04 MED ORDER — ACETAMINOPHEN 10 MG/ML IV SOLN: 1000.0000 mg | Freq: Once | INTRAVENOUS | Status: DC | PRN

## 2022-10-04 MED ORDER — PANTOPRAZOLE SODIUM 40 MG IV SOLR: 40.0000 mg | Freq: Every day | INTRAVENOUS | Status: DC

## 2022-10-04 MED ORDER — SUGAMMADEX SODIUM 200 MG/2ML IV SOLN
INTRAVENOUS | Status: DC | PRN
  Administered 2022-10-04: 200 mg via INTRAVENOUS

## 2022-10-04 MED ORDER — PROPOFOL 10 MG/ML IV BOLUS
INTRAVENOUS | Status: AC
  Filled 2022-10-04: qty 20

## 2022-10-04 MED ORDER — PROPOFOL 1000 MG/100ML IV EMUL
INTRAVENOUS | Status: AC
  Filled 2022-10-04: qty 200

## 2022-10-04 MED ORDER — PHENYLEPHRINE 80 MCG/ML (10ML) SYRINGE FOR IV PUSH (FOR BLOOD PRESSURE SUPPORT)
PREFILLED_SYRINGE | INTRAVENOUS | Status: DC | PRN
  Administered 2022-10-04: 160 ug via INTRAVENOUS

## 2022-10-04 MED ORDER — PANTOPRAZOLE SODIUM 20 MG PO TBEC: 20.0000 mg | DELAYED_RELEASE_TABLET | Freq: Every morning | ORAL | Status: DC

## 2022-10-04 MED ORDER — THROMBIN (RECOMBINANT) 5000 UNITS EX SOLR
CUTANEOUS | Status: DC | PRN
  Administered 2022-10-04: 10 mL via TOPICAL

## 2022-10-04 MED ORDER — HYDRALAZINE HCL 20 MG/ML IJ SOLN
INTRAMUSCULAR | Status: AC
  Filled 2022-10-04: qty 1

## 2022-10-04 MED ORDER — ACETAMINOPHEN 325 MG PO TABS: 325.0000 mg | ORAL_TABLET | Freq: Once | ORAL | Status: DC | PRN

## 2022-10-04 MED ORDER — SODIUM CHLORIDE 0.9 % IV SOLN
250.0000 mL | INTRAVENOUS | Status: DC
  Administered 2022-10-04: 250 mL via INTRAVENOUS

## 2022-10-04 MED ORDER — LACTATED RINGERS IV SOLN: INTRAVENOUS | Status: DC

## 2022-10-04 MED ORDER — ALUM & MAG HYDROXIDE-SIMETH 200-200-20 MG/5ML PO SUSP: 30.0000 mL | Freq: Four times a day (QID) | ORAL | Status: DC | PRN

## 2022-10-04 MED ORDER — CYCLOBENZAPRINE HCL 10 MG PO TABS
10.0000 mg | ORAL_TABLET | Freq: Three times a day (TID) | ORAL | Status: DC | PRN
  Administered 2022-10-04: 10 mg via ORAL
  Filled 2022-10-04: qty 1

## 2022-10-04 MED ORDER — ACETAMINOPHEN 325 MG PO TABS: 650.0000 mg | ORAL_TABLET | ORAL | Status: DC | PRN

## 2022-10-04 MED ORDER — RISAQUAD PO CAPS
1.0000 | ORAL_CAPSULE | Freq: Every day | ORAL | Status: DC
  Administered 2022-10-05: 1 via ORAL
  Filled 2022-10-04: qty 1

## 2022-10-04 MED ORDER — HYDROMORPHONE HCL 1 MG/ML IJ SOLN: 0.5000 mg | INTRAMUSCULAR | Status: DC | PRN

## 2022-10-04 MED ORDER — SODIUM CHLORIDE 0.9% FLUSH: 3.0000 mL | INTRAVENOUS | Status: DC | PRN

## 2022-10-04 MED ORDER — AMISULPRIDE (ANTIEMETIC) 5 MG/2ML IV SOLN: 10.0000 mg | Freq: Once | INTRAVENOUS | Status: DC | PRN

## 2022-10-04 MED ORDER — ORAL CARE MOUTH RINSE: 15.0000 mL | Freq: Once | OROMUCOSAL | Status: AC

## 2022-10-04 MED ORDER — DEXAMETHASONE SODIUM PHOSPHATE 10 MG/ML IJ SOLN
8.0000 mg | Freq: Four times a day (QID) | INTRAMUSCULAR | Status: AC
  Administered 2022-10-04 – 2022-10-05 (×3): 8 mg via INTRAVENOUS
  Filled 2022-10-04 (×3): qty 1

## 2022-10-04 MED ORDER — CHLORHEXIDINE GLUCONATE CLOTH 2 % EX PADS: 6.0000 | MEDICATED_PAD | Freq: Once | CUTANEOUS | Status: DC

## 2022-10-04 MED ORDER — ROSUVASTATIN CALCIUM 20 MG PO TABS
20.0000 mg | ORAL_TABLET | Freq: Every day | ORAL | Status: DC
  Administered 2022-10-04: 20 mg via ORAL
  Filled 2022-10-04: qty 1

## 2022-10-04 MED ORDER — ESTRADIOL 0.5 MG PO TABS
1.0000 mg | ORAL_TABLET | Freq: Every day | ORAL | Status: DC
  Administered 2022-10-04: 1 mg via ORAL
  Filled 2022-10-04 (×2): qty 2

## 2022-10-04 MED ORDER — THROMBIN 5000 UNITS EX SOLR
OROMUCOSAL | Status: DC | PRN
  Administered 2022-10-04: 5 mL via TOPICAL

## 2022-10-04 MED ORDER — LIDOCAINE 2% (20 MG/ML) 5 ML SYRINGE
INTRAMUSCULAR | Status: AC
  Filled 2022-10-04: qty 5

## 2022-10-04 MED ORDER — PHENYLEPHRINE 80 MCG/ML (10ML) SYRINGE FOR IV PUSH (FOR BLOOD PRESSURE SUPPORT)
PREFILLED_SYRINGE | INTRAVENOUS | Status: AC
  Filled 2022-10-04: qty 10

## 2022-10-04 MED ORDER — ONDANSETRON HCL 4 MG/2ML IJ SOLN
INTRAMUSCULAR | Status: DC | PRN
  Administered 2022-10-04: 4 mg via INTRAVENOUS

## 2022-10-04 MED ORDER — ESTRADIOL 0.5 MG PO TABS: 1.0000 mg | ORAL_TABLET | Freq: Every day | ORAL | Status: DC

## 2022-10-04 MED ORDER — CEFAZOLIN SODIUM-DEXTROSE 2-4 GM/100ML-% IV SOLN
INTRAVENOUS | Status: AC
  Filled 2022-10-04: qty 100

## 2022-10-04 MED ORDER — SCOPOLAMINE 1 MG/3DAYS TD PT72
1.0000 | MEDICATED_PATCH | TRANSDERMAL | Status: DC
  Filled 2022-10-04: qty 1

## 2022-10-04 MED ORDER — SCOPOLAMINE 1 MG/3DAYS TD PT72
MEDICATED_PATCH | TRANSDERMAL | Status: AC
  Administered 2022-10-04: 1.5 mg via TRANSDERMAL
  Filled 2022-10-04: qty 1

## 2022-10-04 MED ORDER — PROPOFOL 500 MG/50ML IV EMUL
INTRAVENOUS | Status: DC | PRN
  Administered 2022-10-04: 50 ug/kg/min via INTRAVENOUS

## 2022-10-04 MED ORDER — CHLORHEXIDINE GLUCONATE 0.12 % MT SOLN: 15.0000 mL | Freq: Once | OROMUCOSAL | Status: AC

## 2022-10-04 MED ORDER — HYDRALAZINE HCL 20 MG/ML IJ SOLN
5.0000 mg | Freq: Once | INTRAMUSCULAR | Status: AC
  Administered 2022-10-04: 5 mg via INTRAVENOUS

## 2022-10-04 MED ORDER — PROMETHAZINE HCL 25 MG/ML IJ SOLN: 6.2500 mg | INTRAMUSCULAR | Status: DC | PRN

## 2022-10-04 MED ORDER — ROCURONIUM BROMIDE 10 MG/ML (PF) SYRINGE
PREFILLED_SYRINGE | INTRAVENOUS | Status: DC | PRN
  Administered 2022-10-04: 65 mg via INTRAVENOUS
  Administered 2022-10-04: 35 mg via INTRAVENOUS

## 2022-10-04 MED ORDER — PROPOFOL 10 MG/ML IV BOLUS
INTRAVENOUS | Status: DC | PRN
  Administered 2022-10-04: 150 mg via INTRAVENOUS

## 2022-10-04 MED ORDER — MIDAZOLAM HCL 2 MG/2ML IJ SOLN
INTRAMUSCULAR | Status: AC
  Filled 2022-10-04: qty 2

## 2022-10-04 MED ORDER — ACETAMINOPHEN 650 MG RE SUPP: 650.0000 mg | RECTAL | Status: DC | PRN

## 2022-10-04 MED ORDER — HYDROCODONE-ACETAMINOPHEN 5-325 MG PO TABS
2.0000 | ORAL_TABLET | ORAL | Status: DC | PRN
  Administered 2022-10-04: 2 via ORAL
  Filled 2022-10-04: qty 2

## 2022-10-04 MED ORDER — ONDANSETRON HCL 4 MG/2ML IJ SOLN
INTRAMUSCULAR | Status: AC
  Filled 2022-10-04: qty 2

## 2022-10-04 MED ORDER — HYDROMORPHONE HCL 1 MG/ML IJ SOLN: 0.2500 mg | INTRAMUSCULAR | Status: DC | PRN

## 2022-10-04 MED ORDER — 0.9 % SODIUM CHLORIDE (POUR BTL) OPTIME
TOPICAL | Status: DC | PRN
  Administered 2022-10-04: 1000 mL

## 2022-10-04 MED ORDER — ROCURONIUM BROMIDE 10 MG/ML (PF) SYRINGE
PREFILLED_SYRINGE | INTRAVENOUS | Status: AC
  Filled 2022-10-04: qty 10

## 2022-10-04 MED ORDER — MEPERIDINE HCL 25 MG/ML IJ SOLN: 6.2500 mg | INTRAMUSCULAR | Status: DC | PRN

## 2022-10-04 MED ORDER — CEFAZOLIN SODIUM-DEXTROSE 2-4 GM/100ML-% IV SOLN
2.0000 g | INTRAVENOUS | Status: AC
  Administered 2022-10-04: 2 g via INTRAVENOUS

## 2022-10-04 MED ORDER — SCOPOLAMINE 1 MG/3DAYS TD PT72: 1.0000 | MEDICATED_PATCH | TRANSDERMAL | Status: DC

## 2022-10-04 MED ORDER — PANTOPRAZOLE SODIUM 40 MG PO TBEC
40.0000 mg | DELAYED_RELEASE_TABLET | Freq: Every day | ORAL | Status: DC
  Administered 2022-10-04: 40 mg via ORAL
  Filled 2022-10-04: qty 1

## 2022-10-04 MED ORDER — ONDANSETRON HCL 4 MG PO TABS: 4.0000 mg | ORAL_TABLET | Freq: Four times a day (QID) | ORAL | Status: DC | PRN

## 2022-10-04 MED ORDER — ADULT MULTIVITAMIN W/MINERALS CH
1.0000 | ORAL_TABLET | ORAL | Status: DC
  Administered 2022-10-05: 1 via ORAL
  Filled 2022-10-04: qty 1

## 2022-10-04 MED ORDER — MENTHOL 3 MG MT LOZG: 1.0000 | LOZENGE | OROMUCOSAL | Status: DC | PRN

## 2022-10-04 MED ORDER — VITAMIN C 500 MG PO TABS
1000.0000 mg | ORAL_TABLET | Freq: Every day | ORAL | Status: DC
  Administered 2022-10-05: 1000 mg via ORAL
  Filled 2022-10-04: qty 2

## 2022-10-04 MED ORDER — CEFAZOLIN SODIUM-DEXTROSE 2-4 GM/100ML-% IV SOLN
2.0000 g | Freq: Three times a day (TID) | INTRAVENOUS | Status: AC
  Administered 2022-10-04 (×2): 2 g via INTRAVENOUS
  Filled 2022-10-04 (×2): qty 100

## 2022-10-04 MED ORDER — DEXAMETHASONE SODIUM PHOSPHATE 10 MG/ML IJ SOLN
INTRAMUSCULAR | Status: DC | PRN
  Administered 2022-10-04: 10 mg via INTRAVENOUS

## 2022-10-04 MED ORDER — VITAMIN D 25 MCG (1000 UNIT) PO TABS
2000.0000 [IU] | ORAL_TABLET | Freq: Every day | ORAL | Status: DC
  Administered 2022-10-05: 2000 [IU] via ORAL
  Filled 2022-10-04: qty 2

## 2022-10-04 MED ORDER — CHLORHEXIDINE GLUCONATE 0.12 % MT SOLN
OROMUCOSAL | Status: AC
  Administered 2022-10-04: 15 mL via OROMUCOSAL
  Filled 2022-10-04: qty 15

## 2022-10-04 MED ORDER — ACETAMINOPHEN 10 MG/ML IV SOLN
1000.0000 mg | Freq: Once | INTRAVENOUS | Status: AC
  Administered 2022-10-04: 1000 mg via INTRAVENOUS
  Filled 2022-10-04: qty 100

## 2022-10-04 MED ORDER — DEXAMETHASONE SODIUM PHOSPHATE 10 MG/ML IJ SOLN
INTRAMUSCULAR | Status: AC
  Filled 2022-10-04: qty 1

## 2022-10-04 MED ORDER — FENTANYL CITRATE (PF) 250 MCG/5ML IJ SOLN
INTRAMUSCULAR | Status: DC | PRN
  Administered 2022-10-04 (×5): 50 ug via INTRAVENOUS

## 2022-10-04 MED ORDER — CYCLOSPORINE 0.05 % OP EMUL: 1.0000 [drp] | Freq: Every day | OPHTHALMIC | Status: DC | PRN

## 2022-10-04 MED ORDER — PHENOL 1.4 % MT LIQD: 1.0000 | OROMUCOSAL | Status: DC | PRN

## 2022-10-04 MED ORDER — ACETAMINOPHEN 160 MG/5ML PO SOLN: 325.0000 mg | Freq: Once | ORAL | Status: DC | PRN

## 2022-10-04 MED ORDER — LIDOCAINE 2% (20 MG/ML) 5 ML SYRINGE
INTRAMUSCULAR | Status: DC | PRN
  Administered 2022-10-04: 40 mg via INTRAVENOUS

## 2022-10-04 MED ORDER — LACTATED RINGERS IV SOLN: INTRAVENOUS | Status: DC | PRN

## 2022-10-04 MED ORDER — THROMBIN 5000 UNITS EX SOLR
CUTANEOUS | Status: AC
  Filled 2022-10-04: qty 15000

## 2022-10-04 MED ORDER — FENTANYL CITRATE (PF) 250 MCG/5ML IJ SOLN
INTRAMUSCULAR | Status: AC
  Filled 2022-10-04: qty 5

## 2022-10-04 MED ORDER — ACETAMINOPHEN 10 MG/ML IV SOLN
INTRAVENOUS | Status: AC
  Filled 2022-10-04: qty 100

## 2022-10-04 MED ORDER — MIDAZOLAM HCL 5 MG/5ML IJ SOLN
INTRAMUSCULAR | Status: DC | PRN
  Administered 2022-10-04: 2 mg via INTRAVENOUS

## 2022-10-04 MED ORDER — ONDANSETRON HCL 4 MG/2ML IJ SOLN
4.0000 mg | Freq: Four times a day (QID) | INTRAMUSCULAR | Status: DC | PRN
  Administered 2022-10-04: 4 mg via INTRAVENOUS
  Filled 2022-10-04: qty 2

## 2022-10-04 SURGICAL SUPPLY — 61 items
BAG COUNTER SPONGE SURGICOUNT (BAG) ×1 IMPLANT
BAND RUBBER #18 3X1/16 STRL (MISCELLANEOUS) ×2 IMPLANT
BASKET BONE COLLECTION (BASKET) ×1 IMPLANT
BENZOIN TINCTURE PRP APPL 2/3 (GAUZE/BANDAGES/DRESSINGS) ×1 IMPLANT
BIT DRILL NEURO 2X3.1 SFT TUCH (MISCELLANEOUS) ×1 IMPLANT
BONE VIVIGEN FORMABLE 1.3CC (Bone Implant) ×1 IMPLANT
BUR MATCHSTICK NEURO 3.0 LAGG (BURR) ×1 IMPLANT
CANISTER SUCT 3000ML PPV (MISCELLANEOUS) ×1 IMPLANT
DERMABOND ADVANCED .7 DNX12 (GAUZE/BANDAGES/DRESSINGS) IMPLANT
DRAPE C-ARM 42X72 X-RAY (DRAPES) ×2 IMPLANT
DRAPE LAPAROTOMY 100X72 PEDS (DRAPES) ×1 IMPLANT
DRAPE MICROSCOPE SLANT 54X150 (MISCELLANEOUS) ×1 IMPLANT
DRILL NEURO 2X3.1 SOFT TOUCH (MISCELLANEOUS) ×1
DRSG OPSITE POSTOP 4X6 (GAUZE/BANDAGES/DRESSINGS) IMPLANT
DURAPREP 6ML APPLICATOR 50/CS (WOUND CARE) ×1 IMPLANT
ELECT COATED BLADE 2.86 ST (ELECTRODE) ×1 IMPLANT
ELECT REM PT RETURN 9FT ADLT (ELECTROSURGICAL) ×1
ELECTRODE REM PT RTRN 9FT ADLT (ELECTROSURGICAL) ×1 IMPLANT
GAUZE 4X4 16PLY ~~LOC~~+RFID DBL (SPONGE) IMPLANT
GAUZE SPONGE 4X4 12PLY STRL (GAUZE/BANDAGES/DRESSINGS) ×1 IMPLANT
GLOVE BIO SURGEON STRL SZ7 (GLOVE) IMPLANT
GLOVE BIO SURGEON STRL SZ8 (GLOVE) ×1 IMPLANT
GLOVE BIOGEL PI IND STRL 7.0 (GLOVE) IMPLANT
GLOVE EXAM NITRILE XL STR (GLOVE) IMPLANT
GLOVE INDICATOR 8.5 STRL (GLOVE) ×2 IMPLANT
GOWN STRL REUS W/ TWL LRG LVL3 (GOWN DISPOSABLE) ×1 IMPLANT
GOWN STRL REUS W/ TWL XL LVL3 (GOWN DISPOSABLE) ×1 IMPLANT
GOWN STRL REUS W/TWL 2XL LVL3 (GOWN DISPOSABLE) ×1 IMPLANT
GOWN STRL REUS W/TWL LRG LVL3 (GOWN DISPOSABLE) ×1
GOWN STRL REUS W/TWL XL LVL3 (GOWN DISPOSABLE) ×1
GRAFT BNE MATRIX VG FRMBL SM 1 (Bone Implant) IMPLANT
HALTER HD/CHIN CERV TRACTION D (MISCELLANEOUS) ×1 IMPLANT
HEMOSTAT POWDER KIT SURGIFOAM (HEMOSTASIS) ×1 IMPLANT
KIT BASIN OR (CUSTOM PROCEDURE TRAY) ×1 IMPLANT
KIT TURNOVER KIT B (KITS) ×1 IMPLANT
NDL HYPO 18GX1.5 BLUNT FILL (NEEDLE) ×1 IMPLANT
NDL SPNL 20GX3.5 QUINCKE YW (NEEDLE) ×1 IMPLANT
NEEDLE HYPO 18GX1.5 BLUNT FILL (NEEDLE) ×1 IMPLANT
NEEDLE SPNL 20GX3.5 QUINCKE YW (NEEDLE) ×1 IMPLANT
NS IRRIG 1000ML POUR BTL (IV SOLUTION) ×1 IMPLANT
PACK LAMINECTOMY NEURO (CUSTOM PROCEDURE TRAY) ×1 IMPLANT
PAD ARMBOARD 7.5X6 YLW CONV (MISCELLANEOUS) ×3 IMPLANT
PIN DISTRACTION 14MM (PIN) IMPLANT
PLATE CERV RES 32 2LVL (Plate) IMPLANT
SCREW VA SD 4.6X12 (Screw) IMPLANT
SCREW VA SD 4.6X14 (Screw) IMPLANT
SCREW VA SD RESONATE 4.2X14 (Screw) IMPLANT
SCREW VA ST 4.6X12 (Screw) IMPLANT
SCREW VA ST 4.6X14 (Screw) IMPLANT
SPACER HEDRON 14X16X6 0D (Spacer) IMPLANT
SPACER HEDRON C 12X14X7 0D (Spacer) IMPLANT
SPIKE FLUID TRANSFER (MISCELLANEOUS) ×1 IMPLANT
SPONGE INTESTINAL PEANUT (DISPOSABLE) ×1 IMPLANT
SPONGE SURGIFOAM ABS GEL SZ50 (HEMOSTASIS) ×1 IMPLANT
STRIP CLOSURE SKIN 1/2X4 (GAUZE/BANDAGES/DRESSINGS) ×1 IMPLANT
SUT VIC AB 3-0 SH 8-18 (SUTURE) ×1 IMPLANT
SUT VIC AB 4-0 PS2 27 (SUTURE) ×1 IMPLANT
TAPE CLOTH 4X10 WHT NS (GAUZE/BANDAGES/DRESSINGS) ×1 IMPLANT
TOWEL GREEN STERILE (TOWEL DISPOSABLE) ×1 IMPLANT
TOWEL GREEN STERILE FF (TOWEL DISPOSABLE) ×1 IMPLANT
WATER STERILE IRR 1000ML POUR (IV SOLUTION) ×1 IMPLANT

## 2022-10-04 NOTE — H&P (Signed)
Jillian Andrade is an 61 y.o. female.   Chief Complaint: Neck left shoulder and arm pain HPI: 61 year old female with longstanding history of neck pain previously undergone ACDF at C5-6 and has had progressive worsening neck and left shoulder pain radiating down to her elbow left greater than right.  Workup revealed progressive segmental degeneration above her previous fusion at C3-4 and C4-5.  Due to the patient's progression of clinical syndrome imaging findings of a conservative treatment recommended anterior cervical discectomy and fusion at C3-4 and C4-5.  I have extensively gone over the risks and benefits of the operation with the patient as well as perioperative course expectations of outcome and alternatives of surgery and she understands and agrees to proceed forward.  Past Medical History:  Diagnosis Date   Allergic rhinitis 03/06/2019   Anemia    Bilateral swelling of feet    Chest pain    Complication of anesthesia    n/v   Constipation    Epilepsy (HCC)    Fibroids in ovaries    pain in left side   GERD (gastroesophageal reflux disease)    no medications at this time   Hand osteomyelitis ([place]) 03/06/2019   Heart murmur    back in the 90s with the mitral valve prolapse. James, APP, listened to heart sounds 09/24/22- no murmur auscultated   High cholesterol    Insomnia 05/01/2016   Lactose intolerance    Lumbar disc disease 05/01/2016   Multiple food allergies    MVP (mitral valve prolapse) mild, no cardiologist   No pertinent past medical history    PONV (postoperative nausea and vomiting)    Renal cyst, right 2022   SOB (shortness of breath)    Swallowing difficulty    Vitamin D deficiency     Past Surgical History:  Procedure Laterality Date   ABDOMINAL HYSTERECTOMY     ANKLE ARTHROSCOPY  10/31/2011   Procedure: ANKLE ARTHROSCOPY;  Surgeon: Colin Rhein, MD;  Location: [place];  Service: Orthopedics;  Laterality: Right;  RIGHT ANKLE  ARTHROSCOPY WITH EXTENSIVE DEBRIDEMENT   ANTERIOR CERVICAL DECOMP/DISCECTOMY FUSION N/A 10/04/2016   Procedure: CERVICAL FIVE-CERVICAL SIX  ANTERIOR CERVICAL DECOMPRESSION/DISCECTOMY/FUSION;  Surgeon: Erline Levine, MD;  Location: [place];  Service: Neurosurgery;  Laterality: N/A;  C5-6 ANTERIOR CERVICAL DECOMPRESSION/DISCECTOMY/FUSION   BIOPSY  11/23/2021   Procedure: BIOPSY;  Surgeon: Wilford Corner, MD;  Location: [facility];  Service: Endoscopy;;   COLONOSCOPY     COLONOSCOPY N/A 03/02/2014   Procedure: COLONOSCOPY;  Surgeon: Cleotis Nipper, MD;  Location: [facility];  Service: Endoscopy;  Laterality: N/A;  ultra slim scope   COLONOSCOPY WITH PROPOFOL N/A 11/23/2021   Procedure: COLONOSCOPY WITH PROPOFOL;  Surgeon: Wilford Corner, MD;  Location: [facility];  Service: Endoscopy;  Laterality: N/A;   DILATION AND CURETTAGE OF UTERUS     HEMORRHOID SURGERY      Family History  Problem Relation Age of Onset   Stroke Mother    Hyperlipidemia Mother    Hypertension Mother    Glaucoma Mother    Obesity Mother    Obesity Father    Cancer Father    Stroke Father    Hypertension Father    Social History:  reports that she has never smoked. She has been exposed to tobacco smoke. She has never used smokeless tobacco. She reports that she does not drink alcohol and does not use drugs.  Allergies:  Allergies  Allergen Reactions   Bee Venom Anaphylaxis  Propoxyphene Nausea And Vomiting    Allergy to Wygesic   Orange Juice [Orange Oil] Hives and Nausea And Vomiting   Oxycodone Hives    Medications Prior to Admission  Medication Sig Dispense Refill   acidophilus (RISAQUAD) CAPS capsule Take 1 capsule by mouth daily.     Ascorbic Acid (VITAMIN C) 1000 MG tablet Take 1,000 mg by mouth daily.     Cholecalciferol (VITAMIN D) 50 MCG (2000 UT) CAPS Take 2,000 Units by mouth daily.     cycloSPORINE (RESTASIS) 0.05 % ophthalmic emulsion Place 1 drop into both eyes daily as needed (dry  eyes).     estradiol (ESTRACE) 1 MG tablet Take 1 tablet (1 mg total) by mouth daily. (Patient taking differently: Take 1 mg by mouth at bedtime.) 90 tablet 3   Multiple Vitamins-Minerals (ALIVE WOMENS ENERGY) TABS Take 1 tablet by mouth every Monday, Wednesday, and Friday.     pantoprazole (PROTONIX) 20 MG tablet Take 1 tablet (20 mg total) by mouth every morning. 90 tablet 3   rosuvastatin (CRESTOR) 20 MG tablet Take 1 tablet (20 mg total) by mouth daily. 90 tablet 3   amoxicillin (AMOXIL) 500 MG capsule Take 4 capsules (2,000 mg total) by mouth 1 hour prior to appointment 12 capsule 2   estradiol (ESTRACE) 1 MG tablet Take 1 tablet (1 mg total) by mouth daily. (Patient not taking: Reported on 09/21/2022) 90 tablet 3    No results found for this or any previous visit (from the past 48 hour(s)). No results found.  Review of Systems  Musculoskeletal:  Positive for neck pain.  Neurological:  Positive for numbness.    Blood pressure (!) 146/74, pulse 77, temperature 98.2 F (36.8 C), temperature source Oral, resp. rate 17, height '5\' 4"'$  (1.626 m), weight 79.4 kg, SpO2 97 %. Physical Exam HENT:     Head: Normocephalic.     Right Ear: Tympanic membrane normal.     Nose: Nose normal.     Mouth/Throat:     Mouth: Mucous membranes are moist.  Cardiovascular:     Rate and Rhythm: Normal rate.     Pulses: Normal pulses.  Pulmonary:     Effort: Pulmonary effort is normal.  Abdominal:     General: Abdomen is flat.  Musculoskeletal:        General: Normal range of motion.  Neurological:     Mental Status: She is alert.     Comments: Strength is 5 out of 5 deltoid, bicep, tricep, wrist flexion, wrist extension, hand intrinsics.      Assessment/Plan 61 year old presents for ACDF C3-4 C4-5.  Removal of hardware C5-6  Elaina Hoops, MD 10/04/2022, 7:19 AM

## 2022-10-04 NOTE — Anesthesia Postprocedure Evaluation (Signed)
Anesthesia Post Note  Patient: JASIEL APACHITO  Procedure(s) Performed: ANTERIOR CERVICAL DISCECTOMY AND FUSION CERVICAL THREE-FOUR/CERVICAL FOUR -FIVE; REMOVAL OF HARDWARE CERVICAL FIVE-CERVICAL SEVEN     Patient location during evaluation: PACU Anesthesia Type: General Level of consciousness: awake and alert Pain management: pain level controlled Vital Signs Assessment: post-procedure vital signs reviewed and stable Respiratory status: spontaneous breathing, nonlabored ventilation, respiratory function stable and patient connected to nasal cannula oxygen Cardiovascular status: blood pressure returned to baseline and stable Postop Assessment: no apparent nausea or vomiting Anesthetic complications: no   No notable events documented.  Last Vitals:  Vitals:   10/04/22 1400 10/04/22 1507  BP: (!) 159/82 (!) 148/76  Pulse: (!) 53 (!) 59  Resp: 13 16  Temp: 37.2 C   SpO2: 95% 99%                 Effie Berkshire

## 2022-10-04 NOTE — Anesthesia Procedure Notes (Signed)
Procedure Name: Intubation Date/Time: 10/04/2022 7:48 AM  Performed by: Kyung Rudd, CRNAPre-anesthesia Checklist: Patient identified, Emergency Drugs available, Suction available and Patient being monitored Patient Re-evaluated:Patient Re-evaluated prior to induction Oxygen Delivery Method: Circle system utilized Preoxygenation: Pre-oxygenation with 100% oxygen Induction Type: IV induction Ventilation: Mask ventilation without difficulty Laryngoscope Size: Glidescope and 3 Tube type: Oral Tube size: 7.0 mm Number of attempts: 1 Airway Equipment and Method: Stylet and Video-laryngoscopy Placement Confirmation: ETT inserted through vocal cords under direct vision, positive ETCO2 and breath sounds checked- equal and bilateral Secured at: 21 cm Tube secured with: Tape Dental Injury: Teeth and Oropharynx as per pre-operative assessment  Comments: Grade I view on Glidescope screen.

## 2022-10-04 NOTE — Op Note (Signed)
Pre-operative diagnosis: Cervical spondylitic radiculopathy from severe cervical stenosis C3-4 C4-5.  Postoperative diagnosis: Same.  Procedure: Anterior cervical discectomies and fusion at C3-4 and C4-5 utilizing titanium cages packed with locally harvested autograft mixed with Vivigen and anterior cervical plating utilizing the globus resonate plating system.  Expiration fusion removal of hardware C5-6 with removal of Stryker aviator plate.  Surgeon: Kary Kos.  Assistant: Nash Shearer.  Anesthesia: General.  EBL: 100.  HPI: 61 year old female longstanding back and left shoulder and arm pain workup revealed progressive segmental degeneration above her fusion at C3-4 and C4-5.  Due to patient's failed conservative treatment imaging findings and progression of clinical syndrome I recommended anterior cervical discectomy and fusion at those 2 levels.  I extensively reviewed the risks and benefits of the operation with her as well as perioperative course expectations of outcome and alternatives of surgery and she understood and agreed to proceed forward.  Operative procedure: Patient was brought into the OR was induced under general anesthesia positioned supine the neck in slight extension 5 pounds halter traction.  The right side of her neck was prepped and draped in routine sterile fashion.  Preoperative x-ray localized the appropriate level.  Utilizing the old incision curvilinear incision was made just off the midline to the antebrachial sternocleidomastoid and superficial superficial layer platysma was dissected out divided longitudinally.  Extensive my scar tissue was dissected free to gain the plane down to the prevertebral fascia which was dissected away with Kitners.  I immediately identified the old plate this was dissected away then the longus coli was reflected laterally at the 2 disc bases above this.  Anterior osteophytes were bitten off with a Leksell rongeur and long self-retaining  retractors were placed both disc bases were incised scraped with an upgoing curette and drilled down capturing the bone shavings and mucus trap.  First under microscopic lamination working at C3-4 extensive posterior spurring was removed decompressing central canal both C4 pedicles foramina were identified and both C4 nerve roots were decompressed flush with the pedicle after removing the posterior longitudinal ligament in piecemeal fashion.  At the end of discectomy there is no further stenosis attention was then taken to C4-5 at this point the old plate was removed the fusion was inspected it was felt to be solid the C4-5 was then subsequently drilled down capturing the bone shavings and mucus trap there was marked posterior spurs coming off the endplates as well as marked uncinate perjury causing severe compression of the right C5 nerve root all this was removed decompressing central canal and skeletonizing both C5 nerve roots flush with the pedicle.  I then sized up a 6 mm cage for C4 5 to 7 mm for C3-4 there was a little bit of a step-off at C3-4 but after insertion of the cage it helped bring those back into alignment and then I utilized the old screw holes and C5 drilled new holes at C4 and C3 there were 2 screws that were wasted because they would not grab purchase and I converted length on one of them.  But ultimately all screws had excellent purchase locking mechanisms were engaged I packed additional bone graft material laterally and anteriorly cage underneath the plate then meticulous hemostasis was maintained the wound was copiously irrigated and closed the wound in layers with interrupted Vicryl and a running 4 subcuticular.  Dermabond benzoin Steri-Strips and a sterile dressing was applied patient recovery in stable condition.  At the end of the case all needle counts and  sponge counts were correct.

## 2022-10-04 NOTE — Transfer of Care (Signed)
Immediate Anesthesia Transfer of Care Note  Patient: Jillian Andrade  Procedure(s) Performed: ANTERIOR CERVICAL DISCECTOMY AND FUSION CERVICAL THREE-FOUR/CERVICAL FOUR -FIVE; REMOVAL OF HARDWARE CERVICAL FIVE-CERVICAL SEVEN  Patient Location: PACU  Anesthesia Type:General  Level of Consciousness: drowsy  Airway & Oxygen Therapy: Patient Spontanous Breathing and Patient connected to nasal cannula oxygen  Post-op Assessment: Report given to RN and Post -op Vital signs reviewed and stable  Post vital signs: Reviewed and stable  Last Vitals:  Vitals Value Taken Time  BP 145/81 10/04/22 1115  Temp 36.9 C 10/04/22 1115  Pulse 60 10/04/22 1119  Resp 10 10/04/22 1119  SpO2 97 % 10/04/22 1119  Vitals shown include unvalidated device data.  Last Pain:  Vitals:   10/04/22 1115  TempSrc:   PainSc: Asleep         Complications: No notable events documented.

## 2022-10-05 ENCOUNTER — Other Ambulatory Visit (HOSPITAL_COMMUNITY): Payer: Self-pay

## 2022-10-05 DIAGNOSIS — G40909 Epilepsy, unspecified, not intractable, without status epilepticus: Secondary | ICD-10-CM | POA: Diagnosis not present

## 2022-10-05 DIAGNOSIS — I341 Nonrheumatic mitral (valve) prolapse: Secondary | ICD-10-CM | POA: Diagnosis not present

## 2022-10-05 DIAGNOSIS — M1990 Unspecified osteoarthritis, unspecified site: Secondary | ICD-10-CM | POA: Diagnosis not present

## 2022-10-05 DIAGNOSIS — K219 Gastro-esophageal reflux disease without esophagitis: Secondary | ICD-10-CM | POA: Diagnosis not present

## 2022-10-05 DIAGNOSIS — M4802 Spinal stenosis, cervical region: Secondary | ICD-10-CM | POA: Diagnosis not present

## 2022-10-05 DIAGNOSIS — Z981 Arthrodesis status: Secondary | ICD-10-CM | POA: Diagnosis not present

## 2022-10-05 DIAGNOSIS — M4722 Other spondylosis with radiculopathy, cervical region: Secondary | ICD-10-CM | POA: Diagnosis not present

## 2022-10-05 DIAGNOSIS — Z79899 Other long term (current) drug therapy: Secondary | ICD-10-CM | POA: Diagnosis not present

## 2022-10-05 MED ORDER — HYDROCODONE-ACETAMINOPHEN 5-325 MG PO TABS
2.0000 | ORAL_TABLET | ORAL | 0 refills | Status: DC | PRN
  Filled 2022-10-05: qty 30, 3d supply, fill #0

## 2022-10-05 MED FILL — Thrombin For Soln 5000 Unit: CUTANEOUS | Qty: 2 | Status: AC

## 2022-10-05 NOTE — Plan of Care (Signed)
  Problem: Education: Goal: Ability to verbalize activity precautions or restrictions will improve Outcome: Completed/Met Goal: Knowledge of the prescribed therapeutic regimen will improve Outcome: Completed/Met Goal: Understanding of discharge needs will improve Outcome: Completed/Met   Problem: Activity: Goal: Ability to avoid complications of mobility impairment will improve Outcome: Completed/Met Goal: Ability to tolerate increased activity will improve Outcome: Completed/Met Goal: Will remain free from falls Outcome: Completed/Met   Problem: Bowel/Gastric: Goal: Gastrointestinal status for postoperative course will improve Outcome: Completed/Met  Patient alert and oriented, void, ambulate. Surgical site clean and dry. D/c instruction explain and given to the patient all questions answered. Will d/c patient home per order.

## 2022-10-05 NOTE — Progress Notes (Signed)
Physical Therapy Note  Spoke with occupational therapy after initial evaluation. OT reports patient is functioning at a high level of independence and no physical therapy is indicated at this time. PT is signing-off. Please re-order if there is any significant change in status. Thank you for this referral.  Donnamae Muilenburg, PT, DPT Physical Therapist Acute Rehabilitation Services [facility] Outpatient Rehabilitation Services [facility]    

## 2022-10-05 NOTE — Evaluation (Signed)
Occupational Therapy Evaluation Patient Details Name: Jillian Andrade MRN: [id number] DOB: Apr 22, 1961 Today's Date: 10/05/2022   History of Present Illness 61 yo s/p ACDF at C3-4 and C4-5. PMH: ACDF C5-6, ankle surgery.   Clinical Impression   Completed all education regarding cervical precautions for ADL and functional mobility. Pt independent with mobility and demonstrated understanding of precautions. Issued HEP for grip/pinch strengthening with squeeze ball and level 3 theraputty. Pt has made ergonomic adjustments of her workspace. Pt will have available assistance after DC.No further OT needs.      Recommendations for follow up therapy are one component of a multi-disciplinary discharge planning process, led by the attending physician.  Recommendations may be updated based on patient status, additional functional criteria and insurance authorization.   Follow Up Recommendations  No OT follow up     Assistance Recommended at Discharge Intermittent Supervision/Assistance  Patient can return home with the following Assistance with cooking/housework;Assist for transportation    Functional Status Assessment  Patient has not had a recent decline in their functional status  Equipment Recommendations  None recommended by OT    Recommendations for Other Services       Precautions / Restrictions Precautions Precautions: Cervical Required Braces or Orthoses: Cervical Brace Cervical Brace: Soft collar;For comfort      Mobility Bed Mobility Overal bed mobility: Modified Independent                  Transfers Overall transfer level: Independent                        Balance Overall balance assessment: No apparent balance deficits (not formally assessed)                                         ADL either performed or assessed with clinical judgement   ADL Overall ADL's : Needs assistance/impaired                                      Functional mobility during ADLs: Independent General ADL Comments: Educated on compensatory strategies. Pt able to return demonstrate while dresisng. Educated on home safty and set up and recommended using shoer seat to increase adherance to cervical precautions and reduce risk of falls. written cervical precaution handout reviewed. Pt states she plans to initially sleep in her recliner - pt aware of proper tecnique for bed mobility     Vision         Perception     Praxis      Pertinent Vitals/Pain Pain Assessment Pain Assessment: 0-10 Pain Score: 5  Pain Location: neck Pain Descriptors / Indicators: Discomfort, Aching Pain Intervention(s): Limited activity within patient's tolerance     Hand Dominance Right   Extremity/Trunk Assessment Upper Extremity Assessment Upper Extremity Assessment: LUE deficits/detail LUE Deficits / Details: mild deficits with grip and pinch strength LUE Sensation:  (tingling in medican n distribution on fingertips; reports much better than pre-op)       Cervical / Trunk Assessment Cervical / Trunk Assessment: Neck Surgery   Communication     Cognition Arousal/Alertness: Awake/alert Behavior During Therapy: WFL for tasks assessed/performed Overall Cognitive Status: Within Functional Limits for tasks assessed  General Comments       Exercises Exercises: Other exercises Other Exercises Other Exercises: grip/pinch strengthening with squeeze ball and level 3 theraputty Other Exercises: fine motor/coordination Other Exercises: intrinsic hand strnegthening   Shoulder Instructions      Home Living Family/patient expects to be discharged to:: Private residence Living Arrangements: Alone Available Help at Discharge: Friend(s);Available 24 hours/day Type of Home: House Home Access: Stairs to enter [employer] of Steps: 1   Home Layout: One level     Bathroom  Shower/Tub: Corporate investment banker: Standard Bathroom Accessibility: Yes How Accessible: Accessible via walker Home Equipment: [place] (2 wheels);Cane - single point;BSC/3in1;Shower seat;Wheelchair - manual          Prior Functioning/Environment Prior Level of Function : Independent/Modified Independent;Working/employed;Driving (works as Network engineer in [employer]; increased difficulty wtih ADL adn wrok tasks prior to surgery due to L hand numbness/weakness)                        OT Problem List: Decreased strength;Decreased coordination;Pain      OT Treatment/Interventions:      OT Goals(Current goals can be found in the care plan section) Acute Rehab OT Goals Patient Stated Goal: to have better use of L hand and no neck pain OT Goal Formulation: All assessment and education complete, DC therapy  OT Frequency:      Co-evaluation              AM-PAC OT "6 Clicks" Daily Activity     Outcome Measure Help from another person eating meals?: None Help from another person taking care of personal grooming?: None Help from another person toileting, which includes using toliet, bedpan, or urinal?: None Help from another person bathing (including washing, rinsing, drying)?: None Help from another person to put on and taking off regular upper body clothing?: None Help from another person to put on and taking off regular lower body clothing?: None 6 Click Score: 24   End of Session Nurse Communication: Other (comment) (DC needs)  Activity Tolerance: Patient tolerated treatment well Patient left: in bed;with call bell/phone within reach  OT Visit Diagnosis: Pain;Muscle weakness (generalized) (M62.81) Pain - part of body:  (neck/post-op)                Time: 7026-3785 OT Time Calculation (min): 21 min Charges:  OT General Charges $OT Visit: 1 Visit OT Evaluation $OT Eval Low Complexity: [place], OT/L   Acute OT Clinical  Specialist Acute Rehabilitation Services Pager [phone] Office [phone]   [facility] 10/05/2022, 9:17 AM

## 2022-10-05 NOTE — Progress Notes (Signed)
Orthopedic Tech Progress Note Patient Details:  Jillian Andrade [birth year] [id number]  Patient ID: [name], female   DOB: 1960/12/15, 61 y.o.   MRN: [id number] I spoke with rn. They said patient has collar on. Karolee Stamps 10/05/2022, 6:09 AM

## 2022-10-05 NOTE — Discharge Summary (Signed)
  Physician Discharge Summary  Patient ID: Jillian Andrade MRN: [id number] DOB/AGE: [birth year] 61 y.o. Estimated body mass index is 30.04 kg/m as calculated from the following:   Height as of this encounter: '5\' 4"'$  (1.626 m).   Weight as of this encounter: 79.4 kg.   Admit date: 10/04/2022 Discharge date: 10/05/2022  Admission Diagnoses: Cervical spondylosis with spinal stenosis C3-4 C4-5  Discharge Diagnoses: Same Principal Problem:   Spinal stenosis in cervical region   Discharged Condition: good  Hospital Course: Patient was admitted to the hospital underwent an ACDF at C3-4 and C4-5 postoperatively patient did very well with covering the floor on the floor was ambulating and voiding spontaneously tolerating regular diet stable for discharge home.  Patient will be discharged with scheduled follow-up in 1 to 2 weeks.  Consults: Significant Diagnostic Studies: Treatments: ACDF C3-4 C4-5 Discharge Exam: Blood pressure (!) 141/70, pulse (!) 58, temperature 98.8 F (37.1 C), temperature source Oral, resp. rate 18, height '5\' 4"'$  (1.626 m), weight 79.4 kg, SpO2 97 %. Strength 5 out of 5 and clean dry and intact  Disposition: Home   Allergies as of 10/05/2022       Reactions   Bee Venom Anaphylaxis   Propoxyphene Nausea And Vomiting   Allergy to Wygesic   Orange Juice [orange Oil] Hives, Nausea And Vomiting   Oxycodone Hives        Medication List     TAKE these medications    acidophilus Caps capsule Take 1 capsule by mouth daily.   Alive Womens Energy Tabs Take 1 tablet by mouth every Monday, Wednesday, and Friday.   amoxicillin 500 MG capsule Commonly known as: AMOXIL Take 4 capsules (2,000 mg total) by mouth 1 hour prior to appointment   cycloSPORINE 0.05 % ophthalmic emulsion Commonly known as: RESTASIS Place 1 drop into both eyes daily as needed (dry eyes).   estradiol 1 MG tablet Commonly known as: ESTRACE Take 1 tablet (1 mg total) by mouth  daily. What changed: when to take this   HYDROcodone-acetaminophen 5-325 MG tablet Commonly known as: NORCO/VICODIN Take 2 tablets by mouth every 4 (four) hours as needed for severe pain ((score 7 to 10)).   pantoprazole 20 MG tablet Commonly known as: PROTONIX Take 1 tablet (20 mg total) by mouth every morning.   rosuvastatin 20 MG tablet Commonly known as: CRESTOR Take 1 tablet (20 mg total) by mouth daily.   vitamin C 1000 MG tablet Take 1,000 mg by mouth daily.   Vitamin D 50 MCG (2000 UT) Caps Take 2,000 Units by mouth daily.         Signed: Elaina Hoops 10/05/2022, 8:26 AM

## 2022-10-18 ENCOUNTER — Encounter (HOSPITAL_COMMUNITY): Payer: Self-pay | Admitting: Neurosurgery

## 2022-11-15 DIAGNOSIS — M5412 Radiculopathy, cervical region: Secondary | ICD-10-CM | POA: Diagnosis not present

## 2023-02-07 ENCOUNTER — Other Ambulatory Visit (HOSPITAL_COMMUNITY): Payer: Self-pay

## 2023-03-06 ENCOUNTER — Other Ambulatory Visit: Payer: Self-pay

## 2023-03-21 ENCOUNTER — Other Ambulatory Visit: Payer: Self-pay | Admitting: Internal Medicine

## 2023-03-21 DIAGNOSIS — Z1231 Encounter for screening mammogram for malignant neoplasm of breast: Secondary | ICD-10-CM

## 2023-04-05 ENCOUNTER — Other Ambulatory Visit (INDEPENDENT_AMBULATORY_CARE_PROVIDER_SITE_OTHER): Payer: Commercial Managed Care - PPO

## 2023-04-05 DIAGNOSIS — E538 Deficiency of other specified B group vitamins: Secondary | ICD-10-CM | POA: Diagnosis not present

## 2023-04-05 DIAGNOSIS — E7800 Pure hypercholesterolemia, unspecified: Secondary | ICD-10-CM

## 2023-04-05 DIAGNOSIS — E559 Vitamin D deficiency, unspecified: Secondary | ICD-10-CM

## 2023-04-05 DIAGNOSIS — R739 Hyperglycemia, unspecified: Secondary | ICD-10-CM

## 2023-04-05 LAB — URINALYSIS, ROUTINE W REFLEX MICROSCOPIC
Bilirubin Urine: NEGATIVE
Hgb urine dipstick: NEGATIVE
Ketones, ur: NEGATIVE
Leukocytes,Ua: NEGATIVE
Nitrite: NEGATIVE
Specific Gravity, Urine: 1.025 (ref 1.000–1.030)
Total Protein, Urine: NEGATIVE
Urine Glucose: NEGATIVE
Urobilinogen, UA: 0.2 (ref 0.0–1.0)
pH: 6 (ref 5.0–8.0)

## 2023-04-05 LAB — VITAMIN D 25 HYDROXY (VIT D DEFICIENCY, FRACTURES): VITD: 80.19 ng/mL (ref 30.00–100.00)

## 2023-04-05 LAB — CBC WITH DIFFERENTIAL/PLATELET
Basophils Absolute: 0 10*3/uL (ref 0.0–0.1)
Basophils Relative: 0.4 % (ref 0.0–3.0)
Eosinophils Absolute: 0.1 10*3/uL (ref 0.0–0.7)
Eosinophils Relative: 1.5 % (ref 0.0–5.0)
HCT: 40 % (ref 36.0–46.0)
Hemoglobin: 13.2 g/dL (ref 12.0–15.0)
Lymphocytes Relative: 24.3 % (ref 12.0–46.0)
Lymphs Abs: 1.8 10*3/uL (ref 0.7–4.0)
MCHC: 33 g/dL (ref 30.0–36.0)
MCV: 89 fl (ref 78.0–100.0)
Monocytes Absolute: 0.5 10*3/uL (ref 0.1–1.0)
Monocytes Relative: 6.6 % (ref 3.0–12.0)
Neutro Abs: 4.8 10*3/uL (ref 1.4–7.7)
Neutrophils Relative %: 67.2 % (ref 43.0–77.0)
Platelets: 130 10*3/uL — ABNORMAL LOW (ref 150.0–400.0)
RBC: 4.49 Mil/uL (ref 3.87–5.11)
RDW: 13.5 % (ref 11.5–15.5)
WBC: 7.2 10*3/uL (ref 4.0–10.5)

## 2023-04-05 LAB — LIPID PANEL
Cholesterol: 131 mg/dL (ref 0–200)
HDL: 61.5 mg/dL (ref >39.00)
LDL Cholesterol: 59 mg/dL (ref 0–99)
NonHDL: 69.19
Total CHOL/HDL Ratio: 2
Triglycerides: 50 mg/dL (ref 0.0–149.0)
VLDL: 10 mg/dL (ref 0.0–40.0)

## 2023-04-05 LAB — HEPATIC FUNCTION PANEL
ALT: 16 U/L (ref 0–35)
AST: 21 U/L (ref 0–37)
Albumin: 4.2 g/dL (ref 3.5–5.2)
Alkaline Phosphatase: 54 U/L (ref 39–117)
Bilirubin, Direct: 0.1 mg/dL (ref 0.0–0.3)
Total Bilirubin: 0.5 mg/dL (ref 0.2–1.2)
Total Protein: 7.1 g/dL (ref 6.0–8.3)

## 2023-04-05 LAB — BASIC METABOLIC PANEL
BUN: 17 mg/dL (ref 6–23)
CO2: 26 mEq/L (ref 19–32)
Calcium: 9.3 mg/dL (ref 8.4–10.5)
Chloride: 108 mEq/L (ref 96–112)
Creatinine, Ser: 0.89 mg/dL (ref 0.40–1.20)
GFR: 70.78 mL/min (ref >60.00)
Glucose, Bld: 128 mg/dL — ABNORMAL HIGH (ref 70–99)
Potassium: 3.7 mEq/L (ref 3.5–5.1)
Sodium: 141 mEq/L (ref 135–145)

## 2023-04-05 LAB — VITAMIN B12: Vitamin B-12: 871 pg/mL (ref 211–911)

## 2023-04-05 LAB — HEMOGLOBIN A1C: Hgb A1c MFr Bld: 5.9 % (ref 4.6–6.5)

## 2023-04-05 LAB — TSH: TSH: 0.73 u[IU]/mL (ref 0.35–5.50)

## 2023-04-15 ENCOUNTER — Encounter: Payer: Self-pay | Admitting: Internal Medicine

## 2023-04-15 ENCOUNTER — Ambulatory Visit: Payer: Commercial Managed Care - PPO | Admitting: Internal Medicine

## 2023-04-15 VITALS — BP 126/80 | HR 50 | Temp 98.9°F | Ht 64.0 in | Wt 192.0 lb

## 2023-04-15 DIAGNOSIS — E7800 Pure hypercholesterolemia, unspecified: Secondary | ICD-10-CM

## 2023-04-15 DIAGNOSIS — M5412 Radiculopathy, cervical region: Secondary | ICD-10-CM | POA: Diagnosis not present

## 2023-04-15 DIAGNOSIS — E538 Deficiency of other specified B group vitamins: Secondary | ICD-10-CM

## 2023-04-15 DIAGNOSIS — Z0000 Encounter for general adult medical examination without abnormal findings: Secondary | ICD-10-CM

## 2023-04-15 DIAGNOSIS — R7303 Prediabetes: Secondary | ICD-10-CM | POA: Diagnosis not present

## 2023-04-15 DIAGNOSIS — Z0001 Encounter for general adult medical examination with abnormal findings: Secondary | ICD-10-CM

## 2023-04-15 DIAGNOSIS — M1990 Unspecified osteoarthritis, unspecified site: Secondary | ICD-10-CM | POA: Insufficient documentation

## 2023-04-15 DIAGNOSIS — E559 Vitamin D deficiency, unspecified: Secondary | ICD-10-CM

## 2023-04-15 DIAGNOSIS — R87619 Unspecified abnormal cytological findings in specimens from cervix uteri: Secondary | ICD-10-CM | POA: Insufficient documentation

## 2023-04-15 NOTE — Progress Notes (Unsigned)
Patient ID: Jillian Andrade, female   DOB: [birth year], 62 y.o.   MRN: [id number]         Chief Complaint:: wellness exam and b12 deficiency, hld, preMD, low vit d       HPI:  Jillian Andrade is a 62 y.o. female here for wellness exam; up to date                Also s/p NS dec 2024 with now resolved LUE radiculopathy.  Pt denies chest pain, increased sob or doe, wheezing, orthopnea, PND, increased LE swelling, palpitations, dizziness or syncope.   Pt denies polydipsia, polyuria, or new focal neuro s/s.    Pt denies fever, wt loss, night sweats, loss of appetite, or other constitutional symptoms     Wt Readings from Last 3 Encounters:  04/15/23 192 lb (87.1 kg)  10/04/22 175 lb (79.4 kg)  09/24/22 179 lb 14.4 oz (81.6 kg)   BP Readings from Last 3 Encounters:  04/15/23 126/80  10/05/22 134/67  09/24/22 90/71   Immunization History  Administered Date(s) Administered   Influenza,inj,Quad PF,6+ Mos 07/18/2015   Influenza-Unspecified 07/12/2017, 07/28/2018, 08/07/2020, 07/31/2021   PFIZER(Purple Top)SARS-COV-2 Vaccination 10/24/2019, 11/12/2019, 08/12/2020   Tdap 04/12/2015   Zoster Recombinant(Shingrix) 09/29/2021, 12/08/2021  There are no preventive care reminders to display for this patient.    Past Medical History:  Diagnosis Date   Allergic rhinitis 03/06/2019   Anemia    Bilateral swelling of feet    Chest pain    Complication of anesthesia    n/v   Constipation    Epilepsy (HCC)    Fibroids in ovaries    pain in left side   GERD (gastroesophageal reflux disease)    no medications at this time   Hand osteomyelitis (HCC) 03/06/2019   Heart murmur    back in the 90s with the mitral valve prolapse. Quavon Keisling, APP, listened to heart sounds 09/24/22- no murmur auscultated   High cholesterol    Insomnia 05/01/2016   Lactose intolerance    Lumbar disc disease 05/01/2016   Multiple food allergies    MVP (mitral valve prolapse) mild, no cardiologist   No pertinent past medical  history    PONV (postoperative nausea and vomiting)    Renal cyst, right 2022   SOB (shortness of breath)    Swallowing difficulty    Vitamin D deficiency    Past Surgical History:  Procedure Laterality Date   ABDOMINAL HYSTERECTOMY     ANKLE ARTHROSCOPY  10/31/2011   Procedure: ANKLE ARTHROSCOPY;  Surgeon: Sherri Rad, MD;  Location: [facility];  Service: Orthopedics;  Laterality: Right;  RIGHT ANKLE ARTHROSCOPY WITH EXTENSIVE DEBRIDEMENT   ANTERIOR CERVICAL DECOMP/DISCECTOMY FUSION N/A 10/04/2016   Procedure: CERVICAL FIVE-CERVICAL SIX  ANTERIOR CERVICAL DECOMPRESSION/DISCECTOMY/FUSION;  Surgeon: Maeola Harman, MD;  Location: [facility] OR;  Service: Neurosurgery;  Laterality: N/A;  C5-6 ANTERIOR CERVICAL DECOMPRESSION/DISCECTOMY/FUSION   ANTERIOR CERVICAL DECOMP/DISCECTOMY FUSION N/A 10/04/2022   Procedure: ANTERIOR CERVICAL DISCECTOMY AND FUSION CERVICAL THREE-FOUR/CERVICAL FOUR -FIVE; REMOVAL OF HARDWARE CERVICAL FIVE-CERVICAL SEVEN;  Surgeon: Donalee Citrin, MD;  Location: [facility] OR;  Service: Neurosurgery;  Laterality: N/A;   BIOPSY  11/23/2021   Procedure: BIOPSY;  Surgeon: Charlott Rakes, MD;  Location: [facility];  Service: Endoscopy;;   COLONOSCOPY     COLONOSCOPY N/A 03/02/2014   Procedure: COLONOSCOPY;  Surgeon: Florencia Reasons, MD;  Location: [facility];  Service: Endoscopy;  Laterality: N/A;  ultra slim scope   COLONOSCOPY WITH  PROPOFOL N/A 11/23/2021   Procedure: COLONOSCOPY WITH PROPOFOL;  Surgeon: Charlott Rakes, MD;  Location: [facility];  Service: Endoscopy;  Laterality: N/A;   DILATION AND CURETTAGE OF UTERUS     HEMORRHOID SURGERY      reports that she has never smoked. She has been exposed to tobacco smoke. She has never used smokeless tobacco. She reports that she does not drink alcohol and does not use drugs. family history includes Cancer in her father; Glaucoma in her mother; Hyperlipidemia in her mother; Hypertension in her father and mother; Obesity in  her father and mother; Stroke in her father and mother. Allergies  Allergen Reactions   Bee Venom Anaphylaxis   Propoxyphene Nausea And Vomiting    Allergy to Wygesic   Orange Juice [Orange Oil] Hives and Nausea And Vomiting   Oxycodone Hives   Current Outpatient Medications on File Prior to Visit  Medication Sig Dispense Refill   acidophilus (RISAQUAD) CAPS capsule Take 1 capsule by mouth daily.     Ascorbic Acid (VITAMIN C) 1000 MG tablet Take 1,000 mg by mouth daily.     Cholecalciferol (VITAMIN D) 50 MCG (2000 UT) CAPS Take 2,000 Units by mouth daily.     cycloSPORINE (RESTASIS) 0.05 % ophthalmic emulsion Place 1 drop into both eyes daily as needed (dry eyes).     estradiol (ESTRACE) 1 MG tablet Take 1 tablet (1 mg total) by mouth daily. (Patient taking differently: Take 1 mg by mouth at bedtime.) 90 tablet 3   HYDROcodone-acetaminophen (NORCO/VICODIN) 5-325 MG tablet Take 2 tablets by mouth every 4 (four) hours as needed for severe pain ((score 7 to 10)). 30 tablet 0   Multiple Vitamins-Minerals (ALIVE WOMENS ENERGY) TABS Take 1 tablet by mouth every Monday, Wednesday, and Friday.     pantoprazole (PROTONIX) 20 MG tablet Take 1 tablet (20 mg total) by mouth every morning. 90 tablet 3   rosuvastatin (CRESTOR) 20 MG tablet Take 1 tablet (20 mg total) by mouth daily. 90 tablet 3   amoxicillin (AMOXIL) 500 MG capsule Take 4 capsules (2,000 mg total) by mouth 1 hour prior to appointment (Patient not taking: Reported on 04/15/2023) 12 capsule 2   No current facility-administered medications on file prior to visit.        ROS:  All others reviewed and negative.  Objective        PE:  BP 126/80 (BP Location: Right Arm, Patient Position: Sitting, Cuff Size: Normal)   Pulse (!) 50   Temp 98.9 F (37.2 C) (Oral)   Ht 5\' 4"  (1.626 m)   Wt 192 lb (87.1 kg)   SpO2 97%   BMI 32.96 kg/m                 Constitutional: Pt appears in NAD               HENT: Head: NCAT.                Right  Ear: External ear normal.                 Left Ear: External ear normal.                Eyes: . Pupils are equal, round, and reactive to light. Conjunctivae and EOM are normal               Nose: without d/c or deformity  Neck: Neck supple. Gross normal ROM               Cardiovascular: Normal rate and regular rhythm.                 Pulmonary/Chest: Effort normal and breath sounds without rales or wheezing.                Abd:  Soft, NT, ND, + BS, no organomegaly               Neurological: Pt is alert. At baseline orientation, motor grossly intact               Skin: Skin is warm. No rashes, no other new lesions, LE edema - none               Psychiatric: Pt behavior is normal without agitation   Micro: none  Cardiac tracings I have personally interpreted today:  none  Pertinent Radiological findings (summarize): none   Lab Results  Component Value Date   WBC 7.2 04/05/2023   HGB 13.2 04/05/2023   HCT 40.0 04/05/2023   PLT 130.0 (L) 04/05/2023   GLUCOSE 128 (H) 04/05/2023   CHOL 131 04/05/2023   TRIG 50.0 04/05/2023   HDL 61.50 04/05/2023   LDLCALC 59 04/05/2023   ALT 16 04/05/2023   AST 21 04/05/2023   NA 141 04/05/2023   K 3.7 04/05/2023   CL 108 04/05/2023   CREATININE 0.89 04/05/2023   BUN 17 04/05/2023   CO2 26 04/05/2023   TSH 0.73 04/05/2023   HGBA1C 5.9 04/05/2023   Assessment/Plan:  Jillian Andrade is a 62 y.o. Black or African American [2] female with  has a past medical history of Allergic rhinitis (03/06/2019), Anemia, Bilateral swelling of feet, Chest pain, Complication of anesthesia, Constipation, Epilepsy (HCC), Fibroids (in ovaries ), GERD (gastroesophageal reflux disease), Hand osteomyelitis (HCC) (03/06/2019), Heart murmur, High cholesterol, Insomnia (05/01/2016), Lactose intolerance, Lumbar disc disease (05/01/2016), Multiple food allergies, MVP (mitral valve prolapse) (mild, no cardiologist), No pertinent past medical history, PONV  (postoperative nausea and vomiting), Renal cyst, right (2022), SOB (shortness of breath), Swallowing difficulty, and Vitamin D deficiency.  Encounter for well adult exam with abnormal findings Age and sex appropriate education and counseling updated with regular exercise and diet Referrals for preventative services - none needed Immunizations addressed - none needed Smoking counseling  - none needed Evidence for depression or other mood disorder - none significant Most recent labs reviewed. I have personally reviewed and have noted: 1) the patient's medical and social history 2) The patient's current medications and supplements 3) The patient's height, weight, and BMI have been recorded in the chart   B12 deficiency Lab Results  Component Value Date   VITAMINB12 871 04/05/2023   Stable, cont oral replacement - b12 1000 mcg qd   HLD (hyperlipidemia) Lab Results  Component Value Date   LDLCALC 59 04/05/2023   Stable, pt to continue current statin crestor 20 qd    Prediabetes Lab Results  Component Value Date   HGBA1C 5.9 04/05/2023   Stable, pt to continue current medical treatment  - diet, wt control  Left cervical radiculopathy Clinically resolved with recent surgury,  to f/u any worsening symptoms or concerns  Vitamin D deficiency Last vitamin D Lab Results  Component Value Date   VD25OH 80.19 04/05/2023   Stable, cont oral replacement  Followup: Return in about 1 year (around 04/14/2024).  Oliver Barre, MD 04/18/2023 2:30 PM  [facility]

## 2023-04-15 NOTE — Patient Instructions (Signed)

## 2023-04-18 ENCOUNTER — Encounter: Payer: Self-pay | Admitting: Internal Medicine

## 2023-04-18 NOTE — Assessment & Plan Note (Signed)
Lab Results  Component Value Date   HGBA1C 5.9 04/05/2023   Stable, pt to continue current medical treatment  - diet, wt control

## 2023-04-18 NOTE — Assessment & Plan Note (Signed)
Lab Results  Component Value Date   LDLCALC 59 04/05/2023   Stable, pt to continue current statin crestor 20 qd

## 2023-04-18 NOTE — Assessment & Plan Note (Signed)
Clinically resolved with recent surgury,  to f/u any worsening symptoms or concerns

## 2023-04-18 NOTE — Assessment & Plan Note (Signed)

## 2023-04-18 NOTE — Assessment & Plan Note (Signed)
Last vitamin D Lab Results  Component Value Date   VD25OH 80.19 04/05/2023   Stable, cont oral replacement

## 2023-04-18 NOTE — Assessment & Plan Note (Signed)
Lab Results  Component Value Date   VITAMINB12 871 04/05/2023   Stable, cont oral replacement - b12 1000 mcg qd

## 2023-05-05 ENCOUNTER — Other Ambulatory Visit: Payer: Self-pay | Admitting: Internal Medicine

## 2023-05-06 ENCOUNTER — Ambulatory Visit: Admission: RE | Admit: 2023-05-06 | Payer: Commercial Managed Care - PPO | Source: Ambulatory Visit

## 2023-05-06 ENCOUNTER — Other Ambulatory Visit (HOSPITAL_COMMUNITY): Payer: Self-pay

## 2023-05-06 ENCOUNTER — Other Ambulatory Visit: Payer: Self-pay

## 2023-05-06 DIAGNOSIS — Z1231 Encounter for screening mammogram for malignant neoplasm of breast: Secondary | ICD-10-CM

## 2023-05-06 MED ORDER — ROSUVASTATIN CALCIUM 20 MG PO TABS
20.0000 mg | ORAL_TABLET | Freq: Every day | ORAL | 3 refills | Status: DC
  Filled 2023-05-06: qty 90, 90d supply, fill #0
  Filled 2023-08-05: qty 90, 90d supply, fill #1
  Filled 2023-11-01: qty 90, 90d supply, fill #2
  Filled 2024-01-31: qty 90, 90d supply, fill #3

## 2023-05-10 ENCOUNTER — Other Ambulatory Visit: Payer: Self-pay | Admitting: Internal Medicine

## 2023-05-10 DIAGNOSIS — N644 Mastodynia: Secondary | ICD-10-CM

## 2023-06-14 ENCOUNTER — Ambulatory Visit: Payer: Commercial Managed Care - PPO

## 2023-06-14 ENCOUNTER — Ambulatory Visit
Admission: RE | Admit: 2023-06-14 | Discharge: 2023-06-14 | Disposition: A | Discharge status: Home / Self Care | Payer: Commercial Managed Care - PPO | Source: Ambulatory Visit | Attending: Internal Medicine | Admitting: Internal Medicine

## 2023-06-14 DIAGNOSIS — N644 Mastodynia: Secondary | ICD-10-CM

## 2023-06-19 ENCOUNTER — Other Ambulatory Visit: Payer: Self-pay

## 2023-06-19 ENCOUNTER — Other Ambulatory Visit: Payer: Self-pay | Admitting: Internal Medicine

## 2023-06-19 ENCOUNTER — Other Ambulatory Visit (HOSPITAL_COMMUNITY): Payer: Self-pay

## 2023-06-19 MED ORDER — PANTOPRAZOLE SODIUM 20 MG PO TBEC
20.0000 mg | DELAYED_RELEASE_TABLET | Freq: Every morning | ORAL | 3 refills | Status: DC
  Filled 2023-06-19: qty 90, 90d supply, fill #0
  Filled 2023-09-30: qty 90, 90d supply, fill #1
  Filled 2023-12-27: qty 90, 90d supply, fill #2
  Filled 2024-03-27: qty 90, 90d supply, fill #3

## 2023-07-15 DIAGNOSIS — Z961 Presence of intraocular lens: Secondary | ICD-10-CM | POA: Diagnosis not present

## 2023-07-15 DIAGNOSIS — H04123 Dry eye syndrome of bilateral lacrimal glands: Secondary | ICD-10-CM | POA: Diagnosis not present

## 2023-07-15 DIAGNOSIS — H26491 Other secondary cataract, right eye: Secondary | ICD-10-CM | POA: Diagnosis not present

## 2023-07-15 DIAGNOSIS — H43813 Vitreous degeneration, bilateral: Secondary | ICD-10-CM | POA: Diagnosis not present

## 2023-07-19 ENCOUNTER — Ambulatory Visit (HOSPITAL_BASED_OUTPATIENT_CLINIC_OR_DEPARTMENT_OTHER): Payer: Commercial Managed Care - PPO

## 2023-07-19 ENCOUNTER — Encounter: Payer: Self-pay | Admitting: Cardiology

## 2023-07-19 ENCOUNTER — Ambulatory Visit: Payer: Commercial Managed Care - PPO | Attending: Cardiology | Admitting: Cardiology

## 2023-07-19 VITALS — BP 124/80 | HR 55 | Ht 64.0 in | Wt 198.6 lb

## 2023-07-19 DIAGNOSIS — R0609 Other forms of dyspnea: Secondary | ICD-10-CM

## 2023-07-19 DIAGNOSIS — I341 Nonrheumatic mitral (valve) prolapse: Secondary | ICD-10-CM

## 2023-07-19 DIAGNOSIS — R072 Precordial pain: Secondary | ICD-10-CM

## 2023-07-19 DIAGNOSIS — Z7689 Persons encountering health services in other specified circumstances: Secondary | ICD-10-CM | POA: Diagnosis not present

## 2023-07-19 LAB — ECHOCARDIOGRAM COMPLETE
Area-P 1/2: 2.97 cm2
Height: 64 in
S' Lateral: 2.4 cm
Weight: 3177.6 [oz_av]

## 2023-07-19 NOTE — Progress Notes (Unsigned)
Patient agreement reviewed and signed on 07/19/2023.  WatchPAT issued to patient on 07/19/2023 by Donneta Romberg, CMA. Patient aware to not open the WatchPAT box until contacted with the activation PIN. Patient profile initialized in CloudPAT on 07/19/2023 by [name] Hidalgo. Device serial number: [id number]  Please list Reason for Call as Advice Only and type "WatchPAT issued to patient" in the comment box.

## 2023-07-19 NOTE — Progress Notes (Unsigned)
Cardiology Office Note:    Date:  07/19/2023   ID:  Jillian Andrade, DOB 22-Mar-1961, MRN [id number]  PCP:  Corwin Levins, MD  Cardiologist:  None  Electrophysiologist:  None   Referring MD: Corwin Levins, MD   Chief Complaint  Patient presents with   Chest Pain    Pt says its comes and goes.     History of Present Illness:    Jillian Andrade is a 62 y.o. female with a hx of  mitral valve prolapse, Prediabetes, Hyperlipidemia and obesity presents with shortness of breath and chest pain.  The chest pain is described as a sharp, stabbing sensation, with a burning sensation at the tip of the knife at time it is a pressure sensation. The pain is constant and does not move around. The patient denies any associated shortness of breath which is progressive on exertion but reports feeling more tired than usual.   The patient also reports a history of ectopic pregnancy in 1994, which resulted in an emergency hysterectomy at the age of 49, leading to premature menopause. The patient also has a history of a large tumor wrapped around the ovaries, bowel, and bladder.  The patient has a family history of cardiomyopathy in a first cousin who passed away unexpectedly at the age of 43. Another first cousin is currently undergoing testing for a similar condition after experiencing blackouts while driving.  The patient also reports a history of snoring and feeling unrested in the morning, suggesting possible sleep apnea.  Past Medical History:  Diagnosis Date   Allergic rhinitis 03/06/2019   Anemia    Bilateral swelling of feet    Chest pain    Complication of anesthesia    n/v   Constipation    Epilepsy (HCC)    Fibroids in ovaries    pain in left side   GERD (gastroesophageal reflux disease)    no medications at this time   Hand osteomyelitis (HCC) 03/06/2019   Heart murmur    back in the 90s with the mitral valve prolapse. James, APP, listened to heart sounds 09/24/22- no murmur  auscultated   High cholesterol    Insomnia 05/01/2016   Lactose intolerance    Lumbar disc disease 05/01/2016   Multiple food allergies    MVP (mitral valve prolapse) mild, no cardiologist   No pertinent past medical history    PONV (postoperative nausea and vomiting)    Renal cyst, right 2022   SOB (shortness of breath)    Swallowing difficulty    Vitamin D deficiency     Past Surgical History:  Procedure Laterality Date   ABDOMINAL HYSTERECTOMY     ANKLE ARTHROSCOPY  10/31/2011   Procedure: ANKLE ARTHROSCOPY;  Surgeon: Sherri Rad, MD;  Location: [facility];  Service: Orthopedics;  Laterality: Right;  RIGHT ANKLE ARTHROSCOPY WITH EXTENSIVE DEBRIDEMENT   ANTERIOR CERVICAL DECOMP/DISCECTOMY FUSION N/A 10/04/2016   Procedure: CERVICAL FIVE-CERVICAL SIX  ANTERIOR CERVICAL DECOMPRESSION/DISCECTOMY/FUSION;  Surgeon: Maeola Harman, MD;  Location: [facility] OR;  Service: Neurosurgery;  Laterality: N/A;  C5-6 ANTERIOR CERVICAL DECOMPRESSION/DISCECTOMY/FUSION   ANTERIOR CERVICAL DECOMP/DISCECTOMY FUSION N/A 10/04/2022   Procedure: ANTERIOR CERVICAL DISCECTOMY AND FUSION CERVICAL THREE-FOUR/CERVICAL FOUR -FIVE; REMOVAL OF HARDWARE CERVICAL FIVE-CERVICAL SEVEN;  Surgeon: Donalee Citrin, MD;  Location: [facility] OR;  Service: Neurosurgery;  Laterality: N/A;   BIOPSY  11/23/2021   Procedure: BIOPSY;  Surgeon: Charlott Rakes, MD;  Location: [facility];  Service: Endoscopy;;   COLONOSCOPY  COLONOSCOPY N/A 03/02/2014   Procedure: COLONOSCOPY;  Surgeon: Florencia Reasons, MD;  Location: [facility];  Service: Endoscopy;  Laterality: N/A;  ultra slim scope   COLONOSCOPY WITH PROPOFOL N/A 11/23/2021   Procedure: COLONOSCOPY WITH PROPOFOL;  Surgeon: Charlott Rakes, MD;  Location: [facility];  Service: Endoscopy;  Laterality: N/A;   DILATION AND CURETTAGE OF UTERUS     HEMORRHOID SURGERY      Current Medications: Current Meds  Medication Sig   acidophilus (RISAQUAD) CAPS capsule Take 1  capsule by mouth daily.   Ascorbic Acid (VITAMIN C) 1000 MG tablet Take 1,000 mg by mouth daily.   Cholecalciferol (VITAMIN D) 50 MCG (2000 UT) CAPS Take 2,000 Units by mouth daily.   cycloSPORINE (RESTASIS) 0.05 % ophthalmic emulsion Place 1 drop into both eyes daily as needed (dry eyes).   estradiol (ESTRACE) 1 MG tablet Take 1 tablet (1 mg total) by mouth daily. (Patient taking differently: Take 1 mg by mouth at bedtime.)   Multiple Vitamins-Minerals (ALIVE WOMENS ENERGY) TABS Take 1 tablet by mouth every Monday, Wednesday, and Friday.   pantoprazole (PROTONIX) 20 MG tablet Take 1 tablet (20 mg total) by mouth every morning.   rosuvastatin (CRESTOR) 20 MG tablet Take 1 tablet (20 mg total) by mouth daily.     Allergies:   Bee venom, Propoxyphene, Orange juice [orange oil], and Oxycodone   Social History   Socioeconomic History   Marital status: Divorced    Spouse name: Not on file   Number of children: 0   Years of education: Not on file   Highest education level: Not on file  Occupational History   Occupation: Environmental health practitioner  Tobacco Use   Smoking status: Never    Passive exposure: Yes   Smokeless tobacco: Never  Vaping Use   Vaping status: Never Used  Substance and Sexual Activity   Alcohol use: No    Alcohol/week: 0.0 standard drinks of alcohol   Drug use: No   Sexual activity: Not on file  Other Topics Concern   Not on file  Social History Narrative   Not on file   Social Determinants of Health   Financial Resource Strain: Not on file  Food Insecurity: Not on file  Transportation Needs: Not on file  Physical Activity: Not on file  Stress: Not on file  Social Connections: Not on file     Family History: The patient's family history includes Cancer in her father; Glaucoma in her mother; Hyperlipidemia in her mother; Hypertension in her father and mother; Obesity in her father and mother; Stroke in her father and mother.  ROS:   Review of Systems   Constitution: Negative for decreased appetite, fever and weight gain.  HENT: Negative for congestion, ear discharge, hoarse voice and sore throat.   Eyes: Negative for discharge, redness, vision loss in right eye and visual halos.  Cardiovascular: Negative for chest pain, dyspnea on exertion, leg swelling, orthopnea and palpitations.  Respiratory: Negative for cough, hemoptysis, shortness of breath and snoring.   Endocrine: Negative for heat intolerance and polyphagia.  Hematologic/Lymphatic: Negative for bleeding problem. Does not bruise/bleed easily.  Skin: Negative for flushing, nail changes, rash and suspicious lesions.  Musculoskeletal: Negative for arthritis, joint pain, muscle cramps, myalgias, neck pain and stiffness.  Gastrointestinal: Negative for abdominal pain, bowel incontinence, diarrhea and excessive appetite.  Genitourinary: Negative for decreased libido, genital sores and incomplete emptying.  Neurological: Negative for brief paralysis, focal weakness, headaches and loss of balance.  Psychiatric/Behavioral:  Negative for altered mental status, depression and suicidal ideas.  Allergic/Immunologic: Negative for HIV exposure and persistent infections.    EKGs/Labs/Other Studies Reviewed:    The following studies were reviewed today:   EKG:  The ekg ordered today demonstrates   Recent Labs: 04/05/2023: ALT 16; BUN 17; Creatinine, Ser 0.89; Hemoglobin 13.2; Platelets 130.0; Potassium 3.7; Sodium 141; TSH 0.73  Recent Lipid Panel    Component Value Date/Time   CHOL 131 04/05/2023 1323   CHOL 144 12/22/2021 1154   TRIG 50.0 04/05/2023 1323   HDL 61.50 04/05/2023 1323   HDL 64 12/22/2021 1154   CHOLHDL 2 04/05/2023 1323   VLDL 10.0 04/05/2023 1323   LDLCALC 59 04/05/2023 1323   LDLCALC 68 12/22/2021 1154    Physical Exam:    VS:  BP 124/80 (BP Location: Left Arm, Patient Position: Sitting, Cuff Size: Normal)   Pulse (!) 55   Ht 5\' 4"  (1.626 m)   Wt 198 lb 9.6 oz  (90.1 kg)   SpO2 97%   BMI 34.09 kg/m     Wt Readings from Last 3 Encounters:  07/19/23 198 lb 9.6 oz (90.1 kg)  04/15/23 192 lb (87.1 kg)  10/04/22 175 lb (79.4 kg)     GEN: Well nourished, well developed in no acute distress HEENT: Normal NECK: No JVD; No carotid bruits LYMPHATICS: No lymphadenopathy CARDIAC: S1S2 noted,RRR, no murmurs, rubs, gallops RESPIRATORY:  Clear to auscultation without rales, wheezing or rhonchi  ABDOMEN: Soft, non-tender, non-distended, +bowel sounds, no guarding. EXTREMITIES: No edema, No cyanosis, no clubbing MUSCULOSKELETAL:  No deformity  SKIN: Warm and dry NEUROLOGIC:  Alert and oriented x 3, non-focal PSYCHIATRIC:  Normal affect, good insight  ASSESSMENT:    1. Encounter to establish care    PLAN:     Chest Pain concerning for atypical angina-the symptoms chest pain is concerning, this patient does have intermediate risk for coronary artery disease and at this time I would like to pursue an ischemic evaluation in this patient.  Shared decision a coronary CTA at this time is appropriate.  I have discussed with the patient about the testing.  The patient has no IV contrast allergy and is agreeable to proceed with this test.   Hx of mitral valve prolapse and shortness of breath -Order urgent echocardiogram to assess for possible aortic valve calcification and mitral valve leakage.  Sleep Apnea Reports snoring, daytime fatigue, and unrefreshed sleep. Family history of sleep apnea. -Order home sleep study.  STOP-BANG score 6 suggesting high risk for obstructive sleep apnea.  Prediabetes History of elevated Hemoglobin A1c. -Provide information on diabetic diet to prevent progression to diabetes.  Follow-up in 6 weeks or sooner based on test results.  The patient is in agreement with the above plan. The patient left the office in stable condition.  The patient will follow up in   Medication Adjustments/Labs and Tests Ordered: Current  medicines are reviewed at length with the patient today.  Concerns regarding medicines are outlined above.  Orders Placed This Encounter  Procedures   EKG 12-Lead   No orders of the defined types were placed in this encounter.   There are no Patient Instructions on file for this visit.   Adopting a Healthy Lifestyle.  Know what a healthy weight is for you (roughly BMI <25) and aim to maintain this   Aim for 7+ servings of fruits and vegetables daily   65-80+ fluid ounces of water or unsweet tea for healthy kidneys  Limit to max 1 drink of alcohol per day; avoid smoking/tobacco   Limit animal fats in diet for cholesterol and heart health - choose grass fed whenever available   Avoid highly processed foods, and foods high in saturated/trans fats   Aim for low stress - take time to unwind and care for your mental health   Aim for 150 min of moderate intensity exercise weekly for heart health, and weights twice weekly for bone health   Aim for 7-9 hours of sleep daily   When it comes to diets, agreement about the perfect plan isnt easy to find, even among the experts. Experts at the [facility] of [employer] developed an idea known as the Healthy Eating Plate. Just imagine a plate divided into logical, healthy portions.   The emphasis is on diet quality:   Load up on vegetables and fruits - one-half of your plate: Aim for color and variety, and remember that potatoes dont count.   Go for whole grains - one-quarter of your plate: Whole wheat, barley, wheat berries, quinoa, oats, brown rice, and foods made with them. If you want pasta, go with whole wheat pasta.   Protein power - one-quarter of your plate: Fish, chicken, beans, and nuts are all healthy, versatile protein sources. Limit red meat.   The diet, however, does go beyond the plate, offering a few other suggestions.   Use healthy plant oils, such as olive, canola, soy, corn, sunflower and peanut. Check the  labels, and avoid partially hydrogenated oil, which have unhealthy trans fats.   If youre thirsty, drink water. Coffee and tea are good in moderation, but skip sugary drinks and limit milk and dairy products to one or two daily servings.   The type of carbohydrate in the diet is more important than the amount. Some sources of carbohydrates, such as vegetables, fruits, whole grains, and beans-are healthier than others.   Finally, stay active  Signed, Thomasene Ripple, DO  07/19/2023 9:09 AM    [facility]

## 2023-07-19 NOTE — Patient Instructions (Addendum)
Medication Instructions:  Your physician recommends that you continue on your current medications as directed. Please refer to the Current Medication list given to you today.  *If you need a refill on your cardiac medications before your next appointment, please call your pharmacy*   Lab Work: Your physician recommends that you have labs drawn today: BMET  If you have labs (blood work) drawn today and your tests are completely normal, you will receive your results only by: MyChart Message (if you have MyChart) OR A paper copy in the mail If you have any lab test that is abnormal or we need to change your treatment, we will call you to review the results.   Testing/Procedures: Your physician has requested that you have an echocardiogram. Echocardiography is a painless test that uses sound waves to create images of your heart. It provides your doctor with information about the size and shape of your heart and how well your heart's chambers and valves are working. This procedure takes approximately one hour. There are no restrictions for this procedure. Please do NOT wear cologne, perfume, aftershave, or lotions (deodorant is allowed). Please arrive 15 minutes prior to your appointment time. This will take place at [address] [place]    Follow-Up: At [facility], you and your health needs are our priority.  As part of our continuing mission to provide you with exceptional heart care, we have created designated Provider Care Teams.  These Care Teams include your primary Cardiologist (physician) and Advanced Practice Providers (APPs -  Physician Assistants and Nurse Practitioners) who all work together to provide you with the care you need, when you need it.  We recommend signing up for the patient portal called "MyChart".  Sign up information is provided on this After Visit Summary.  MyChart is used to connect with patients for Virtual Visits (Telemedicine).  Patients are able  to view lab/test results, encounter notes, upcoming appointments, etc.  Non-urgent messages can be sent to your provider as well.   To learn more about what you can do with MyChart, go to [email or web address].    Your next appointment:   4 month(s)  Provider:   Dr. Servando Salina   Other Instructions   Your cardiac CT will be scheduled at the below location:   [facility] [address] [place], [place] [address] [phone]   If scheduled at [facility], please arrive at the [facility] Entrance (Entrance C2) of [facility] 30 minutes prior to test start time. You can use the FREE valet parking offered at entrance C (encouraged to control the heart rate for the test)  Proceed to the [facility] Radiology Department (first floor) to check-in and test prep.  All radiology patients and guests should use entrance C2 at [facility], accessed from [facility], even though the hospital's physical address listed is [address]     Please follow these instructions carefully (unless otherwise directed):    On the Night Before the Test: Be sure to Drink plenty of water. Do not consume any caffeinated/decaffeinated beverages or chocolate 12 hours prior to your test. Do not take any antihistamines 12 hours prior to your test.   On the Day of the Test: Drink plenty of water until 1 hour prior to the test. Do not eat any food 1 hour prior to test. You may take your regular medications prior to the test.  FEMALES- please wear underwire-free bra  if available, avoid dresses & tight clothing      After the Test: Drink plenty of water. After receiving IV contrast, you may experience a mild flushed feeling. This is normal. On occasion, you may experience a mild rash up to 24 hours after the test. This is not dangerous. If this occurs, you can take Benadryl 25 mg and increase your fluid intake. If you experience trouble  breathing, this can be serious. If it is severe call 911 IMMEDIATELY. If it is mild, please call our office. If you take any of these medications: Glipizide/Metformin, Avandament, Glucavance, please do not take 48 hours after completing test unless otherwise instructed.  We will call to schedule your test 2-4 weeks out understanding that some insurance companies will need an authorization prior to the service being performed.   For more information and frequently asked questions, please visit our website : [email or web address]  For non-scheduling related questions, please contact the cardiac imaging nurse navigator should you have any questions/concerns: Cardiac Imaging Nurse Navigators Direct Office Dial: [phone]   For scheduling needs, including cancellations and rescheduling, please call [place], [phone].

## 2023-07-20 LAB — BASIC METABOLIC PANEL
BUN/Creatinine Ratio: 17 (ref 12–28)
BUN: 14 mg/dL (ref 8–27)
CO2: 24 mmol/L (ref 20–29)
Calcium: 9.5 mg/dL (ref 8.7–10.3)
Chloride: 107 mmol/L — ABNORMAL HIGH (ref 96–106)
Creatinine, Ser: 0.82 mg/dL (ref 0.57–1.00)
Glucose: 89 mg/dL (ref 70–99)
Potassium: 4.3 mmol/L (ref 3.5–5.2)
Sodium: 143 mmol/L (ref 134–144)
eGFR: 82 mL/min/{1.73_m2} (ref >59)

## 2023-07-22 ENCOUNTER — Telehealth: Payer: Self-pay

## 2023-07-22 NOTE — Telephone Encounter (Signed)
**Note De-Identified Brindle Leyba Obfuscation** Ordering provider: Dr Servando Salina Associated diagnoses: Snoring-R06.83 and Non Restorative sleep-G47.3 WatchPAT PA obtained on 07/22/2023 by Jerrica Thorman, Lorelle Formosa, LPN Malvern Kadlec telephone call with [facility] virtual assistant: No PA required for CPT Code: 52841 Patient notified of PIN (1234) on 07/22/2023 Bryleigh Ottaway Notification Method: phone. I did request that she do her HST within 2 weeks, if possible and she stated that she would.  Phone note routed to covering staff for follow-up.

## 2023-07-30 ENCOUNTER — Encounter (HOSPITAL_COMMUNITY): Payer: Self-pay

## 2023-07-30 ENCOUNTER — Telehealth: Payer: Self-pay | Admitting: Cardiology

## 2023-07-30 NOTE — Telephone Encounter (Signed)
Patient would like assistance with registering for home sleep study.

## 2023-08-01 ENCOUNTER — Ambulatory Visit (HOSPITAL_COMMUNITY)
Admission: RE | Admit: 2023-08-01 | Discharge: 2023-08-01 | Disposition: A | Discharge status: Home / Self Care | Payer: Commercial Managed Care - PPO | Source: Ambulatory Visit | Attending: Cardiology | Admitting: Cardiology

## 2023-08-01 DIAGNOSIS — R072 Precordial pain: Secondary | ICD-10-CM | POA: Insufficient documentation

## 2023-08-01 MED ORDER — NITROGLYCERIN 0.4 MG SL SUBL
0.8000 mg | SUBLINGUAL_TABLET | Freq: Once | SUBLINGUAL | Status: AC
  Administered 2023-08-01: 0.8 mg via SUBLINGUAL

## 2023-08-01 MED ORDER — IOHEXOL 350 MG/ML SOLN
95.0000 mL | Freq: Once | INTRAVENOUS | Status: AC | PRN
  Administered 2023-08-01: 95 mL via INTRAVENOUS

## 2023-08-01 MED ORDER — NITROGLYCERIN 0.4 MG SL SUBL
SUBLINGUAL_TABLET | SUBLINGUAL | Status: AC
  Filled 2023-08-01: qty 2

## 2023-08-02 ENCOUNTER — Encounter: Payer: Self-pay | Admitting: Cardiology

## 2023-08-05 ENCOUNTER — Ambulatory Visit (HOSPITAL_COMMUNITY): Payer: Commercial Managed Care - PPO

## 2023-08-05 ENCOUNTER — Telehealth: Payer: Self-pay

## 2023-08-05 ENCOUNTER — Encounter (INDEPENDENT_AMBULATORY_CARE_PROVIDER_SITE_OTHER): Payer: Self-pay | Admitting: Cardiology

## 2023-08-05 DIAGNOSIS — G4733 Obstructive sleep apnea (adult) (pediatric): Secondary | ICD-10-CM

## 2023-08-05 NOTE — Telephone Encounter (Signed)
Received below message in regards to this pt: Just FYI:  Instructions for covering staff:  1. Please contact patient in 2 weeks if WatchPAT study results are not available yet. Remind patient to complete test.  2. If patient declines to proceed with test, please confirm that box is unopened and remind patient to return it to the office within 30 days. Route phone note to [facility] SLEEP STUDIES pool for tracking.  3. If box has been opened, please route phone note to Erskine Squibb (billing department).   Called pt to enquire about the WatchPAT. Pt tells me about her issues with the watch. Pt came to the office today, "they said it should be fixed. I will try to do it again tonight." PT thanked me for calling to check in.

## 2023-08-08 ENCOUNTER — Other Ambulatory Visit (HOSPITAL_COMMUNITY): Payer: Self-pay

## 2023-08-13 ENCOUNTER — Telehealth: Payer: Self-pay

## 2023-08-13 ENCOUNTER — Ambulatory Visit: Payer: Commercial Managed Care - PPO | Attending: Cardiology

## 2023-08-13 DIAGNOSIS — R072 Precordial pain: Secondary | ICD-10-CM

## 2023-08-13 DIAGNOSIS — I341 Nonrheumatic mitral (valve) prolapse: Secondary | ICD-10-CM

## 2023-08-13 DIAGNOSIS — Z7689 Persons encountering health services in other specified circumstances: Secondary | ICD-10-CM

## 2023-08-13 NOTE — Telephone Encounter (Signed)
-----   Message from Armanda Magic sent at 08/13/2023  4:46 AM EDT ----- Please let patient know that they have sleep apnea and recommend treating with CPAP.  Please order an auto CPAP from 4-15cm H2O with heated humidity and mask of choice.  Order overnight pulse ox on CPAP.  Followup with me in 6 weeks.

## 2023-08-13 NOTE — Procedures (Signed)
SLEEP STUDY REPORT Patient Information Study Date: 08/05/2023 Patient Name: Jillian Andrade Patient ID: [id number] Birth Date: [birth year] Age: 62 Gender: Female BMI: 33.9 (W=198 lb, H=5' 4'') Referring Physician: Thomasene Ripple, DO  TEST DESCRIPTION: Home sleep apnea testing was completed using the WatchPat, a Type 1 device, utilizing peripheral arterial tonometry (PAT), chest movement, actigraphy, pulse oximetry, pulse rate, body position and snore. AHI was calculated with apnea and hypopnea using valid sleep time as the denominator. RDI includes apneas, hypopneas, and RERAs. The data acquired and the scoring of sleep and all associated events were performed in accordance with the recommended standards and specifications as outlined in the AASM Manual for the Scoring of Sleep and Associated Events 2.2.0 (2015).  FINDINGS:  1. Mild Obstructive Sleep Apnea with AHI 7.8/hr overall and moderate during REM sleep with REM AHI 16.3/hr.  2. No Central Sleep Apnea with pAHIc 0/hr.  3. Oxygen desaturations as low as 85%.  4. Mild snoring was present. O2 sats were < 88% for 0.7 min.  5. Total sleep time was 6 hrs and 34 min.  6. 29.3% of total sleep time was spent in REM sleep.  7. Normal sleep onset latency at 17 min.  8. Shortened REM sleep onset latency at 25 min.  9. Total awakenings were 6. 10. Arrhythmia detection: None  DIAGNOSIS: Mild to Moderate Obstructive Sleep Apnea (G47.33)  RECOMMENDATIONS: 1. Clinical correlation of these findings is necessary. The decision to treat obstructive sleep apnea (OSA) is usually based on the presence of apnea symptoms or the presence of associated medical conditions such as Hypertension, Congestive Heart Failure, Atrial Fibrillation or Obesity. The most common symptoms of OSA are snoring, gasping for breath while sleeping, daytime sleepiness and fatigue.  2. Initiating apnea therapy is recommended given the presence of symptoms and/or associated  conditions. Recommend proceeding with one of the following:   a. Auto-CPAP therapy with a pressure range of 5-20cm H2O.   b. An oral appliance (OA) that can be obtained from certain dentists with expertise in sleep medicine. These are primarily of use in non-obese patients with mild and moderate disease.   c. An ENT consultation which may be useful to look for specific causes of obstruction and possible treatment options.   d. If patient is intolerant to PAP therapy, consider referral to ENT for evaluation for hypoglossal nerve stimulator.  3. Close follow-up is necessary to ensure success with CPAP or oral appliance therapy for maximum benefit .  4. A follow-up oximetry study on CPAP is recommended to assess the adequacy of therapy and determine the need for supplemental oxygen or the potential need for Bi-level therapy. An arterial blood gas to determine the adequacy of baseline ventilation and oxygenation should also be considered.  5. Healthy sleep recommendations include: adequate nightly sleep (normal 7-9 hrs/night), avoidance of caffeine after noon and alcohol near bedtime, and maintaining a sleep environment that is cool, dark and quiet.  6. Weight loss for overweight patients is recommended. Even modest amounts of weight loss can significantly improve the severity of sleep apnea.  7. Snoring recommendations include: weight loss where appropriate, side sleeping, and avoidance of alcohol before bed.  8. Operation of motor vehicle should be avoided when sleepy.  Signature: Armanda Magic, MD; [facility]; Diplomat, American Board of Sleep Medicine Electronically Signed: 08/13/2023 4:44:46 AM

## 2023-08-13 NOTE — Telephone Encounter (Signed)
Left VM with callback number for sleep study results and recommendations.

## 2023-08-30 ENCOUNTER — Other Ambulatory Visit (HOSPITAL_COMMUNITY): Payer: Self-pay

## 2023-08-30 DIAGNOSIS — Z6835 Body mass index (BMI) 35.0-35.9, adult: Secondary | ICD-10-CM | POA: Diagnosis not present

## 2023-08-30 DIAGNOSIS — Z01419 Encounter for gynecological examination (general) (routine) without abnormal findings: Secondary | ICD-10-CM | POA: Diagnosis not present

## 2023-08-30 DIAGNOSIS — Z1272 Encounter for screening for malignant neoplasm of vagina: Secondary | ICD-10-CM | POA: Diagnosis not present

## 2023-08-30 DIAGNOSIS — N959 Unspecified menopausal and perimenopausal disorder: Secondary | ICD-10-CM | POA: Diagnosis not present

## 2023-08-30 MED ORDER — ESTRADIOL 1 MG PO TABS
1.0000 mg | ORAL_TABLET | Freq: Every day | ORAL | 3 refills | Status: DC
  Filled 2023-08-30 – 2023-09-16 (×3): qty 90, 90d supply, fill #0
  Filled 2023-12-15: qty 90, 90d supply, fill #1
  Filled 2024-03-13: qty 90, 90d supply, fill #2
  Filled 2024-06-12: qty 90, 90d supply, fill #3

## 2023-09-02 ENCOUNTER — Other Ambulatory Visit: Payer: Self-pay

## 2023-09-16 ENCOUNTER — Other Ambulatory Visit (HOSPITAL_COMMUNITY): Payer: Self-pay

## 2024-03-16 ENCOUNTER — Other Ambulatory Visit (HOSPITAL_COMMUNITY): Payer: Self-pay

## 2024-03-30 ENCOUNTER — Other Ambulatory Visit (HOSPITAL_COMMUNITY): Payer: Self-pay

## 2024-04-20 ENCOUNTER — Other Ambulatory Visit: Payer: Self-pay | Admitting: General Surgery

## 2024-04-20 DIAGNOSIS — R9230 Dense breasts, unspecified: Secondary | ICD-10-CM | POA: Diagnosis not present

## 2024-04-20 DIAGNOSIS — Z803 Family history of malignant neoplasm of breast: Secondary | ICD-10-CM | POA: Diagnosis not present

## 2024-04-20 DIAGNOSIS — Z801 Family history of malignant neoplasm of trachea, bronchus and lung: Secondary | ICD-10-CM | POA: Insufficient documentation

## 2024-04-20 DIAGNOSIS — N644 Mastodynia: Secondary | ICD-10-CM

## 2024-05-01 ENCOUNTER — Other Ambulatory Visit (HOSPITAL_COMMUNITY): Payer: Self-pay

## 2024-05-01 ENCOUNTER — Other Ambulatory Visit: Payer: Self-pay | Admitting: Internal Medicine

## 2024-05-01 MED ORDER — ROSUVASTATIN CALCIUM 20 MG PO TABS
20.0000 mg | ORAL_TABLET | Freq: Every day | ORAL | 0 refills | Status: DC
  Filled 2024-05-01: qty 90, 90d supply, fill #0

## 2024-05-11 ENCOUNTER — Other Ambulatory Visit (HOSPITAL_COMMUNITY): Payer: Self-pay

## 2024-05-11 MED ORDER — AMOXICILLIN 500 MG PO CAPS
2000.0000 mg | ORAL_CAPSULE | ORAL | 2 refills | Status: AC
  Filled 2024-05-11: qty 12, 3d supply, fill #0

## 2024-05-18 ENCOUNTER — Ambulatory Visit
Admission: RE | Admit: 2024-05-18 | Discharge: 2024-05-18 | Disposition: A | Discharge status: Home / Self Care | Source: Ambulatory Visit | Attending: General Surgery | Admitting: General Surgery

## 2024-05-18 ENCOUNTER — Ambulatory Visit: Payer: Self-pay | Admitting: General Surgery

## 2024-05-18 DIAGNOSIS — Z803 Family history of malignant neoplasm of breast: Secondary | ICD-10-CM

## 2024-05-18 DIAGNOSIS — Z1239 Encounter for other screening for malignant neoplasm of breast: Secondary | ICD-10-CM | POA: Diagnosis not present

## 2024-05-18 DIAGNOSIS — R9230 Dense breasts, unspecified: Secondary | ICD-10-CM

## 2024-05-18 DIAGNOSIS — N644 Mastodynia: Secondary | ICD-10-CM | POA: Diagnosis not present

## 2024-05-18 MED ORDER — GADOPICLENOL 0.5 MMOL/ML IV SOLN: 10.0000 mL | Freq: Once | INTRAVENOUS | Status: DC | PRN

## 2024-05-18 MED ORDER — GADOPICLENOL 0.5 MMOL/ML IV SOLN
10.0000 mL | Freq: Once | INTRAVENOUS | Status: AC | PRN
  Administered 2024-05-18: 10 mL via INTRAVENOUS

## 2024-06-26 ENCOUNTER — Other Ambulatory Visit: Payer: Self-pay | Admitting: Internal Medicine

## 2024-06-29 ENCOUNTER — Other Ambulatory Visit (HOSPITAL_COMMUNITY): Payer: Self-pay

## 2024-06-29 MED ORDER — PANTOPRAZOLE SODIUM 20 MG PO TBEC
20.0000 mg | DELAYED_RELEASE_TABLET | Freq: Every morning | ORAL | 0 refills | Status: DC
  Filled 2024-06-29: qty 90, 90d supply, fill #0

## 2024-06-30 ENCOUNTER — Other Ambulatory Visit (HOSPITAL_COMMUNITY): Payer: Self-pay

## 2024-07-20 DIAGNOSIS — H43813 Vitreous degeneration, bilateral: Secondary | ICD-10-CM | POA: Diagnosis not present

## 2024-07-20 DIAGNOSIS — Z961 Presence of intraocular lens: Secondary | ICD-10-CM | POA: Diagnosis not present

## 2024-07-20 DIAGNOSIS — H04123 Dry eye syndrome of bilateral lacrimal glands: Secondary | ICD-10-CM | POA: Diagnosis not present

## 2024-07-20 DIAGNOSIS — H26491 Other secondary cataract, right eye: Secondary | ICD-10-CM | POA: Diagnosis not present

## 2024-07-27 ENCOUNTER — Other Ambulatory Visit: Payer: Self-pay | Admitting: Internal Medicine

## 2024-07-28 ENCOUNTER — Encounter (HOSPITAL_COMMUNITY): Payer: Self-pay

## 2024-07-28 ENCOUNTER — Other Ambulatory Visit (HOSPITAL_COMMUNITY): Payer: Self-pay

## 2024-07-28 ENCOUNTER — Telehealth: Payer: Self-pay

## 2024-07-28 NOTE — Telephone Encounter (Signed)
 Copied from CRM [id number]. Topic: Clinical - Request for Lab/Test Order >> Jul 28, 2024  9:09 AM Laymon HERO wrote: Reason for CRM: patient requesting lab work to be done before physical appt on 11/21

## 2024-07-28 NOTE — Telephone Encounter (Signed)
 Orders are already in

## 2024-07-30 ENCOUNTER — Other Ambulatory Visit: Payer: Self-pay

## 2024-07-30 ENCOUNTER — Other Ambulatory Visit (HOSPITAL_COMMUNITY): Payer: Self-pay

## 2024-07-30 ENCOUNTER — Encounter: Payer: Self-pay | Admitting: Internal Medicine

## 2024-07-30 MED ORDER — ROSUVASTATIN CALCIUM 20 MG PO TABS
20.0000 mg | ORAL_TABLET | Freq: Every day | ORAL | 0 refills | Status: DC
  Filled 2024-07-30: qty 90, 90d supply, fill #0

## 2024-08-31 ENCOUNTER — Other Ambulatory Visit

## 2024-08-31 DIAGNOSIS — E559 Vitamin D deficiency, unspecified: Secondary | ICD-10-CM

## 2024-08-31 DIAGNOSIS — E538 Deficiency of other specified B group vitamins: Secondary | ICD-10-CM

## 2024-08-31 DIAGNOSIS — E7800 Pure hypercholesterolemia, unspecified: Secondary | ICD-10-CM | POA: Diagnosis not present

## 2024-08-31 DIAGNOSIS — R7303 Prediabetes: Secondary | ICD-10-CM | POA: Diagnosis not present

## 2024-08-31 LAB — URINALYSIS, ROUTINE W REFLEX MICROSCOPIC
Bilirubin Urine: NEGATIVE
Hgb urine dipstick: NEGATIVE
Ketones, ur: NEGATIVE
Leukocytes,Ua: NEGATIVE
Nitrite: NEGATIVE
Specific Gravity, Urine: 1.02 (ref 1.000–1.030)
Total Protein, Urine: NEGATIVE
Urine Glucose: NEGATIVE
Urobilinogen, UA: 0.2 (ref 0.0–1.0)
pH: 6 (ref 5.0–8.0)

## 2024-08-31 LAB — LIPID PANEL
Cholesterol: 112 mg/dL (ref 0–200)
HDL: 52.1 mg/dL (ref >39.00)
LDL Cholesterol: 49 mg/dL (ref 0–99)
NonHDL: 59.8
Total CHOL/HDL Ratio: 2
Triglycerides: 52 mg/dL (ref 0.0–149.0)
VLDL: 10.4 mg/dL (ref 0.0–40.0)

## 2024-08-31 LAB — CBC WITH DIFFERENTIAL/PLATELET
Basophils Absolute: 0 K/uL (ref 0.0–0.1)
Basophils Relative: 0.3 % (ref 0.0–3.0)
Eosinophils Absolute: 0.1 K/uL (ref 0.0–0.7)
Eosinophils Relative: 1.3 % (ref 0.0–5.0)
HCT: 39.2 % (ref 36.0–46.0)
Hemoglobin: 13.1 g/dL (ref 12.0–15.0)
Lymphocytes Relative: 23.4 % (ref 12.0–46.0)
Lymphs Abs: 1.4 K/uL (ref 0.7–4.0)
MCHC: 33.5 g/dL (ref 30.0–36.0)
MCV: 88.6 fl (ref 78.0–100.0)
Monocytes Absolute: 0.4 K/uL (ref 0.1–1.0)
Monocytes Relative: 6.2 % (ref 3.0–12.0)
Neutro Abs: 4 K/uL (ref 1.4–7.7)
Neutrophils Relative %: 68.8 % (ref 43.0–77.0)
Platelets: 111 K/uL — ABNORMAL LOW (ref 150.0–400.0)
RBC: 4.43 Mil/uL (ref 3.87–5.11)
RDW: 13.7 % (ref 11.5–15.5)
WBC: 5.8 K/uL (ref 4.0–10.5)

## 2024-08-31 LAB — HEPATIC FUNCTION PANEL
ALT: 15 U/L (ref 0–35)
AST: 19 U/L (ref 0–37)
Albumin: 4.2 g/dL (ref 3.5–5.2)
Alkaline Phosphatase: 50 U/L (ref 39–117)
Bilirubin, Direct: 0.1 mg/dL (ref 0.0–0.3)
Total Bilirubin: 0.5 mg/dL (ref 0.2–1.2)
Total Protein: 6.7 g/dL (ref 6.0–8.3)

## 2024-08-31 LAB — MICROALBUMIN / CREATININE URINE RATIO
Creatinine,U: 115.3 mg/dL
Microalb Creat Ratio: UNDETERMINED mg/g (ref 0.0–30.0)
Microalb, Ur: <0.7 mg/dL

## 2024-08-31 LAB — HEMOGLOBIN A1C: Hgb A1c MFr Bld: 6.1 % (ref 4.6–6.5)

## 2024-08-31 LAB — TSH: TSH: 0.78 u[IU]/mL (ref 0.35–5.50)

## 2024-08-31 LAB — VITAMIN B12: Vitamin B-12: 735 pg/mL (ref 211–911)

## 2024-08-31 LAB — VITAMIN D 25 HYDROXY (VIT D DEFICIENCY, FRACTURES): VITD: 72.08 ng/mL (ref 30.00–100.00)

## 2024-09-04 ENCOUNTER — Ambulatory Visit (INDEPENDENT_AMBULATORY_CARE_PROVIDER_SITE_OTHER): Admitting: Internal Medicine

## 2024-09-04 ENCOUNTER — Other Ambulatory Visit (HOSPITAL_COMMUNITY): Payer: Self-pay

## 2024-09-04 ENCOUNTER — Encounter: Payer: Self-pay | Admitting: Internal Medicine

## 2024-09-04 VITALS — BP 128/72 | HR 50 | Temp 98.1°F | Ht 64.0 in | Wt 192.0 lb

## 2024-09-04 DIAGNOSIS — M25562 Pain in left knee: Secondary | ICD-10-CM | POA: Insufficient documentation

## 2024-09-04 DIAGNOSIS — Z01419 Encounter for gynecological examination (general) (routine) without abnormal findings: Secondary | ICD-10-CM | POA: Diagnosis not present

## 2024-09-04 DIAGNOSIS — E7800 Pure hypercholesterolemia, unspecified: Secondary | ICD-10-CM | POA: Diagnosis not present

## 2024-09-04 DIAGNOSIS — E538 Deficiency of other specified B group vitamins: Secondary | ICD-10-CM | POA: Diagnosis not present

## 2024-09-04 DIAGNOSIS — Z0000 Encounter for general adult medical examination without abnormal findings: Secondary | ICD-10-CM

## 2024-09-04 DIAGNOSIS — N959 Unspecified menopausal and perimenopausal disorder: Secondary | ICD-10-CM | POA: Diagnosis not present

## 2024-09-04 DIAGNOSIS — R09A2 Foreign body sensation, throat: Secondary | ICD-10-CM | POA: Insufficient documentation

## 2024-09-04 DIAGNOSIS — E559 Vitamin D deficiency, unspecified: Secondary | ICD-10-CM

## 2024-09-04 DIAGNOSIS — Z6833 Body mass index (BMI) 33.0-33.9, adult: Secondary | ICD-10-CM | POA: Diagnosis not present

## 2024-09-04 DIAGNOSIS — R7303 Prediabetes: Secondary | ICD-10-CM

## 2024-09-04 DIAGNOSIS — Z1272 Encounter for screening for malignant neoplasm of vagina: Secondary | ICD-10-CM | POA: Diagnosis not present

## 2024-09-04 DIAGNOSIS — R1313 Dysphagia, pharyngeal phase: Secondary | ICD-10-CM | POA: Insufficient documentation

## 2024-09-04 MED ORDER — ROSUVASTATIN CALCIUM 20 MG PO TABS
20.0000 mg | ORAL_TABLET | Freq: Every day | ORAL | 3 refills | Status: AC
  Filled 2024-09-04 – 2024-10-26 (×2): qty 90, 90d supply, fill #0

## 2024-09-04 MED ORDER — PANTOPRAZOLE SODIUM 20 MG PO TBEC
20.0000 mg | DELAYED_RELEASE_TABLET | Freq: Every morning | ORAL | 3 refills | Status: AC
  Filled 2024-09-04 – 2024-09-25 (×2): qty 90, 90d supply, fill #0

## 2024-09-04 MED ORDER — ESTRADIOL 1 MG PO TABS
1.0000 mg | ORAL_TABLET | Freq: Every day | ORAL | 3 refills | Status: AC
Start: 2024-09-04 — End: ?
  Filled 2024-09-04: qty 90, 90d supply, fill #0

## 2024-09-04 NOTE — Assessment & Plan Note (Signed)
 Mild to mod, for otc volt gel prn, consider sport medicine for any wrosening,  to f/u any worsening symptoms or concerns

## 2024-09-04 NOTE — Assessment & Plan Note (Signed)
 Age and sex appropriate education and counseling updated with regular exercise and diet Referrals for preventative services - none needed Immunizations addressed - declines prevnar Smoking counseling  - none needed Evidence for depression or other mood disorder - none significant Most recent labs reviewed. I have personally reviewed and have noted: 1) the patient's medical and social history 2) The patient's current medications and supplements 3) The patient's height, weight, and BMI have been recorded in the chart

## 2024-09-04 NOTE — Patient Instructions (Signed)
 Please continue all other medications as before, and refills have been done if requested.  Please have the pharmacy call with any other refills you may need.  Please continue your efforts at being more active, low cholesterol diet, and weight control.  You are otherwise up to date with prevention measures today.  Please keep your appointments with your specialists as you may have planned  Your lab work was good!  Please consider seeing Sports Medicine on the first floor if the knees get worse  Please make an Appointment to return for your 1 year visit, or sooner if needed, with Lab testing by Appointment as well, to be done about 3-5 days before at the FIRST FLOOR Lab (so this is for TWO appointments - please see the scheduling desk as you leave)

## 2024-09-04 NOTE — Assessment & Plan Note (Signed)
 Last vitamin D  Lab Results  Component Value Date   VD25OH 72.08 08/31/2024   Stable, cont oral replacement

## 2024-09-04 NOTE — Assessment & Plan Note (Signed)
 Lab Results  Component Value Date   HGBA1C 6.1 08/31/2024   Stable, pt to continue current medical treatment  - diet, wt control

## 2024-09-04 NOTE — Progress Notes (Signed)
 Patient ID: NEZZIE MANERA, female   DOB: November 09, 1960, 63 y.o.   MRN: [id number]         Chief Complaint:: wellness exam and left knee pain, low vit d, preDM, hld, low b12       HPI:  Jillian Andrade is a 63 y.o. female here for wellness exam; declines prevnar, o/w up to date                        Also Pt denies chest pain, increased sob or doe, wheezing, orthopnea, PND, increased LE swelling, palpitations, dizziness or syncope.   Pt denies polydipsia, polyuria, or new focal neuro s/s.    Pt denies fever, wt loss, night sweats, loss of appetite, or other constitutional symptoms  Does have mild worsening left knee pain laterally and tendency for giveaway but no falls.      Wt Readings from Last 3 Encounters:  09/04/24 192 lb (87.1 kg)  07/19/23 198 lb 9.6 oz (90.1 kg)  04/15/23 192 lb (87.1 kg)   BP Readings from Last 3 Encounters:  09/04/24 128/72  08/01/23 (!) 154/85  07/19/23 124/80   Immunization History  Administered Date(s) Administered   Influenza,inj,Quad PF,6+ Mos 07/18/2015   Influenza-Unspecified 07/12/2017, 07/28/2018, 08/07/2020, 07/31/2021, 08/03/2024   PFIZER(Purple Top)SARS-COV-2 Vaccination 10/24/2019, 11/12/2019, 08/12/2020   Tdap 04/12/2015   Zoster Recombinant(Shingrix ) 09/29/2021, 12/08/2021   There are no preventive care reminders to display for this patient.     Past Medical History:  Diagnosis Date   Allergic rhinitis 03/06/2019   Anemia    Bilateral swelling of feet    Chest pain    Complication of anesthesia    n/v   Constipation    Epilepsy (HCC)    Fibroids in ovaries    pain in left side   GERD (gastroesophageal reflux disease)    no medications at this time   Hand osteomyelitis (HCC) 03/06/2019   Heart murmur    back in the 90s with the mitral valve prolapse. Taysen Bushart, APP, listened to heart sounds 09/24/22- no murmur auscultated   High cholesterol    Insomnia 05/01/2016   Lactose intolerance    Lumbar disc disease 05/01/2016   Multiple  food allergies    MVP (mitral valve prolapse) mild, no cardiologist   No pertinent past medical history    PONV (postoperative nausea and vomiting)    Renal cyst, right 2022   SOB (shortness of breath)    Swallowing difficulty    Vitamin D  deficiency    Past Surgical History:  Procedure Laterality Date   ABDOMINAL HYSTERECTOMY     ANKLE ARTHROSCOPY  10/31/2011   Procedure: ANKLE ARTHROSCOPY;  Surgeon: Deward DELENA Schwartz, MD;  Location: [facility];  Service: Orthopedics;  Laterality: Right;  RIGHT ANKLE ARTHROSCOPY WITH EXTENSIVE DEBRIDEMENT   ANTERIOR CERVICAL DECOMP/DISCECTOMY FUSION N/A 10/04/2016   Procedure: CERVICAL FIVE-CERVICAL SIX  ANTERIOR CERVICAL DECOMPRESSION/DISCECTOMY/FUSION;  Surgeon: Fairy Levels, MD;  Location: [facility] OR;  Service: Neurosurgery;  Laterality: N/A;  C5-6 ANTERIOR CERVICAL DECOMPRESSION/DISCECTOMY/FUSION   ANTERIOR CERVICAL DECOMP/DISCECTOMY FUSION N/A 10/04/2022   Procedure: ANTERIOR CERVICAL DISCECTOMY AND FUSION CERVICAL THREE-FOUR/CERVICAL FOUR -FIVE; REMOVAL OF HARDWARE CERVICAL FIVE-CERVICAL SEVEN;  Surgeon: Onetha Kuba, MD;  Location: [facility] OR;  Service: Neurosurgery;  Laterality: N/A;   BIOPSY  11/23/2021   Procedure: BIOPSY;  Surgeon: Dianna Specking, MD;  Location: [facility];  Service: Endoscopy;;   COLONOSCOPY     COLONOSCOPY N/A 03/02/2014   Procedure:  COLONOSCOPY;  Surgeon: Lamar LULLA Bunk, MD;  Location: [facility];  Service: Endoscopy;  Laterality: N/A;  ultra slim scope   COLONOSCOPY WITH PROPOFOL  N/A 11/23/2021   Procedure: COLONOSCOPY WITH PROPOFOL ;  Surgeon: Dianna Specking, MD;  Location: [facility];  Service: Endoscopy;  Laterality: N/A;   DILATION AND CURETTAGE OF UTERUS     HEMORRHOID SURGERY      reports that she has never smoked. She has been exposed to tobacco smoke. She has never used smokeless tobacco. She reports that she does not drink alcohol and does not use drugs. family history includes Cancer in her father;  Glaucoma in her mother; Hyperlipidemia in her mother; Hypertension in her father and mother; Obesity in her father and mother; Stroke in her father and mother. Allergies  Allergen Reactions   Bee Venom Anaphylaxis   Acetaminophen  Other (See Comments)    acetaminophen    Propoxyphene Nausea And Vomiting    Allergy to Wygesic   Orange Juice [Orange Oil] Hives and Nausea And Vomiting   Oxycodone Hives   Current Outpatient Medications on File Prior to Visit  Medication Sig Dispense Refill   acidophilus (RISAQUAD) CAPS capsule Take 1 capsule by mouth daily.     amoxicillin  (AMOXIL ) 500 MG capsule Take 4 capsules (2,000 mg total) by mouth 1 hour prior to appointment 12 capsule 2   Ascorbic Acid  (VITAMIN C ) 1000 MG tablet Take 1,000 mg by mouth daily.     Cholecalciferol  (VITAMIN D ) 50 MCG (2000 UT) CAPS Take 2,000 Units by mouth daily.     cycloSPORINE  (RESTASIS ) 0.05 % ophthalmic emulsion Place 1 drop into both eyes daily as needed (dry eyes).     Multiple Vitamins-Minerals (ALIVE WOMENS ENERGY) TABS Take 1 tablet by mouth every Monday, Wednesday, and Friday.     No current facility-administered medications on file prior to visit.        ROS:  All others reviewed and negative.  Objective        PE:  BP 128/72 (BP Location: Right Arm, Patient Position: Sitting, Cuff Size: Normal)   Pulse (!) 50   Temp 98.1 F (36.7 C) (Oral)   Ht 5' 4 (1.626 m)   Wt 192 lb (87.1 kg)   SpO2 99%   BMI 32.96 kg/m                 Constitutional: Pt appears in NAD               HENT: Head: NCAT.                Right Ear: External ear normal.                 Left Ear: External ear normal.                Eyes: . Pupils are equal, round, and reactive to light. Conjunctivae and EOM are normal               Nose: without d/c or deformity               Neck: Neck supple. Gross normal ROM               Cardiovascular: Normal rate and regular rhythm.                 Pulmonary/Chest: Effort normal and breath  sounds without rales or wheezing.  Abd:  Soft, NT, ND, + BS, no organomegaly               Neurological: Pt is alert. At baseline orientation, motor grossly intact               Skin: Skin is warm. No rashes, no other new lesions, LE edema - none; left knee with mild bony degenerative change, no effusion               Psychiatric: Pt behavior is normal without agitation   Micro: none  Cardiac tracings I have personally interpreted today:  none  Pertinent Radiological findings (summarize): none   Lab Results  Component Value Date   WBC 5.8 08/31/2024   HGB 13.1 08/31/2024   HCT 39.2 08/31/2024   PLT 111.0 (L) 08/31/2024   GLUCOSE 89 07/19/2023   CHOL 112 08/31/2024   TRIG 52.0 08/31/2024   HDL 52.10 08/31/2024   LDLCALC 49 08/31/2024   ALT 15 08/31/2024   AST 19 08/31/2024   NA 143 07/19/2023   K 4.3 07/19/2023   CL 107 (H) 07/19/2023   CREATININE 0.82 07/19/2023   BUN 14 07/19/2023   CO2 24 07/19/2023   TSH 0.78 08/31/2024   HGBA1C 6.1 08/31/2024   MICROALBUR <0.7 08/31/2024   Assessment/Plan:  IDELLE REIMANN is a 63 y.o. Black or African American [2] female with  has a past medical history of Allergic rhinitis (03/06/2019), Anemia, Bilateral swelling of feet, Chest pain, Complication of anesthesia, Constipation, Epilepsy (HCC), Fibroids (in ovaries ), GERD (gastroesophageal reflux disease), Hand osteomyelitis (HCC) (03/06/2019), Heart murmur, High cholesterol, Insomnia (05/01/2016), Lactose intolerance, Lumbar disc disease (05/01/2016), Multiple food allergies, MVP (mitral valve prolapse) (mild, no cardiologist), No pertinent past medical history, PONV (postoperative nausea and vomiting), Renal cyst, right (2022), SOB (shortness of breath), Swallowing difficulty, and Vitamin D  deficiency.  Preventative health care Age and sex appropriate education and counseling updated with regular exercise and diet Referrals for preventative services - none  needed Immunizations addressed - declines prevnar Smoking counseling  - none needed Evidence for depression or other mood disorder - none significant Most recent labs reviewed. I have personally reviewed and have noted: 1) the patient's medical and social history 2) The patient's current medications and supplements 3) The patient's height, weight, and BMI have been recorded in the chart   Vitamin D  deficiency Last vitamin D  Lab Results  Component Value Date   VD25OH 72.08 08/31/2024   Stable, cont oral replacement   Prediabetes Lab Results  Component Value Date   HGBA1C 6.1 08/31/2024   Stable, pt to continue current medical treatment  - diet, wt control   HLD (hyperlipidemia) Lab Results  Component Value Date   LDLCALC 49 08/31/2024   Stable, pt to continue current statin crestor  20 mg qd   B12 deficiency Lab Results  Component Value Date   VITAMINB12 735 08/31/2024   Stable, cont oral replacement - b12 1000 mcg qd   Left knee pain Mild to mod, for otc volt gel prn, consider sport medicine for any wrosening,  to f/u any worsening symptoms or concerns  Followup: Return in about 1 year (around 09/04/2025).  Lynwood Rush, MD 09/04/2024 9:39 PM [facility]

## 2024-09-04 NOTE — Assessment & Plan Note (Signed)
 Lab Results  Component Value Date   VITAMINB12 735 08/31/2024   Stable, cont oral replacement - b12 1000 mcg qd

## 2024-09-04 NOTE — Assessment & Plan Note (Signed)
 Lab Results  Component Value Date   LDLCALC 49 08/31/2024   Stable, pt to continue current statin crestor  20 mg qd

## 2024-09-25 ENCOUNTER — Other Ambulatory Visit (HOSPITAL_COMMUNITY): Payer: Self-pay

## 2024-10-26 ENCOUNTER — Other Ambulatory Visit: Payer: Self-pay

## 2024-10-26 ENCOUNTER — Other Ambulatory Visit (HOSPITAL_COMMUNITY): Payer: Self-pay

## 2024-10-26 ENCOUNTER — Encounter (HOSPITAL_COMMUNITY): Payer: Self-pay
